# Patient Record
Sex: Female | Born: 1975
Health system: Southern US, Community
[De-identification: ages and names within clinical notes are randomized; demographics above are authoritative.]

## PROBLEM LIST (undated history)

## (undated) ENCOUNTER — Emergency Department (HOSPITAL_COMMUNITY): Disposition: A | Discharge status: Home / Self Care | Payer: Self-pay

## (undated) DIAGNOSIS — C4490 Unspecified malignant neoplasm of skin, unspecified: Secondary | ICD-10-CM

## (undated) DIAGNOSIS — F419 Anxiety disorder, unspecified: Secondary | ICD-10-CM

## (undated) DIAGNOSIS — K760 Fatty (change of) liver, not elsewhere classified: Secondary | ICD-10-CM

## (undated) DIAGNOSIS — F1010 Alcohol abuse, uncomplicated: Secondary | ICD-10-CM

## (undated) HISTORY — PX: OTHER SURGICAL HISTORY: SHX169

---

## 2002-07-31 ENCOUNTER — Ambulatory Visit (HOSPITAL_COMMUNITY): Admission: RE | Admit: 2002-07-31 | Discharge: 2002-07-31 | Payer: Self-pay | Admitting: Family Medicine

## 2002-07-31 ENCOUNTER — Encounter: Payer: Self-pay | Admitting: Family Medicine

## 2013-08-04 ENCOUNTER — Other Ambulatory Visit (HOSPITAL_COMMUNITY): Payer: Self-pay | Admitting: Family Medicine

## 2013-08-04 ENCOUNTER — Ambulatory Visit (HOSPITAL_COMMUNITY)
Admission: RE | Admit: 2013-08-04 | Discharge: 2013-08-04 | Disposition: A | Discharge status: Home / Self Care | Payer: BC Managed Care – PPO | Source: Ambulatory Visit | Attending: Family Medicine | Admitting: Family Medicine

## 2013-08-04 DIAGNOSIS — M779 Enthesopathy, unspecified: Secondary | ICD-10-CM

## 2013-08-04 DIAGNOSIS — M79609 Pain in unspecified limb: Secondary | ICD-10-CM | POA: Insufficient documentation

## 2013-08-04 DIAGNOSIS — M7730 Calcaneal spur, unspecified foot: Secondary | ICD-10-CM | POA: Insufficient documentation

## 2017-01-04 DIAGNOSIS — R6889 Other general symptoms and signs: Secondary | ICD-10-CM | POA: Diagnosis not present

## 2017-01-04 DIAGNOSIS — J040 Acute laryngitis: Secondary | ICD-10-CM | POA: Diagnosis not present

## 2017-01-04 DIAGNOSIS — J069 Acute upper respiratory infection, unspecified: Secondary | ICD-10-CM | POA: Diagnosis not present

## 2017-01-04 DIAGNOSIS — E782 Mixed hyperlipidemia: Secondary | ICD-10-CM | POA: Diagnosis not present

## 2017-01-04 DIAGNOSIS — Z1389 Encounter for screening for other disorder: Secondary | ICD-10-CM | POA: Diagnosis not present

## 2017-01-04 DIAGNOSIS — J209 Acute bronchitis, unspecified: Secondary | ICD-10-CM | POA: Diagnosis not present

## 2017-04-19 DIAGNOSIS — H52223 Regular astigmatism, bilateral: Secondary | ICD-10-CM | POA: Diagnosis not present

## 2017-04-19 DIAGNOSIS — H524 Presbyopia: Secondary | ICD-10-CM | POA: Diagnosis not present

## 2017-04-19 DIAGNOSIS — H5213 Myopia, bilateral: Secondary | ICD-10-CM | POA: Diagnosis not present

## 2017-10-21 DIAGNOSIS — J069 Acute upper respiratory infection, unspecified: Secondary | ICD-10-CM | POA: Diagnosis not present

## 2017-10-21 DIAGNOSIS — Z01411 Encounter for gynecological examination (general) (routine) with abnormal findings: Secondary | ICD-10-CM | POA: Diagnosis not present

## 2017-10-21 DIAGNOSIS — Z719 Counseling, unspecified: Secondary | ICD-10-CM | POA: Diagnosis not present

## 2017-10-21 DIAGNOSIS — Z124 Encounter for screening for malignant neoplasm of cervix: Secondary | ICD-10-CM | POA: Diagnosis not present

## 2017-10-21 DIAGNOSIS — D485 Neoplasm of uncertain behavior of skin: Secondary | ICD-10-CM | POA: Diagnosis not present

## 2017-10-21 DIAGNOSIS — Z1389 Encounter for screening for other disorder: Secondary | ICD-10-CM | POA: Diagnosis not present

## 2017-12-06 DIAGNOSIS — C4441 Basal cell carcinoma of skin of scalp and neck: Secondary | ICD-10-CM | POA: Diagnosis not present

## 2017-12-21 DIAGNOSIS — R569 Unspecified convulsions: Secondary | ICD-10-CM

## 2017-12-21 DIAGNOSIS — F19931 Other psychoactive substance use, unspecified with withdrawal delirium: Secondary | ICD-10-CM

## 2017-12-21 HISTORY — DX: Unspecified convulsions: R56.9

## 2017-12-21 HISTORY — DX: Other psychoactive substance use, unspecified with withdrawal delirium: F19.931

## 2018-01-06 DIAGNOSIS — Z1231 Encounter for screening mammogram for malignant neoplasm of breast: Secondary | ICD-10-CM | POA: Diagnosis not present

## 2018-03-21 HISTORY — PX: BASAL CELL CARCINOMA EXCISION: SHX1214

## 2018-03-24 DIAGNOSIS — C4441 Basal cell carcinoma of skin of scalp and neck: Secondary | ICD-10-CM | POA: Diagnosis not present

## 2018-05-21 HISTORY — PX: SKIN FULL THICKNESS GRAFT: SHX442

## 2018-06-09 NOTE — H&P (Signed)
  Subjective:     Patient ID: Martha Campbell is a 42 y.o. female.  HPI  Referred by Dr. Danielle Dess for evaluation wound scalp following Mohs surgery 4.4.19 for [facility] [address] and O-T rotation flap closure. Experienced some skin flap necrosis and noted exposure bone. Current wound care Xeroform. Accompanied by SO. Works as Librarian, academic in IT sales professional, reports is dusty environment. This is her first skin cancer diagnosis.  She is active smoker.  Review of Systems Remainder 12 point review negative    Objective:   Physical Exam  Constitutional: She is oriented to person, place, and time.  Cardiovascular: Normal rate, regular rhythm and normal heart sounds.   Pulmonary/Chest: Effort normal and breath sounds normal.  Neurological: She is alert and oriented to person, place, and time.  Skin:  Fitzpatrick 2, tan Minimal elastosis over medial arms  HEENT: open wound frontal scalp 2.4 x 2.2 cm exposed bone devoid of periosteum just behind anterior hairline, flap that remains with alopecia/thining. +sensation over vertex and occipital scalp. Able to raise brows symemtrically     Assessment:     S/p Mohs excision BCC scalp Open wound scalp    Plan:     Recommend coverage of bone with burring and skin graft. Given size can plan FTSG from right or left medial arm (patient is LHD). Reviewed donor site scar, bolster dressing over scalp, post procedure limitations. Reviewed risk failure graft esp in setting of active nicotine use. Counseled graft will have no hair and no sensation. In future once she is not smoking and the remainder scalp flap mature, soft could consider scalp rotation flap to address the alopecia from this wound. Pictures taken.   Irene Limbo, MD [facility] Plastic & Reconstructive Surgery [phone], pin [phone]

## 2018-06-10 ENCOUNTER — Other Ambulatory Visit: Payer: Self-pay

## 2018-06-10 ENCOUNTER — Encounter (HOSPITAL_BASED_OUTPATIENT_CLINIC_OR_DEPARTMENT_OTHER): Payer: Self-pay | Admitting: *Deleted

## 2018-06-16 ENCOUNTER — Ambulatory Visit (HOSPITAL_BASED_OUTPATIENT_CLINIC_OR_DEPARTMENT_OTHER)
Admission: RE | Admit: 2018-06-16 | Discharge: 2018-06-16 | Disposition: A | Discharge status: Home / Self Care | Payer: Commercial Managed Care - PPO | Source: Ambulatory Visit | Attending: Plastic Surgery | Admitting: Plastic Surgery

## 2018-06-16 ENCOUNTER — Other Ambulatory Visit: Payer: Self-pay

## 2018-06-16 ENCOUNTER — Ambulatory Visit (HOSPITAL_BASED_OUTPATIENT_CLINIC_OR_DEPARTMENT_OTHER): Payer: Commercial Managed Care - PPO | Admitting: Certified Registered"

## 2018-06-16 ENCOUNTER — Encounter (HOSPITAL_BASED_OUTPATIENT_CLINIC_OR_DEPARTMENT_OTHER): Payer: Self-pay | Admitting: Anesthesiology

## 2018-06-16 ENCOUNTER — Encounter (HOSPITAL_BASED_OUTPATIENT_CLINIC_OR_DEPARTMENT_OTHER)
Admission: RE | Disposition: A | Discharge status: Home / Self Care | Payer: Self-pay | Source: Ambulatory Visit | Attending: Plastic Surgery

## 2018-06-16 DIAGNOSIS — C4441 Basal cell carcinoma of skin of scalp and neck: Secondary | ICD-10-CM | POA: Insufficient documentation

## 2018-06-16 DIAGNOSIS — F17200 Nicotine dependence, unspecified, uncomplicated: Secondary | ICD-10-CM | POA: Diagnosis not present

## 2018-06-16 DIAGNOSIS — L578 Other skin changes due to chronic exposure to nonionizing radiation: Secondary | ICD-10-CM | POA: Diagnosis not present

## 2018-06-16 DIAGNOSIS — F419 Anxiety disorder, unspecified: Secondary | ICD-10-CM | POA: Diagnosis not present

## 2018-06-16 DIAGNOSIS — S0100XA Unspecified open wound of scalp, initial encounter: Secondary | ICD-10-CM | POA: Diagnosis not present

## 2018-06-16 DIAGNOSIS — Z483 Aftercare following surgery for neoplasm: Secondary | ICD-10-CM | POA: Diagnosis not present

## 2018-06-16 HISTORY — PX: SKIN FULL THICKNESS GRAFT: SHX442

## 2018-06-16 HISTORY — DX: Anxiety disorder, unspecified: F41.9

## 2018-06-16 SURGERY — APPLICATION, GRAFT, SKIN, FULL-THICKNESS
Anesthesia: General | Site: Head | Laterality: Right

## 2018-06-16 MED ORDER — FENTANYL CITRATE (PF) 100 MCG/2ML IJ SOLN: 25.0000 ug | INTRAMUSCULAR | Status: DC | PRN

## 2018-06-16 MED ORDER — CEFAZOLIN SODIUM-DEXTROSE 2-4 GM/100ML-% IV SOLN
2.0000 g | INTRAVENOUS | Status: AC
  Administered 2018-06-16: 2 g via INTRAVENOUS

## 2018-06-16 MED ORDER — DEXAMETHASONE SODIUM PHOSPHATE 10 MG/ML IJ SOLN
INTRAMUSCULAR | Status: AC
  Filled 2018-06-16: qty 1

## 2018-06-16 MED ORDER — CEFAZOLIN SODIUM-DEXTROSE 2-4 GM/100ML-% IV SOLN
INTRAVENOUS | Status: AC
  Filled 2018-06-16: qty 100

## 2018-06-16 MED ORDER — ONDANSETRON HCL 4 MG/2ML IJ SOLN
INTRAMUSCULAR | Status: AC
  Filled 2018-06-16: qty 2

## 2018-06-16 MED ORDER — ONDANSETRON HCL 4 MG/2ML IJ SOLN
4.0000 mg | Freq: Once | INTRAMUSCULAR | Status: AC | PRN
  Administered 2018-06-16: 4 mg via INTRAVENOUS

## 2018-06-16 MED ORDER — BACITRACIN ZINC 500 UNIT/GM EX OINT
TOPICAL_OINTMENT | CUTANEOUS | Status: AC
  Filled 2018-06-16: qty 28.35

## 2018-06-16 MED ORDER — MIDAZOLAM HCL 2 MG/2ML IJ SOLN
1.0000 mg | INTRAMUSCULAR | Status: DC | PRN
  Administered 2018-06-16: 2 mg via INTRAVENOUS

## 2018-06-16 MED ORDER — PROPOFOL 10 MG/ML IV BOLUS
INTRAVENOUS | Status: DC | PRN
  Administered 2018-06-16: 150 mg via INTRAVENOUS

## 2018-06-16 MED ORDER — OXYCODONE HCL 5 MG/5ML PO SOLN: 5.0000 mg | Freq: Once | ORAL | Status: DC | PRN

## 2018-06-16 MED ORDER — LIDOCAINE 2% (20 MG/ML) 5 ML SYRINGE
INTRAMUSCULAR | Status: DC | PRN
  Administered 2018-06-16: 80 mg via INTRAVENOUS

## 2018-06-16 MED ORDER — PROPOFOL 10 MG/ML IV BOLUS
INTRAVENOUS | Status: AC
  Filled 2018-06-16: qty 20

## 2018-06-16 MED ORDER — FENTANYL CITRATE (PF) 100 MCG/2ML IJ SOLN: 50.0000 ug | INTRAMUSCULAR | Status: DC | PRN

## 2018-06-16 MED ORDER — MIDAZOLAM HCL 2 MG/2ML IJ SOLN
INTRAMUSCULAR | Status: AC
  Filled 2018-06-16: qty 2

## 2018-06-16 MED ORDER — EPHEDRINE SULFATE 50 MG/ML IJ SOLN
INTRAMUSCULAR | Status: DC | PRN
  Administered 2018-06-16 (×2): 10 mg via INTRAVENOUS

## 2018-06-16 MED ORDER — BUPIVACAINE-EPINEPHRINE 0.25% -1:200000 IJ SOLN
INTRAMUSCULAR | Status: DC | PRN
  Administered 2018-06-16: 10 mL

## 2018-06-16 MED ORDER — SCOPOLAMINE 1 MG/3DAYS TD PT72: 1.0000 | MEDICATED_PATCH | Freq: Once | TRANSDERMAL | Status: DC | PRN

## 2018-06-16 MED ORDER — ACETAMINOPHEN 325 MG PO TABS: 325.0000 mg | ORAL_TABLET | ORAL | Status: DC | PRN

## 2018-06-16 MED ORDER — BUPIVACAINE-EPINEPHRINE (PF) 0.25% -1:200000 IJ SOLN
INTRAMUSCULAR | Status: AC
  Filled 2018-06-16: qty 30

## 2018-06-16 MED ORDER — LIDOCAINE-EPINEPHRINE 1 %-1:100000 IJ SOLN
INTRAMUSCULAR | Status: AC
  Filled 2018-06-16: qty 1

## 2018-06-16 MED ORDER — LACTATED RINGERS IV SOLN
INTRAVENOUS | Status: DC
  Administered 2018-06-16: 11:00:00 via INTRAVENOUS

## 2018-06-16 MED ORDER — FENTANYL CITRATE (PF) 100 MCG/2ML IJ SOLN
INTRAMUSCULAR | Status: AC
  Filled 2018-06-16: qty 2

## 2018-06-16 MED ORDER — MEPERIDINE HCL 25 MG/ML IJ SOLN
6.2500 mg | INTRAMUSCULAR | Status: DC | PRN
Start: 2018-06-16 — End: 2018-06-16

## 2018-06-16 MED ORDER — LIDOCAINE HCL (CARDIAC) PF 100 MG/5ML IV SOSY
PREFILLED_SYRINGE | INTRAVENOUS | Status: AC
  Filled 2018-06-16: qty 5

## 2018-06-16 MED ORDER — OXYCODONE HCL 5 MG PO TABS: 5.0000 mg | ORAL_TABLET | Freq: Once | ORAL | Status: DC | PRN

## 2018-06-16 MED ORDER — ACETAMINOPHEN 160 MG/5ML PO SOLN: 325.0000 mg | ORAL | Status: DC | PRN

## 2018-06-16 MED ORDER — HYDROCODONE-ACETAMINOPHEN 5-325 MG PO TABS: 1.0000 | ORAL_TABLET | ORAL | 0 refills | Status: DC | PRN

## 2018-06-16 SURGICAL SUPPLY — 55 items
BENZOIN TINCTURE PRP APPL 2/3 (GAUZE/BANDAGES/DRESSINGS) IMPLANT
BLADE CLIPPER SURG (BLADE) IMPLANT
BLADE SURG 15 STRL LF DISP TIS (BLADE) ×2 IMPLANT
BLADE SURG 15 STRL SS (BLADE) ×1
BRUSH SCRUB EZ PLAIN DRY (MISCELLANEOUS) ×3 IMPLANT
BUR EGG 3PK/BX (BURR) IMPLANT
BUR OVAL CARBIDE 4.0 (BURR) ×3 IMPLANT
BUR PEAR (BURR) IMPLANT
CANISTER SUCT 1200ML W/VALVE (MISCELLANEOUS) ×3 IMPLANT
CLEANER CAUTERY TIP 5X5 PAD (MISCELLANEOUS) IMPLANT
COVER BACK TABLE 60X90IN (DRAPES) ×3 IMPLANT
COVER MAYO STAND STRL (DRAPES) ×3 IMPLANT
DRAPE U-SHAPE 76X120 STRL (DRAPES) ×3 IMPLANT
ELECT COATED BLADE 2.86 ST (ELECTRODE) ×3 IMPLANT
ELECT NEEDLE BLADE 2-5/6 (NEEDLE) IMPLANT
ELECT REM PT RETURN 9FT ADLT (ELECTROSURGICAL) ×3
ELECTRODE REM PT RTRN 9FT ADLT (ELECTROSURGICAL) ×2 IMPLANT
GAUZE SPONGE 4X4 12PLY STRL LF (GAUZE/BANDAGES/DRESSINGS) IMPLANT
GAUZE SPONGE 4X4 16PLY XRAY LF (GAUZE/BANDAGES/DRESSINGS) IMPLANT
GAUZE XEROFORM 1X8 LF (GAUZE/BANDAGES/DRESSINGS) IMPLANT
GLOVE BIO SURGEON STRL SZ 6 (GLOVE) ×6 IMPLANT
GLOVE BIO SURGEON STRL SZ 6.5 (GLOVE) ×3 IMPLANT
GLOVE BIOGEL PI IND STRL 6.5 (GLOVE) ×2 IMPLANT
GLOVE BIOGEL PI IND STRL 7.0 (GLOVE) ×2 IMPLANT
GLOVE BIOGEL PI IND STRL 8 (GLOVE) ×2 IMPLANT
GLOVE BIOGEL PI INDICATOR 6.5 (GLOVE) ×1
GLOVE BIOGEL PI INDICATOR 7.0 (GLOVE) ×1
GLOVE BIOGEL PI INDICATOR 8 (GLOVE) ×1
GLOVE EXAM NITRILE PF LG BLUE (GLOVE) IMPLANT
GOWN STRL REUS W/ TWL LRG LVL3 (GOWN DISPOSABLE) ×4 IMPLANT
GOWN STRL REUS W/TWL LRG LVL3 (GOWN DISPOSABLE) ×2
NEEDLE BLUNT 17GA (NEEDLE) IMPLANT
NEEDLE HYPO 25X1 1.5 SAFETY (NEEDLE) IMPLANT
NEEDLE HYPO 30GX1 BEV (NEEDLE) IMPLANT
NEEDLE PRECISIONGLIDE 27X1.5 (NEEDLE) ×3 IMPLANT
NS IRRIG 1000ML POUR BTL (IV SOLUTION) ×3 IMPLANT
PACK BASIN DAY SURGERY FS (CUSTOM PROCEDURE TRAY) ×3 IMPLANT
PAD CLEANER CAUTERY TIP 5X5 (MISCELLANEOUS)
PENCIL BUTTON HOLSTER BLD 10FT (ELECTRODE) ×3 IMPLANT
SHEET MEDIUM DRAPE 40X70 STRL (DRAPES) ×3 IMPLANT
SPONGE GAUZE 2X2 8PLY STRL LF (GAUZE/BANDAGES/DRESSINGS) IMPLANT
STAPLER VISISTAT 35W (STAPLE) ×3 IMPLANT
STRIP CLOSURE SKIN 1/2X4 (GAUZE/BANDAGES/DRESSINGS) IMPLANT
SUCTION FRAZIER HANDLE 10FR (MISCELLANEOUS) ×1
SUCTION TUBE FRAZIER 10FR DISP (MISCELLANEOUS) ×2 IMPLANT
SUT CHROMIC 4 0 P 3 18 (SUTURE) IMPLANT
SUT MNCRL AB 4-0 PS2 18 (SUTURE) ×3 IMPLANT
SUT MON AB 5-0 P3 18 (SUTURE) IMPLANT
SUT PLAIN 5 0 P 3 18 (SUTURE) IMPLANT
SUT VICRYL 4-0 PS2 18IN ABS (SUTURE) ×3 IMPLANT
SYR 20CC LL (SYRINGE) IMPLANT
SYR BULB 3OZ (MISCELLANEOUS) ×3 IMPLANT
SYR CONTROL 10ML LL (SYRINGE) ×3 IMPLANT
TRAY DSU PREP LF (CUSTOM PROCEDURE TRAY) ×3 IMPLANT
TUBE CONNECTING 20X1/4 (TUBING) ×3 IMPLANT

## 2018-06-16 NOTE — Anesthesia Procedure Notes (Signed)
Procedure Name: LMA Insertion Date/Time: 06/16/2018 10:58 AM Performed by: Maryella Shivers, CRNA Pre-anesthesia Checklist: Patient identified, Emergency Drugs available, Suction available and Patient being monitored Patient Re-evaluated:Patient Re-evaluated prior to induction Oxygen Delivery Method: Circle system utilized Preoxygenation: Pre-oxygenation with 100% oxygen Induction Type: IV induction Ventilation: Mask ventilation without difficulty LMA: LMA inserted LMA Size: 4.0 Number of attempts: 1 Airway Equipment and Method: Bite block Placement Confirmation: positive ETCO2 Tube secured with: Tape Dental Injury: Teeth and Oropharynx as per pre-operative assessment

## 2018-06-16 NOTE — Anesthesia Preprocedure Evaluation (Signed)
Anesthesia Evaluation  Patient identified by MRN, date of birth, ID band Patient awake    Reviewed: Allergy & Precautions, H&P , NPO status , Patient's Chart, lab work & pertinent test results, reviewed documented beta blocker date and time   Airway Mallampati: II  TM Distance: >3 FB Neck ROM: full    Dental no notable dental hx.    Pulmonary Current Smoker,    Pulmonary exam normal breath sounds clear to auscultation       Cardiovascular Exercise Tolerance: Good  Rhythm:regular Rate:Normal     Neuro/Psych Anxiety    GI/Hepatic   Endo/Other    Renal/GU   negative genitourinary   Musculoskeletal   Abdominal   Peds  Hematology negative hematology ROS (+)   Anesthesia Other Findings   Reproductive/Obstetrics                             Anesthesia Physical Anesthesia Plan  ASA: II  Anesthesia Plan: General   Post-op Pain Management:    Induction: Intravenous  PONV Risk Score and Plan: 3 and Treatment may vary due to age or medical condition, Ondansetron and Dexamethasone  Airway Management Planned: LMA and Oral ETT  Additional Equipment:   Intra-op Plan:   Post-operative Plan:   Informed Consent: I have reviewed the patients History and Physical, chart, labs and discussed the procedure including the risks, benefits and alternatives for the proposed anesthesia with the patient or authorized representative who has indicated his/her understanding and acceptance.   Dental Advisory Given  Plan Discussed with: CRNA, Anesthesiologist and Surgeon  Anesthesia Plan Comments: ( )        Anesthesia Quick Evaluation

## 2018-06-16 NOTE — Anesthesia Postprocedure Evaluation (Signed)
Anesthesia Post Note  Patient: Martha Campbell  Procedure(s) Performed: surgical prep scalp wound 16cm2, full thickness skin graft from right arm to scalp (Right Head)     Patient location during evaluation: PACU Anesthesia Type: General Level of consciousness: awake and alert Pain management: pain level controlled Vital Signs Assessment: post-procedure vital signs reviewed and stable Respiratory status: spontaneous breathing, nonlabored ventilation, respiratory function stable and patient connected to nasal cannula oxygen Cardiovascular status: blood pressure returned to baseline and stable Postop Assessment: no apparent nausea or vomiting Anesthetic complications: no    Last Vitals:  Vitals:   06/16/18 1147 06/16/18 1148  BP: 96/80   Pulse:  95  Resp:  14  Temp:  36.6 C  SpO2:  100%    Last Pain:  Vitals:   06/16/18 1148  TempSrc:   PainSc: 0-No pain                 Icis Budreau

## 2018-06-16 NOTE — Interval H&P Note (Signed)
History and Physical Interval Note:  06/16/2018 10:40 AM  Martha Campbell  has presented today for surgery, with the diagnosis of open wound scalp with complication, hx BCC excision  The various methods of treatment have been discussed with the patient and family. After consideration of risks, benefits and other options for treatment, the patient has consented to  Procedure(s): surgical prep scalp wound 16cm2, full thickness skin graft from right or left arm to scalp (N/A) as a surgical intervention .  The patient's history has been reviewed, patient examined, no change in status, stable for surgery.  I have reviewed the patient's chart and labs.  Questions were answered to the patient's satisfaction.     Darcel Frane

## 2018-06-16 NOTE — Discharge Instructions (Signed)

## 2018-06-16 NOTE — Op Note (Signed)
Operative Note   DATE OF OPERATION: 6.27.19   LOCATION: [facility]-outpatient  SURGICAL DIVISION: Plastic Surgery  PREOPERATIVE DIAGNOSES:  1. S/p Mohs resection scalp 2. Open wound scalp with complication  POSTOPERATIVE DIAGNOSES:  same  PROCEDURE:  1. Surgical preparation for grafting scalp 3 cm2 2. Full thickness skin graft from right arm to scalp 3 cm2  SURGEON: Irene Limbo MD MBA  ASSISTANT: none  ANESTHESIA:  General.   EBL: minimal  COMPLICATIONS: None immediate.   INDICATIONS FOR PROCEDURE:  The patient, Martha Campbell, is a 42 y.o. female born on Mar 11, 1976, is here for skin graft to anterior scalp wound following Mohs resection basal cell carcinoma.   FINDINGS: 2.5 cm scalp defect full thickness with exposure bone  DESCRIPTION OF PROCEDURE:  The patient's operative site was marked with the patient in the preoperative area. The patient was taken to the operating room. SCDs were placed and IV antibiotics were given. The patient's operative site was prepped and draped in a sterile fashion. A time out was performed and all information was confirmed to be correct. Local anesthetic infiltrated surrounding wound, to perform bilateral supraorbital n blocks, and over right medial arm donor site. Burr used to prepare bone for grafting and completed until punctate bleeding from bone obtained 3 cm2. Elliptical excision skin full thickness right medial arm completed. Donor site closed with 4-0 interrupted vicryl in dermis and running 4-0 monocryl subcuticular. Dermabond applied. Skin graft prepared with removal fat to dermis. Graft inset to wound with 4-0 chromic. Adaptic and sterile sponge applied as bolster with staples.   The patient was allowed to wake from anesthesia, extubated and taken to the recovery room in satisfactory condition.   SPECIMENS: none  DRAINS: none  Irene Limbo, MD [facility] Plastic & Reconstructive Surgery [phone], pin [phone]

## 2018-06-16 NOTE — Transfer of Care (Signed)
Immediate Anesthesia Transfer of Care Note  Patient: Martha Campbell  Procedure(s) Performed: surgical prep scalp wound 16cm2, full thickness skin graft from right arm to scalp (Right Head)  Patient Location: PACU  Anesthesia Type:General  Level of Consciousness: sedated  Airway & Oxygen Therapy: Patient Spontanous Breathing and Patient connected to face mask oxygen  Post-op Assessment: Report given to RN and Post -op Vital signs reviewed and stable  Post vital signs: Reviewed and stable  Last Vitals:  Vitals Value Taken Time  BP 96/80 06/16/2018 11:47 AM  Temp    Pulse 95 06/16/2018 11:48 AM  Resp 14 06/16/2018 11:48 AM  SpO2 100 % 06/16/2018 11:48 AM    Last Pain:  Vitals:   06/16/18 1015  TempSrc: Oral         Complications: No apparent anesthesia complications

## 2018-06-17 ENCOUNTER — Encounter (HOSPITAL_BASED_OUTPATIENT_CLINIC_OR_DEPARTMENT_OTHER): Payer: Self-pay | Admitting: Plastic Surgery

## 2018-06-20 ENCOUNTER — Encounter (HOSPITAL_BASED_OUTPATIENT_CLINIC_OR_DEPARTMENT_OTHER): Payer: Self-pay | Admitting: Plastic Surgery

## 2018-12-05 DIAGNOSIS — Z6827 Body mass index (BMI) 27.0-27.9, adult: Secondary | ICD-10-CM | POA: Diagnosis not present

## 2018-12-05 DIAGNOSIS — Z1389 Encounter for screening for other disorder: Secondary | ICD-10-CM | POA: Diagnosis not present

## 2018-12-05 DIAGNOSIS — Z0000 Encounter for general adult medical examination without abnormal findings: Secondary | ICD-10-CM | POA: Diagnosis not present

## 2019-12-25 ENCOUNTER — Emergency Department (HOSPITAL_COMMUNITY): Payer: Commercial Managed Care - PPO

## 2019-12-25 ENCOUNTER — Encounter (HOSPITAL_COMMUNITY): Payer: Self-pay | Admitting: Emergency Medicine

## 2019-12-25 ENCOUNTER — Inpatient Hospital Stay (HOSPITAL_COMMUNITY)
Admission: EM | Admit: 2019-12-25 | Discharge: 2019-12-30 | DRG: 897 | Disposition: A | Discharge status: Home / Self Care | Payer: Commercial Managed Care - PPO | Attending: Family Medicine | Admitting: Family Medicine

## 2019-12-25 ENCOUNTER — Other Ambulatory Visit: Payer: Self-pay

## 2019-12-25 ENCOUNTER — Inpatient Hospital Stay (HOSPITAL_COMMUNITY): Payer: Commercial Managed Care - PPO

## 2019-12-25 ENCOUNTER — Ambulatory Visit (INDEPENDENT_AMBULATORY_CARE_PROVIDER_SITE_OTHER)
Admission: EM | Admit: 2019-12-25 | Discharge: 2019-12-25 | Disposition: A | Discharge status: Home / Self Care | Payer: Commercial Managed Care - PPO | Source: Home / Self Care

## 2019-12-25 DIAGNOSIS — R000 Tachycardia, unspecified: Secondary | ICD-10-CM

## 2019-12-25 DIAGNOSIS — Z781 Physical restraint status: Secondary | ICD-10-CM | POA: Diagnosis not present

## 2019-12-25 DIAGNOSIS — G4089 Other seizures: Secondary | ICD-10-CM | POA: Diagnosis present

## 2019-12-25 DIAGNOSIS — F419 Anxiety disorder, unspecified: Secondary | ICD-10-CM | POA: Diagnosis present

## 2019-12-25 DIAGNOSIS — D6959 Other secondary thrombocytopenia: Secondary | ICD-10-CM | POA: Diagnosis present

## 2019-12-25 DIAGNOSIS — E876 Hypokalemia: Secondary | ICD-10-CM | POA: Diagnosis present

## 2019-12-25 DIAGNOSIS — F17210 Nicotine dependence, cigarettes, uncomplicated: Secondary | ICD-10-CM | POA: Diagnosis present

## 2019-12-25 DIAGNOSIS — R0781 Pleurodynia: Secondary | ICD-10-CM | POA: Diagnosis not present

## 2019-12-25 DIAGNOSIS — E162 Hypoglycemia, unspecified: Secondary | ICD-10-CM | POA: Diagnosis present

## 2019-12-25 DIAGNOSIS — Z720 Tobacco use: Secondary | ICD-10-CM | POA: Diagnosis present

## 2019-12-25 DIAGNOSIS — Z85828 Personal history of other malignant neoplasm of skin: Secondary | ICD-10-CM | POA: Diagnosis not present

## 2019-12-25 DIAGNOSIS — F10939 Alcohol use, unspecified with withdrawal, unspecified: Secondary | ICD-10-CM

## 2019-12-25 DIAGNOSIS — Z20822 Contact with and (suspected) exposure to covid-19: Secondary | ICD-10-CM | POA: Diagnosis present

## 2019-12-25 DIAGNOSIS — F10231 Alcohol dependence with withdrawal delirium: Secondary | ICD-10-CM | POA: Diagnosis present

## 2019-12-25 DIAGNOSIS — F10931 Alcohol use, unspecified with withdrawal delirium: Secondary | ICD-10-CM | POA: Diagnosis present

## 2019-12-25 DIAGNOSIS — K709 Alcoholic liver disease, unspecified: Secondary | ICD-10-CM | POA: Diagnosis present

## 2019-12-25 DIAGNOSIS — Z23 Encounter for immunization: Secondary | ICD-10-CM

## 2019-12-25 DIAGNOSIS — R9431 Abnormal electrocardiogram [ECG] [EKG]: Secondary | ICD-10-CM

## 2019-12-25 DIAGNOSIS — F1010 Alcohol abuse, uncomplicated: Secondary | ICD-10-CM | POA: Diagnosis present

## 2019-12-25 DIAGNOSIS — R569 Unspecified convulsions: Secondary | ICD-10-CM

## 2019-12-25 DIAGNOSIS — E871 Hypo-osmolality and hyponatremia: Secondary | ICD-10-CM | POA: Diagnosis present

## 2019-12-25 DIAGNOSIS — F10239 Alcohol dependence with withdrawal, unspecified: Secondary | ICD-10-CM

## 2019-12-25 HISTORY — DX: Unspecified malignant neoplasm of skin, unspecified: C44.90

## 2019-12-25 LAB — BASIC METABOLIC PANEL
Anion gap: >20 — ABNORMAL HIGH (ref 5–15)
Anion gap: 15 (ref 5–15)
BUN: 10 mg/dL (ref 6–20)
BUN: 8 mg/dL (ref 6–20)
CO2: 28 mmol/L (ref 22–32)
CO2: 26 mmol/L (ref 22–32)
Calcium: 10.2 mg/dL (ref 8.9–10.3)
Calcium: 8.8 mg/dL — ABNORMAL LOW (ref 8.9–10.3)
Chloride: 74 mmol/L — ABNORMAL LOW (ref 98–111)
Chloride: 91 mmol/L — ABNORMAL LOW (ref 98–111)
Creatinine, Ser: 0.67 mg/dL (ref 0.44–1.00)
Creatinine, Ser: 0.47 mg/dL (ref 0.44–1.00)
GFR calc Af Amer: >60 mL/min (ref >60)
GFR calc Af Amer: >60 mL/min (ref >60)
GFR calc non Af Amer: >60 mL/min (ref >60)
GFR calc non Af Amer: >60 mL/min (ref >60)
Glucose, Bld: 124 mg/dL — ABNORMAL HIGH (ref 70–99)
Glucose, Bld: 69 mg/dL — ABNORMAL LOW (ref 70–99)
Potassium: 2.1 mmol/L — CL (ref 3.5–5.1)
Potassium: 3.8 mmol/L (ref 3.5–5.1)
Sodium: 125 mmol/L — ABNORMAL LOW (ref 135–145)
Sodium: 132 mmol/L — ABNORMAL LOW (ref 135–145)

## 2019-12-25 LAB — URINALYSIS, ROUTINE W REFLEX MICROSCOPIC
Bacteria, UA: NONE SEEN
Glucose, UA: NEGATIVE mg/dL
Ketones, ur: 5 mg/dL — AB
Leukocytes,Ua: NEGATIVE
Nitrite: NEGATIVE
Protein, ur: >=300 mg/dL — AB
Specific Gravity, Urine: 1.037 — ABNORMAL HIGH (ref 1.005–1.030)
pH: 5 (ref 5.0–8.0)

## 2019-12-25 LAB — MAGNESIUM: Magnesium: 1.4 mg/dL — ABNORMAL LOW (ref 1.7–2.4)

## 2019-12-25 LAB — CBC
HCT: 49.2 % — ABNORMAL HIGH (ref 36.0–46.0)
Hemoglobin: 18.6 g/dL — ABNORMAL HIGH (ref 12.0–15.0)
MCH: 36.9 pg — ABNORMAL HIGH (ref 26.0–34.0)
MCHC: 37.8 g/dL — ABNORMAL HIGH (ref 30.0–36.0)
MCV: 97.6 fL (ref 80.0–100.0)
Platelets: 39 10*3/uL — ABNORMAL LOW (ref 150–400)
RBC: 5.04 MIL/uL (ref 3.87–5.11)
RDW: 13.6 % (ref 11.5–15.5)
WBC: 4.7 10*3/uL (ref 4.0–10.5)
nRBC: 0 % (ref 0.0–0.2)

## 2019-12-25 LAB — HEPATIC FUNCTION PANEL
ALT: 78 U/L — ABNORMAL HIGH (ref 0–44)
AST: 294 U/L — ABNORMAL HIGH (ref 15–41)
Albumin: 3.5 g/dL (ref 3.5–5.0)
Alkaline Phosphatase: 114 U/L (ref 38–126)
Bilirubin, Direct: 1.2 mg/dL — ABNORMAL HIGH (ref 0.0–0.2)
Indirect Bilirubin: 2.2 mg/dL — ABNORMAL HIGH (ref 0.3–0.9)
Total Bilirubin: 3.4 mg/dL — ABNORMAL HIGH (ref 0.3–1.2)
Total Protein: 6.6 g/dL (ref 6.5–8.1)

## 2019-12-25 LAB — D-DIMER, QUANTITATIVE: D-Dimer, Quant: 0.59 ug/mL-FEU — ABNORMAL HIGH (ref 0.00–0.50)

## 2019-12-25 LAB — PROTIME-INR
INR: 0.9 (ref 0.8–1.2)
Prothrombin Time: 11.7 seconds (ref 11.4–15.2)

## 2019-12-25 LAB — RAPID URINE DRUG SCREEN, HOSP PERFORMED
Amphetamines: NOT DETECTED
Barbiturates: NOT DETECTED
Benzodiazepines: NOT DETECTED
Cocaine: NOT DETECTED
Opiates: NOT DETECTED

## 2019-12-25 LAB — LACTIC ACID, PLASMA
Lactic Acid, Venous: 1.9 mmol/L (ref 0.5–1.9)
Lactic Acid, Venous: 1.9 mmol/L (ref 0.5–1.9)

## 2019-12-25 LAB — TROPONIN I (HIGH SENSITIVITY)
Troponin I (High Sensitivity): 8 ng/L (ref <18)
Troponin I (High Sensitivity): 11 ng/L (ref <18)

## 2019-12-25 LAB — SARS CORONAVIRUS 2 (TAT 6-24 HRS): SARS Coronavirus 2: NEGATIVE

## 2019-12-25 LAB — PHOSPHORUS: Phosphorus: 2.7 mg/dL (ref 2.5–4.6)

## 2019-12-25 LAB — CBG MONITORING, ED: Glucose-Capillary: 128 mg/dL — ABNORMAL HIGH (ref 70–99)

## 2019-12-25 MED ORDER — NICOTINE 14 MG/24HR TD PT24
14.0000 mg | MEDICATED_PATCH | Freq: Every day | TRANSDERMAL | Status: DC
  Administered 2019-12-26 – 2019-12-30 (×5): 14 mg via TRANSDERMAL
  Filled 2019-12-25 (×5): qty 1

## 2019-12-25 MED ORDER — ONDANSETRON HCL 4 MG/2ML IJ SOLN: 4.0000 mg | Freq: Four times a day (QID) | INTRAMUSCULAR | Status: DC | PRN

## 2019-12-25 MED ORDER — SODIUM CHLORIDE 0.9 % IV SOLN: 250.0000 mL | INTRAVENOUS | Status: DC | PRN

## 2019-12-25 MED ORDER — MAGNESIUM SULFATE 4 GM/100ML IV SOLN
4.0000 g | Freq: Once | INTRAVENOUS | Status: AC
  Administered 2019-12-25: 4 g via INTRAVENOUS
  Filled 2019-12-25: qty 100

## 2019-12-25 MED ORDER — TRAZODONE HCL 50 MG PO TABS
50.0000 mg | ORAL_TABLET | Freq: Every evening | ORAL | Status: DC | PRN
  Administered 2019-12-26 – 2019-12-29 (×3): 50 mg via ORAL
  Filled 2019-12-25 (×3): qty 1

## 2019-12-25 MED ORDER — POTASSIUM CHLORIDE 10 MEQ/100ML IV SOLN
10.0000 meq | INTRAVENOUS | Status: AC
  Administered 2019-12-25 (×4): 10 meq via INTRAVENOUS
  Filled 2019-12-25 (×4): qty 100

## 2019-12-25 MED ORDER — LORAZEPAM 2 MG/ML IJ SOLN
0.0000 mg | Freq: Four times a day (QID) | INTRAMUSCULAR | Status: DC
  Administered 2019-12-26 (×2): 1 mg via INTRAVENOUS
  Administered 2019-12-27: 05:00:00 2 mg via INTRAVENOUS
  Filled 2019-12-25: qty 1
  Filled 2019-12-25: qty 2
  Filled 2019-12-25: qty 1

## 2019-12-25 MED ORDER — LORATADINE 10 MG PO TABS
10.0000 mg | ORAL_TABLET | Freq: Every day | ORAL | Status: DC
  Administered 2019-12-25 – 2019-12-30 (×5): 10 mg via ORAL
  Filled 2019-12-25 (×6): qty 1

## 2019-12-25 MED ORDER — SODIUM CHLORIDE 0.9 % IV SOLN: INTRAVENOUS | Status: DC

## 2019-12-25 MED ORDER — POTASSIUM CHLORIDE CRYS ER 20 MEQ PO TBCR
60.0000 meq | EXTENDED_RELEASE_TABLET | Freq: Once | ORAL | Status: AC
  Administered 2019-12-25: 60 meq via ORAL
  Filled 2019-12-25: qty 3

## 2019-12-25 MED ORDER — ADULT MULTIVITAMIN W/MINERALS CH
1.0000 | ORAL_TABLET | Freq: Every day | ORAL | Status: DC
  Administered 2019-12-26 – 2019-12-30 (×4): 1 via ORAL
  Filled 2019-12-25 (×5): qty 1

## 2019-12-25 MED ORDER — THIAMINE HCL 100 MG PO TABS
100.0000 mg | ORAL_TABLET | Freq: Every day | ORAL | Status: DC
  Administered 2019-12-26 – 2019-12-30 (×3): 100 mg via ORAL
  Filled 2019-12-25 (×4): qty 1

## 2019-12-25 MED ORDER — SODIUM CHLORIDE 0.9% FLUSH
3.0000 mL | Freq: Once | INTRAVENOUS | Status: AC
  Administered 2019-12-25: 3 mL via INTRAVENOUS

## 2019-12-25 MED ORDER — THIAMINE HCL 100 MG/ML IJ SOLN
100.0000 mg | Freq: Once | INTRAMUSCULAR | Status: AC
  Administered 2019-12-25: 14:00:00 100 mg via INTRAVENOUS
  Filled 2019-12-25: qty 2

## 2019-12-25 MED ORDER — LORAZEPAM 2 MG/ML IJ SOLN
0.0000 mg | Freq: Two times a day (BID) | INTRAMUSCULAR | Status: DC
  Filled 2019-12-25: qty 1

## 2019-12-25 MED ORDER — LORAZEPAM 2 MG/ML IJ SOLN
1.0000 mg | INTRAMUSCULAR | Status: DC | PRN
  Administered 2019-12-26 (×2): 1 mg via INTRAVENOUS
  Administered 2019-12-26 (×2): 2 mg via INTRAVENOUS
  Administered 2019-12-26: 1 mg via INTRAVENOUS
  Administered 2019-12-27 (×3): 2 mg via INTRAVENOUS
  Filled 2019-12-25 (×8): qty 1

## 2019-12-25 MED ORDER — THIAMINE HCL 100 MG/ML IJ SOLN
INTRAVENOUS | Status: DC
  Filled 2019-12-25: qty 2000

## 2019-12-25 MED ORDER — LORAZEPAM 2 MG/ML IJ SOLN
1.0000 mg | Freq: Once | INTRAMUSCULAR | Status: AC
  Administered 2019-12-25: 1 mg via INTRAVENOUS
  Filled 2019-12-25: qty 1

## 2019-12-25 MED ORDER — ASPIRIN 81 MG PO CHEW
324.0000 mg | CHEWABLE_TABLET | Freq: Once | ORAL | Status: AC
  Administered 2019-12-25: 324 mg via ORAL

## 2019-12-25 MED ORDER — LORAZEPAM 1 MG PO TABS: 1.0000 mg | ORAL_TABLET | ORAL | Status: DC | PRN

## 2019-12-25 MED ORDER — ALBUTEROL SULFATE (2.5 MG/3ML) 0.083% IN NEBU: 2.5000 mg | INHALATION_SOLUTION | RESPIRATORY_TRACT | Status: DC | PRN

## 2019-12-25 MED ORDER — LORAZEPAM 2 MG/ML IJ SOLN
1.0000 mg | Freq: Once | INTRAMUSCULAR | Status: AC
  Administered 2019-12-25: 14:00:00 1 mg via INTRAVENOUS
  Filled 2019-12-25: qty 1

## 2019-12-25 MED ORDER — THIAMINE HCL 100 MG/ML IJ SOLN
100.0000 mg | Freq: Every day | INTRAMUSCULAR | Status: DC
  Administered 2019-12-27 – 2019-12-29 (×2): 100 mg via INTRAVENOUS
  Filled 2019-12-25 (×2): qty 2

## 2019-12-25 MED ORDER — FOLIC ACID 5 MG/ML IJ SOLN
1.0000 mg | Freq: Once | INTRAMUSCULAR | Status: AC
  Administered 2019-12-25: 1 mg via INTRAVENOUS
  Filled 2019-12-25: qty 0.2

## 2019-12-25 MED ORDER — POTASSIUM CHLORIDE CRYS ER 20 MEQ PO TBCR
40.0000 meq | EXTENDED_RELEASE_TABLET | ORAL | Status: AC
  Administered 2019-12-25 (×2): 40 meq via ORAL
  Filled 2019-12-25 (×2): qty 2

## 2019-12-25 MED ORDER — FOLIC ACID 1 MG PO TABS
1.0000 mg | ORAL_TABLET | Freq: Every day | ORAL | Status: DC
  Administered 2019-12-26 – 2019-12-30 (×4): 1 mg via ORAL
  Filled 2019-12-25 (×5): qty 1

## 2019-12-25 MED ORDER — HEPARIN SODIUM (PORCINE) 5000 UNIT/ML IJ SOLN: 5000.0000 [IU] | Freq: Three times a day (TID) | INTRAMUSCULAR | Status: DC

## 2019-12-25 MED ORDER — SODIUM CHLORIDE 0.9% FLUSH
3.0000 mL | Freq: Two times a day (BID) | INTRAVENOUS | Status: DC
  Administered 2019-12-25 – 2019-12-30 (×3): 3 mL via INTRAVENOUS

## 2019-12-25 MED ORDER — ACETAMINOPHEN 325 MG PO TABS: 650.0000 mg | ORAL_TABLET | Freq: Four times a day (QID) | ORAL | Status: DC | PRN

## 2019-12-25 MED ORDER — ACETAMINOPHEN 650 MG RE SUPP: 650.0000 mg | Freq: Four times a day (QID) | RECTAL | Status: DC | PRN

## 2019-12-25 MED ORDER — THIAMINE HCL 100 MG/ML IJ SOLN
INTRAVENOUS | Status: DC
  Filled 2019-12-25: qty 1000

## 2019-12-25 MED ORDER — POLYETHYLENE GLYCOL 3350 17 G PO PACK: 17.0000 g | PACK | Freq: Every day | ORAL | Status: DC | PRN

## 2019-12-25 MED ORDER — IOHEXOL 350 MG/ML SOLN
100.0000 mL | Freq: Once | INTRAVENOUS | Status: AC | PRN
  Administered 2019-12-25: 100 mL via INTRAVENOUS

## 2019-12-25 MED ORDER — ONDANSETRON HCL 4 MG PO TABS: 4.0000 mg | ORAL_TABLET | Freq: Four times a day (QID) | ORAL | Status: DC | PRN

## 2019-12-25 MED ORDER — SODIUM CHLORIDE 0.9% FLUSH: 3.0000 mL | INTRAVENOUS | Status: DC | PRN

## 2019-12-25 MED ORDER — POTASSIUM CHLORIDE IN NACL 20-0.9 MEQ/L-% IV SOLN
Freq: Once | INTRAVENOUS | Status: AC
  Filled 2019-12-25: qty 1000

## 2019-12-25 MED ORDER — INFLUENZA VAC SPLIT QUAD 0.5 ML IM SUSY
0.5000 mL | PREFILLED_SYRINGE | INTRAMUSCULAR | Status: AC
  Administered 2019-12-27: 12:00:00 0.5 mL via INTRAMUSCULAR
  Filled 2019-12-25: qty 0.5

## 2019-12-25 MED ORDER — LORAZEPAM 2 MG/ML IJ SOLN
1.0000 mg | Freq: Once | INTRAMUSCULAR | Status: AC
  Administered 2019-12-25: 19:00:00 1 mg via INTRAVENOUS
  Filled 2019-12-25: qty 1

## 2019-12-25 NOTE — H&P (Signed)
Patient Demographics:    Martha Campbell, is a 44 y.o. female  MRN: [id number]   DOB - 08-Sep-1976  Admit Date - 12/25/2019  Outpatient Primary MD for the patient is Redmond School, MD   Assessment & Plan:    Principal Problem:   Delirium tremens with Seizure Active Problems:   Seizure ([place])   ETOH abuse   Tobacco abuse    1) delirium tremens/alcohol withdrawal with seizures--- CT head unremarkable, -Tachycardia, anxiety noted -IV lorazepam as ordered -CIWA protocol -IV banana bag and multivitamin as ordered  2) alcohol abuse--patient drinks scotch daily, apparently was drinking less lately, now having alcohol withdrawals with seizures  3) tobacco abuse--- nicotine patch is ordered  4)Severe hyponatremia/ hypokalemia/hypomagnesemia in an alcoholic female--- potassium is 2.1 magnesium 1.4, sodium is 125-- replace and recheck magnesium -No vomiting no diarrhea -Hyponatremia most likely due to beer potomania  5) thrombocytopenia--- platelet count 39k, reticulocyte combination of direct toxic effect of alcohol on the bone marrow and possibly some degree of liver cirrhosis -INR pending LFTs pending   With History of - Reviewed by me  Past Medical History:  Diagnosis Date  . Anxiety   . Skin cancer       Past Surgical History:  Procedure Laterality Date  . Cyst removed from Sinuses    . SKIN FULL THICKNESS GRAFT Right 06/16/2018   Procedure: surgical prep scalp wound 16cm2, full thickness skin graft from right arm to scalp;  Surgeon: Irene Limbo, MD;  Location: [place];  Service: Plastics;  Laterality: Right;    Chief Complaint  Patient presents with  . Chest Pain      HPI:    Martha Campbell  is a 44 y.o. female with past medical history relevant for seasonal allergies, tobacco  and alcohol abuse presents from PCPs office with tachycardia and subsequently had seizure episode while in the ED  --Patient drinks lots of scotch daily apparently was trying to cut back, she had a fall at home recently with rib cage injury, she went to PCP to get a work note so she can return to work because her rib cage injury was getting better -PCP noted tachycardia with heart rate in the high 130s so PCP referred patient to ED for further evaluation -While waiting on the stretcher in the ED in the hallway the [place] staff noticed patient making rather odd noises-- Patient was later found to be having seizures and then postictal -Responded well to lorazepam -CT head without acute findings -D-dimer 0.59, CTA chest without acute findings patient does have diffuse fatty change in the liver consistent with alcohol abuse  -Labs in ED reveal negative UDS, potassium 2.1 with a magnesium of 1.4 LFTs are pending -Troponin was not elevated -Sodium low at 125 with a chloride of 74, glucose 124 with anion gap over 20 -CBC with high MCH and 3 cytopenia platelet of 30 9K    Review of systems:  In addition to the HPI above,   A full Review of  Systems was done, all other systems reviewed are negative except as noted above in HPI , .    Social History:  Reviewed by me    Social History   Tobacco Use  . Smoking status: Current Every Day Smoker    Packs/day: 1.00    Types: Cigarettes  . Smokeless tobacco: Never Used  . Tobacco comment: Trying to quit smoking  Substance Use Topics  . Alcohol use: Yes    Comment: Drinks scotch daily       Family History :  Reviewed by me    Family History  Problem Relation Age of Onset  . Healthy Mother   . Healthy Father     Home Medications:   Prior to Admission medications   Medication Sig Start Date End Date Taking? Authorizing Provider  fexofenadine (ALLEGRA) 180 MG tablet Take 180 mg by mouth daily.    [provider]    WELLNESS PROTEIN SHAKE PO Take by mouth daily.    [provider]     Allergies:     Allergies  Allergen Reactions  . Penicillins      Physical Exam:   Vitals  Blood pressure 98/77, pulse (!) 131, temperature 98.1 F (36.7 C), temperature source Oral, resp. rate 17, height 5\' 5"  (1.651 m), weight 68 kg, last menstrual period 08/04/2013, SpO2 97 %.  Physical Examination: General appearance - alert, well appearing, and in no distress Mental status - alert, oriented to person, place, and time,  Eyes - sclera anicteric Neck - supple, no JVD elevation , Chest - clear  to auscultation bilaterally, symmetrical air movement,  Heart - S1 and S2 normal, regular  Abdomen - soft, nontender, nondistended, no masses or organomegaly Neurological - screening mental status exam normal, neck supple without rigidity, cranial nerves II through XII intact, DTR's normal and symmetric Extremities - no pedal edema noted, intact peripheral pulses  Skin - warm, dry     Data Review:    CBC Recent Labs  Lab 12/25/19 1227  WBC 4.7  HGB 18.6*  HCT 49.2*  PLT 39*  MCV 97.6  MCH 36.9*  MCHC 37.8*  RDW 13.6   ------------------------------------------------------------------------------------------------------------------  Chemistries  Recent Labs  Lab 12/25/19 1227  NA 125*  K 2.1*  CL 74*  CO2 28  GLUCOSE 124*  BUN 10  CREATININE 0.67  CALCIUM 10.2  MG 1.4*   ------------------------------------------------------------------------------------------------------------------ estimated creatinine clearance is 79.9 mL/min (by C-G formula based on SCr of 0.67 mg/dL). ------------------------------------------------------------------------------------------------------------------ No results for input(s): TSH, T4TOTAL, T3FREE, THYROIDAB in the last 72 hours.  Invalid input(s): FREET3   Coagulation profile No results for input(s): INR, PROTIME in the last 168  hours. ------------------------------------------------------------------------------------------------------------------- Recent Labs    12/25/19 1227  DDIMER 0.59*   -------------------------------------------------------------------------------------------------------------------  Cardiac Enzymes No results for input(s): CKMB, TROPONINI, MYOGLOBIN in the last 168 hours.  Invalid input(s): CK ------------------------------------------------------------------------------------------------------------------ No results found for: BNP   ---------------------------------------------------------------------------------------------------------------  Urinalysis    Component Value Date/Time   COLORURINE AMBER (A) 12/25/2019 1340   APPEARANCEUR HAZY (A) 12/25/2019 1340   LABSPEC 1.037 (H) 12/25/2019 1340   PHURINE 5.0 12/25/2019 1340   GLUCOSEU NEGATIVE 12/25/2019 1340   HGBUR SMALL (A) 12/25/2019 1340   BILIRUBINUR SMALL (A) 12/25/2019 1340   KETONESUR 5 (A) 12/25/2019 1340   PROTEINUR >=300 (A) 12/25/2019 1340   NITRITE NEGATIVE 12/25/2019 1340   LEUKOCYTESUR NEGATIVE 12/25/2019 1340    ----------------------------------------------------------------------------------------------------------------  Imaging Results:    DG Chest 2 View  Result Date: 12/25/2019 CLINICAL DATA:  Chest pain.  Recent fall EXAM: CHEST - 2 VIEW COMPARISON:  None. FINDINGS: Lungs are clear. Heart size and pulmonary vascularity are normal. No adenopathy. Small metallic foreign body in the anterior right chest wall. No bone lesions. No pneumothorax. IMPRESSION: No edema or consolidation. No pneumothorax. Cardiac silhouette within normal limits. Electronically Signed   By: Lowella Grip III M.D.   On: 12/25/2019 12:03   CT Head Wo Contrast  Result Date: 12/25/2019 CLINICAL DATA:  Seizure. Fall 2 weeks ago. EXAM: CT HEAD WITHOUT CONTRAST TECHNIQUE: Contiguous axial images were obtained from the base of  the skull through the vertex without intravenous contrast. COMPARISON:  None. FINDINGS: Brain: There is residual intravascular contrast material from an earlier chest CTA. No acute intracranial hemorrhage is identified within this mild limitation. No acute infarct, mass, midline shift, or extra-axial fluid collection is evident. The ventricles are normal in size. The cerebral and cerebellar sulci are slightly prominent for age likely reflecting mild parenchymal volume loss. Vascular: Calcified atherosclerosis at the skull base. Skull: No fracture or suspicious osseous lesion. Sinuses/Orbits: Visualized paranasal sinuses and mastoid air cells are clear. Visualized orbits are unremarkable. Other: Soft tissue defect in the midline frontal scalp. IMPRESSION: No evidence of acute intracranial abnormality. Electronically Signed   By: Logan Bores M.D.   On: 12/25/2019 16:41   CT Angio Chest PE W and/or Wo Contrast  Result Date: 12/25/2019 CLINICAL DATA:  PE suspected. Fall 2 weeks ago. Fell on right side. Persistent right-sided chest pain. EXAM: CT ANGIOGRAPHY CHEST WITH CONTRAST TECHNIQUE: Multidetector CT imaging of the chest was performed using the standard protocol during bolus administration of intravenous contrast. Multiplanar CT image reconstructions and MIPs were obtained to evaluate the vascular anatomy. CONTRAST:  190mL OMNIPAQUE IOHEXOL 350 MG/ML SOLN COMPARISON:  Two-view chest x-ray 12/25/2019 FINDINGS: Cardiovascular: The heart size is normal. The lung bases are clear. No significant pleural or pericardial effusion is present. Aorta and great vessel origins are within normal limits. Pulmonary artery opacification is excellent. No focal filling defects are present to suggest pulmonary embolus. Mediastinum/Nodes: No significant mediastinal, hilar, or axillary adenopathy is present. Metallic BB is located in the right breast. Lungs/Pleura: Lungs are clear without focal nodule, mass, or airspace disease. No  significant pleural effusion or pneumothorax is present. Upper Abdomen: Diffuse fatty infiltration of the liver is noted. No focal lesions are present. Limited imaging of the upper abdomen is otherwise unremarkable. Musculoskeletal: Superior endplate fractures at T3, T8, T9, and T12 are remote. Review of the MIP images confirms the above findings. IMPRESSION: 1. No evidence for pulmonary embolus. 2. No acute or focal lesion to explain the patient's symptoms. 3. Diffuse fatty infiltration of the liver. 4. Remote superior endplate fractures at T3, T8, T9, and T12. Electronically Signed   By: San Morelle M.D.   On: 12/25/2019 15:06    Radiological Exams on Admission: DG Chest 2 View  Result Date: 12/25/2019 CLINICAL DATA:  Chest pain.  Recent fall EXAM: CHEST - 2 VIEW COMPARISON:  None. FINDINGS: Lungs are clear. Heart size and pulmonary vascularity are normal. No adenopathy. Small metallic foreign body in the anterior right chest wall. No bone lesions. No pneumothorax. IMPRESSION: No edema or consolidation. No pneumothorax. Cardiac silhouette within normal limits. Electronically Signed   By: Lowella Grip III M.D.   On: 12/25/2019 12:03   CT Head Wo Contrast  Result Date: 12/25/2019  CLINICAL DATA:  Seizure. Fall 2 weeks ago. EXAM: CT HEAD WITHOUT CONTRAST TECHNIQUE: Contiguous axial images were obtained from the base of the skull through the vertex without intravenous contrast. COMPARISON:  None. FINDINGS: Brain: There is residual intravascular contrast material from an earlier chest CTA. No acute intracranial hemorrhage is identified within this mild limitation. No acute infarct, mass, midline shift, or extra-axial fluid collection is evident. The ventricles are normal in size. The cerebral and cerebellar sulci are slightly prominent for age likely reflecting mild parenchymal volume loss. Vascular: Calcified atherosclerosis at the skull base. Skull: No fracture or suspicious osseous lesion.  Sinuses/Orbits: Visualized paranasal sinuses and mastoid air cells are clear. Visualized orbits are unremarkable. Other: Soft tissue defect in the midline frontal scalp. IMPRESSION: No evidence of acute intracranial abnormality. Electronically Signed   By: Logan Bores M.D.   On: 12/25/2019 16:41   CT Angio Chest PE W and/or Wo Contrast  Result Date: 12/25/2019 CLINICAL DATA:  PE suspected. Fall 2 weeks ago. Fell on right side. Persistent right-sided chest pain. EXAM: CT ANGIOGRAPHY CHEST WITH CONTRAST TECHNIQUE: Multidetector CT imaging of the chest was performed using the standard protocol during bolus administration of intravenous contrast. Multiplanar CT image reconstructions and MIPs were obtained to evaluate the vascular anatomy. CONTRAST:  150mL OMNIPAQUE IOHEXOL 350 MG/ML SOLN COMPARISON:  Two-view chest x-ray 12/25/2019 FINDINGS: Cardiovascular: The heart size is normal. The lung bases are clear. No significant pleural or pericardial effusion is present. Aorta and great vessel origins are within normal limits. Pulmonary artery opacification is excellent. No focal filling defects are present to suggest pulmonary embolus. Mediastinum/Nodes: No significant mediastinal, hilar, or axillary adenopathy is present. Metallic BB is located in the right breast. Lungs/Pleura: Lungs are clear without focal nodule, mass, or airspace disease. No significant pleural effusion or pneumothorax is present. Upper Abdomen: Diffuse fatty infiltration of the liver is noted. No focal lesions are present. Limited imaging of the upper abdomen is otherwise unremarkable. Musculoskeletal: Superior endplate fractures at T3, T8, T9, and T12 are remote. Review of the MIP images confirms the above findings. IMPRESSION: 1. No evidence for pulmonary embolus. 2. No acute or focal lesion to explain the patient's symptoms. 3. Diffuse fatty infiltration of the liver. 4. Remote superior endplate fractures at T3, T8, T9, and T12. Electronically  Signed   By: San Morelle M.D.   On: 12/25/2019 15:06   DVT Prophylaxis -SCD  (low platelets AM Labs Ordered, also please review Full Orders  Family Communication: Admission, patients condition and plan of care including tests being ordered have been discussed with the patient  who indicate understanding and agree with the plan   Code Status - Full Code  Likely DC to  TBD  Condition   stable  Roxan Hockey M.D on 12/25/2019 at 6:36 PM Go to [email or web address] -  for contact info  [facility] - Office  [phone]

## 2019-12-25 NOTE — ED Notes (Signed)
Patient transported to CT 

## 2019-12-25 NOTE — ED Notes (Signed)
CRITICAL VALUE ALERT  Critical Value:  K 2.1  Date & Time Notied:  12/24/18 1318  Provider Notified: Wilson Singer  Orders Received/Actions taken: na

## 2019-12-25 NOTE — ED Triage Notes (Addendum)
Pt presents to UC stating she fell 2 weeks ago on her right side. Pt states right side is not in pain as it was 2 weeks ago. Pt also has scabbed right elbow from fall. Pt states her employment needs a doctor's note for her to go back to work. Pt states she hasn't been eating or drank well the past 4 days due to rib pain.

## 2019-12-25 NOTE — ED Provider Notes (Signed)
[facility] EMERGENCY DEPARTMENT Provider Note   CSN: [id number] Arrival date & time: 12/25/19  1106     History Chief Complaint  Patient presents with  . Chest Pain    Martha Campbell is a 44 y.o. female.  HPI    43yM sent to ER for further evaluation of abnormal EKG. Went to UC and not to be tachycardic with ischemic appearing EKG. Pt reports mechanical fall about a week ago. R side CP after that has since resolved. No cough. No dyspnea. No fever or chills. No n/v. Denies cardiac history.  Past Medical History:  Diagnosis Date  . Anxiety   . Skin cancer     There are no problems to display for this patient.   Past Surgical History:  Procedure Laterality Date  . Cyst removed from Sinuses    . SKIN FULL THICKNESS GRAFT Right 06/16/2018   Procedure: surgical prep scalp wound 16cm2, full thickness skin graft from right arm to scalp;  Surgeon: Irene Limbo, MD;  Location: [place];  Service: Plastics;  Laterality: Right;     OB History   No obstetric history on file.     Family History  Problem Relation Age of Onset  . Healthy Mother   . Healthy Father     Social History   Tobacco Use  . Smoking status: Current Every Day Smoker    Packs/day: 1.00    Types: Cigarettes  . Smokeless tobacco: Never Used  . Tobacco comment: Trying to quit smoking  Substance Use Topics  . Alcohol use: Yes    Comment: Drinks scotch daily  . Drug use: Never    Home Medications Prior to Admission medications   Medication Sig Start Date End Date Taking? Authorizing Provider  fexofenadine (ALLEGRA) 180 MG tablet Take 180 mg by mouth daily.    [provider]  WELLNESS PROTEIN SHAKE PO Take by mouth daily.    [provider]    Allergies    Penicillins  Review of Systems   Review of Systems All systems reviewed and negative, other than as noted in HPI.  Physical Exam Updated Vital Signs BP (!) 159/104   Pulse (!) 127   Temp 98.1 F  (36.7 C) (Oral)   Resp (!) 27   Ht 5\' 5"  (1.651 m)   Wt 68 kg   LMP 08/04/2013   SpO2 97%   BMI 24.96 kg/m   Physical Exam Vitals and nursing note reviewed.  Constitutional:      General: She is not in acute distress.    Appearance: She is well-developed.  HENT:     Head: Normocephalic and atraumatic.  Eyes:     General:        Right eye: No discharge.        Left eye: No discharge.     Conjunctiva/sclera: Conjunctivae normal.  Cardiovascular:     Rate and Rhythm: Regular rhythm. Tachycardia present.     Heart sounds: Normal heart sounds. No murmur. No friction rub. No gallop.   Pulmonary:     Effort: Pulmonary effort is normal. No respiratory distress.     Breath sounds: Normal breath sounds.  Abdominal:     General: There is no distension.     Palpations: Abdomen is soft.     Tenderness: There is no abdominal tenderness.  Musculoskeletal:        General: No tenderness.     Cervical back: Neck supple.  Skin:  General: Skin is warm and dry.  Neurological:     Mental Status: She is alert.  Psychiatric:        Behavior: Behavior normal.        Thought Content: Thought content normal.     ED Results / Procedures / Treatments   Labs (all labs ordered are listed, but only abnormal results are displayed) Labs Reviewed  BASIC METABOLIC PANEL - Abnormal; Notable for the following components:      Result Value   Sodium 125 (*)    Potassium 2.1 (*)    Chloride 74 (*)    Glucose, Bld 124 (*)    Anion gap >20 (*)    All other components within normal limits  CBC - Abnormal; Notable for the following components:   Hemoglobin 18.6 (*)    HCT 49.2 (*)    MCH 36.9 (*)    MCHC 37.8 (*)    Platelets 39 (*)    All other components within normal limits  D-DIMER, QUANTITATIVE (NOT AT [facility]) - Abnormal; Notable for the following components:   D-Dimer, Quant 0.59 (*)    All other components within normal limits  URINALYSIS, ROUTINE W REFLEX MICROSCOPIC - Abnormal; Notable  for the following components:   Color, Urine AMBER (*)    APPearance HAZY (*)    Specific Gravity, Urine 1.037 (*)    Hgb urine dipstick SMALL (*)    Bilirubin Urine SMALL (*)    Ketones, ur 5 (*)    Protein, ur >=300 (*)    All other components within normal limits  HEPATIC FUNCTION PANEL - Abnormal; Notable for the following components:   AST 294 (*)    ALT 78 (*)    Total Bilirubin 3.4 (*)    Bilirubin, Direct 1.2 (*)    Indirect Bilirubin 2.2 (*)    All other components within normal limits  MAGNESIUM - Abnormal; Notable for the following components:   Magnesium 1.4 (*)    All other components within normal limits  RAPID URINE DRUG SCREEN, HOSP PERFORMED - Abnormal; Notable for the following components:   Tetrahydrocannabinol RESULTS UNAVAILABLE DUE TO INTERFERING SUBSTANCE (*)    All other components within normal limits  BASIC METABOLIC PANEL - Abnormal; Notable for the following components:   Sodium 132 (*)    Chloride 91 (*)    Glucose, Bld 69 (*)    Calcium 8.8 (*)    All other components within normal limits  CBC - Abnormal; Notable for the following components:   MCV 102.9 (*)    MCH 36.5 (*)    Platelets 36 (*)    All other components within normal limits  COMPREHENSIVE METABOLIC PANEL - Abnormal; Notable for the following components:   Potassium 3.1 (*)    Chloride 97 (*)    Glucose, Bld 69 (*)    Calcium 8.1 (*)    Total Protein 5.6 (*)    Albumin 3.0 (*)    AST 224 (*)    ALT 59 (*)    Total Bilirubin 2.6 (*)    All other components within normal limits  GLUCOSE, CAPILLARY - Abnormal; Notable for the following components:   Glucose-Capillary 60 (*)    All other components within normal limits  GLUCOSE, CAPILLARY - Abnormal; Notable for the following components:   Glucose-Capillary 67 (*)    All other components within normal limits  CBG MONITORING, ED - Abnormal; Notable for the following components:   Glucose-Capillary 128 (*)  All other  components within normal limits  SARS CORONAVIRUS 2 (TAT 6-24 HRS)  PROTIME-INR  LACTIC ACID, PLASMA  LACTIC ACID, PLASMA  PHOSPHORUS  MAGNESIUM  HIV ANTIBODY (ROUTINE TESTING W REFLEX)  GLUCOSE, CAPILLARY  TROPONIN I (HIGH SENSITIVITY)  TROPONIN I (HIGH SENSITIVITY)    EKG None  Radiology DG Chest 2 View  Result Date: 12/25/2019 CLINICAL DATA:  Chest pain.  Recent fall EXAM: CHEST - 2 VIEW COMPARISON:  None. FINDINGS: Lungs are clear. Heart size and pulmonary vascularity are normal. No adenopathy. Small metallic foreign body in the anterior right chest wall. No bone lesions. No pneumothorax. IMPRESSION: No edema or consolidation. No pneumothorax. Cardiac silhouette within normal limits. Electronically Signed   By: Lowella Grip III M.D.   On: 12/25/2019 12:03   CT Head Wo Contrast  Result Date: 12/25/2019 CLINICAL DATA:  Seizure. Fall 2 weeks ago. EXAM: CT HEAD WITHOUT CONTRAST TECHNIQUE: Contiguous axial images were obtained from the base of the skull through the vertex without intravenous contrast. COMPARISON:  None. FINDINGS: Brain: There is residual intravascular contrast material from an earlier chest CTA. No acute intracranial hemorrhage is identified within this mild limitation. No acute infarct, mass, midline shift, or extra-axial fluid collection is evident. The ventricles are normal in size. The cerebral and cerebellar sulci are slightly prominent for age likely reflecting mild parenchymal volume loss. Vascular: Calcified atherosclerosis at the skull base. Skull: No fracture or suspicious osseous lesion. Sinuses/Orbits: Visualized paranasal sinuses and mastoid air cells are clear. Visualized orbits are unremarkable. Other: Soft tissue defect in the midline frontal scalp. IMPRESSION: No evidence of acute intracranial abnormality. Electronically Signed   By: Logan Bores M.D.   On: 12/25/2019 16:41   CT Angio Chest PE W and/or Wo Contrast  Result Date: 12/25/2019 CLINICAL DATA:   PE suspected. Fall 2 weeks ago. Fell on right side. Persistent right-sided chest pain. EXAM: CT ANGIOGRAPHY CHEST WITH CONTRAST TECHNIQUE: Multidetector CT imaging of the chest was performed using the standard protocol during bolus administration of intravenous contrast. Multiplanar CT image reconstructions and MIPs were obtained to evaluate the vascular anatomy. CONTRAST:  131mL OMNIPAQUE IOHEXOL 350 MG/ML SOLN COMPARISON:  Two-view chest x-ray 12/25/2019 FINDINGS: Cardiovascular: The heart size is normal. The lung bases are clear. No significant pleural or pericardial effusion is present. Aorta and great vessel origins are within normal limits. Pulmonary artery opacification is excellent. No focal filling defects are present to suggest pulmonary embolus. Mediastinum/Nodes: No significant mediastinal, hilar, or axillary adenopathy is present. Metallic BB is located in the right breast. Lungs/Pleura: Lungs are clear without focal nodule, mass, or airspace disease. No significant pleural effusion or pneumothorax is present. Upper Abdomen: Diffuse fatty infiltration of the liver is noted. No focal lesions are present. Limited imaging of the upper abdomen is otherwise unremarkable. Musculoskeletal: Superior endplate fractures at T3, T8, T9, and T12 are remote. Review of the MIP images confirms the above findings. IMPRESSION: 1. No evidence for pulmonary embolus. 2. No acute or focal lesion to explain the patient's symptoms. 3. Diffuse fatty infiltration of the liver. 4. Remote superior endplate fractures at T3, T8, T9, and T12. Electronically Signed   By: San Morelle M.D.   On: 12/25/2019 15:06    Procedures Procedures (including critical care time)  CRITICAL CARE Performed by: Virgel Manifold Total critical care time: 35 minutes Critical care time was exclusive of separately billable procedures and treating other patients. Critical care was necessary to treat or prevent imminent or life-threatening  deterioration. Critical care was time spent personally by me on the following activities: development of treatment plan with patient and/or surrogate as well as nursing, discussions with consultants, evaluation of patient's response to treatment, examination of patient, obtaining history from patient or surrogate, ordering and performing treatments and interventions, ordering and review of laboratory studies, ordering and review of radiographic studies, pulse oximetry and re-evaluation of patient's condition.   Medications Ordered in ED Medications  iohexol (OMNIPAQUE) 350 MG/ML injection 100 mL (has no administration in time range)  sodium chloride flush (NS) 0.9 % injection 3 mL (3 mLs Intravenous Given 12/25/19 1226)  potassium chloride SA (KLOR-CON) CR tablet 60 mEq (60 mEq Oral Given 12/25/19 1343)  0.9 % NaCl with KCl 20 mEq/ L  infusion ( Intravenous Rate/Dose Verify 12/25/19 1344)  LORazepam (ATIVAN) injection 1 mg (1 mg Intravenous Given 12/25/19 1337)  thiamine (B-1) injection 100 mg (100 mg Intravenous Given [id number])  folic acid injection 1 mg (1 mg Intravenous Given 12/25/19 1338)    ED Course  I have reviewed the triage vital signs and the nursing notes.  Pertinent labs & imaging results that were available during my care of the patient were reviewed by me and considered in my medical decision making (see chart for details).    MDM Rules/Calculators/A&P                       1:54 PM Pt likely had seizure. [facility] staff actually heard an odd noise coming from her stretcher in the hall as he was walking past. He checked on her and noted seizure like activity. Pt bit her lip. By the time I arrived she was talking although confused. Appeared to be post-ictal. I suspect this is alcohol withdrawal. Apparently she drinks scotch daily. Withdrawal would explain the tachycardia. The thrombocytopenia could be from heavy chronic etoh use. We were able to find a room for her. Placed on monitor.  Ativan given. Additional labs ordered.   Final Clinical Impression(s) / ED Diagnoses Final diagnoses:  Alcohol withdrawal syndrome with complication ([place])  Seizures ([place])    Rx / DC Orders ED Discharge Orders    None       Virgel Manifold, MD 12/26/19 1056

## 2019-12-25 NOTE — Discharge Instructions (Signed)
Recommending further evaluation and management in the ED.  EKG concerning for possible inferior ischemia, or decreased blood flow to heart.  This may be a precursor for a heart attacked.  Patient declines EMS transport.  Will go by private vehicle to [facility] ED.  Aspirin 324 mg given to patient at [facility].

## 2019-12-25 NOTE — ED Notes (Signed)
Pt states recent fall and fx ribs on right, states she seen her PCP today and was sent here due to elevated BP

## 2019-12-25 NOTE — ED Provider Notes (Signed)
[name]   [id number] 12/25/19 Arrival Time: 0909  CC: Rib PAIN; tachycardia  SUBJECTIVE: History from: patient. Martha Campbell is a 44 y.o. female complains of resolved RT rib pain x 1 week.  Had a fall while running up steps and landed on RT side 1 week ago.  Presents today for return to work note. Denies rib pain today.  Denies aggravating or alleviating factors.  Denies similar symptoms in the past.  Denies fever, chills, erythema, ecchymosis, effusion, weakness, numbness and tingling, dysphagia, dyspnea.    Incidentally patient with tachycardia on exam. Denies previous hx of cardiac disease or elevated heart rate in the past.  Denies taking medication or recreational drug use.  States she has not ate or drank much recently.  Heart rate 1 year ago was 73 bpm.  Denies rhinorrhea, congestion, sore throat, cough, chest pain, palpitation, SOB, nausea, vomiting, diarrhea.  ROS: As per HPI.  All other pertinent ROS negative.     Past Medical History:  Diagnosis Date  . Anxiety   . Skin cancer    Past Surgical History:  Procedure Laterality Date  . Cyst removed from Sinuses    . SKIN FULL THICKNESS GRAFT Right 06/16/2018   Procedure: surgical prep scalp wound 16cm2, full thickness skin graft from right arm to scalp;  Surgeon: Irene Limbo, MD;  Location: [place];  Service: Plastics;  Laterality: Right;   Allergies  Allergen Reactions  . Penicillins    No current facility-administered medications on file prior to encounter.   Current Outpatient Medications on File Prior to Encounter  Medication Sig Dispense Refill  . fexofenadine (ALLEGRA) 180 MG tablet Take 180 mg by mouth daily.    Dorian Heckle PROTEIN SHAKE PO Take by mouth daily.     Social History   Socioeconomic History  . Marital status: Divorced    Spouse name: Not on file  . Number of children: Not on file  . Years of education: Not on file  . Highest education level: Not on file    Occupational History  . Not on file  Tobacco Use  . Smoking status: Current Every Day Smoker    Packs/day: 1.00    Types: Cigarettes  . Smokeless tobacco: Never Used  . Tobacco comment: Trying to quit smoking  Substance and Sexual Activity  . Alcohol use: Yes    Comment: Drinks scotch daily  . Drug use: Never  . Sexual activity: Yes    Birth control/protection: None  Other Topics Concern  . Not on file  Social History Narrative  . Not on file   Social Determinants of Health   Financial Resource Strain:   . Difficulty of Paying Living Expenses: Not on file  Food Insecurity:   . Worried About Charity fundraiser in the Last Year: Not on file  . Ran Out of Food in the Last Year: Not on file  Transportation Needs:   . Lack of Transportation (Medical): Not on file  . Lack of Transportation (Non-Medical): Not on file  Physical Activity:   . Days of Exercise per Week: Not on file  . Minutes of Exercise per Session: Not on file  Stress:   . Feeling of Stress : Not on file  Social Connections:   . Frequency of Communication with Friends and Family: Not on file  . Frequency of Social Gatherings with Friends and Family: Not on file  . Attends Religious Services: Not on file  . Active Member  of Clubs or Organizations: Not on file  . Attends Archivist Meetings: Not on file  . Marital Status: Not on file  Intimate Partner Violence:   . Fear of Current or Ex-Partner: Not on file  . Emotionally Abused: Not on file  . Physically Abused: Not on file  . Sexually Abused: Not on file   Family History  Problem Relation Age of Onset  . Healthy Mother   . Healthy Father     OBJECTIVE:  Vitals:   12/25/19 0916  BP: (!) 128/93  Pulse: (!) 138  Resp: 20  Temp: 98.3 F (36.8 C)  TempSrc: Oral  SpO2: 93%    General appearance: ALERT; in no acute distress.  Head: NCAT ENT: PERRL, EOMI grossly; LT EAC erythematous, and TM injected; RT EAC clear, TM pearly gray; nares  patent without rhinorrhea; oropharynx clear Lungs: Normal respiratory effort; CTAB throughout bilateral lung fields of the anterior and posterior chest CV: Trachycardic Musculoskeletal: Chest wall Inspection: Skin warm, dry, clear and intact without obvious erythema, effusion, or ecchymosis.  Palpation: Nontender to palpation over rib cage ROM: FROM active and passive Skin: warm and dry Neurologic: Ambulates without difficulty Psychological: alert and cooperative; normal mood and affect  EKG:  EKG with sinus tachycardia with possible t-wave inversion in lead III and subtle ST depression in V4.  No narrowing or widening of the QRS complexes.  ASSESSMENT & PLAN:  1. Pain in rib   2. Tachycardia   3. Nonspecific abnormal electrocardiogram (ECG) (EKG)     Meds ordered this encounter  Medications  . aspirin chewable tablet 324 mg    Recommending further evaluation and management in the ED.  EKG concerning for possible inferior ischemia, or decreased blood flow to heart.  This may be a precursor for a heart attacke.  Patient declines EMS transport.  Will go by private vehicle to [facility] ED.  Aspirin 324 mg given to patient at [facility].     Lestine Box, PA-C 12/25/19 1107

## 2019-12-25 NOTE — ED Triage Notes (Signed)
Pain to RT rib area x 1 week since pt tripped and fell a week ago.  Sent by pcp to rule out any cardiac issues.

## 2019-12-26 LAB — MAGNESIUM: Magnesium: 2.3 mg/dL (ref 1.7–2.4)

## 2019-12-26 LAB — COMPREHENSIVE METABOLIC PANEL
ALT: 59 U/L — ABNORMAL HIGH (ref 0–44)
AST: 224 U/L — ABNORMAL HIGH (ref 15–41)
Albumin: 3 g/dL — ABNORMAL LOW (ref 3.5–5.0)
Alkaline Phosphatase: 96 U/L (ref 38–126)
Anion gap: 11 (ref 5–15)
BUN: 7 mg/dL (ref 6–20)
CO2: 28 mmol/L (ref 22–32)
Calcium: 8.1 mg/dL — ABNORMAL LOW (ref 8.9–10.3)
Chloride: 97 mmol/L — ABNORMAL LOW (ref 98–111)
Creatinine, Ser: 0.52 mg/dL (ref 0.44–1.00)
GFR calc Af Amer: >60 mL/min (ref >60)
GFR calc non Af Amer: >60 mL/min (ref >60)
Glucose, Bld: 69 mg/dL — ABNORMAL LOW (ref 70–99)
Potassium: 3.1 mmol/L — ABNORMAL LOW (ref 3.5–5.1)
Sodium: 136 mmol/L (ref 135–145)
Total Bilirubin: 2.6 mg/dL — ABNORMAL HIGH (ref 0.3–1.2)
Total Protein: 5.6 g/dL — ABNORMAL LOW (ref 6.5–8.1)

## 2019-12-26 LAB — GLUCOSE, CAPILLARY
Glucose-Capillary: 60 mg/dL — ABNORMAL LOW (ref 70–99)
Glucose-Capillary: 67 mg/dL — ABNORMAL LOW (ref 70–99)
Glucose-Capillary: 81 mg/dL (ref 70–99)

## 2019-12-26 LAB — CBC
HCT: 42.3 % (ref 36.0–46.0)
Hemoglobin: 15 g/dL (ref 12.0–15.0)
MCH: 36.5 pg — ABNORMAL HIGH (ref 26.0–34.0)
MCHC: 35.5 g/dL (ref 30.0–36.0)
MCV: 102.9 fL — ABNORMAL HIGH (ref 80.0–100.0)
Platelets: 36 10*3/uL — ABNORMAL LOW (ref 150–400)
RBC: 4.11 MIL/uL (ref 3.87–5.11)
RDW: 14.1 % (ref 11.5–15.5)
WBC: 4.6 10*3/uL (ref 4.0–10.5)
nRBC: 0 % (ref 0.0–0.2)

## 2019-12-26 LAB — HIV ANTIBODY (ROUTINE TESTING W REFLEX): HIV Screen 4th Generation wRfx: NONREACTIVE

## 2019-12-26 MED ORDER — PANTOPRAZOLE SODIUM 40 MG PO TBEC
40.0000 mg | DELAYED_RELEASE_TABLET | Freq: Every day | ORAL | Status: DC
  Administered 2019-12-26 – 2019-12-30 (×4): 40 mg via ORAL
  Filled 2019-12-26 (×5): qty 1

## 2019-12-26 MED ORDER — CALCIUM CARBONATE ANTACID 500 MG PO CHEW
800.0000 mg | CHEWABLE_TABLET | Freq: Once | ORAL | Status: AC
  Administered 2019-12-26: 10:00:00 800 mg via ORAL
  Filled 2019-12-26: qty 4

## 2019-12-26 MED ORDER — ALUM & MAG HYDROXIDE-SIMETH 200-200-20 MG/5ML PO SUSP: 30.0000 mL | ORAL | Status: DC | PRN

## 2019-12-26 MED ORDER — DEXMEDETOMIDINE HCL IN NACL 400 MCG/100ML IV SOLN: 0.4000 ug/kg/h | INTRAVENOUS | Status: DC

## 2019-12-26 MED ORDER — POTASSIUM CHLORIDE CRYS ER 20 MEQ PO TBCR
40.0000 meq | EXTENDED_RELEASE_TABLET | ORAL | Status: AC
  Administered 2019-12-26: 40 meq via ORAL
  Filled 2019-12-26: qty 2

## 2019-12-26 MED ORDER — LORAZEPAM 2 MG/ML IJ SOLN
1.0000 mg | Freq: Once | INTRAMUSCULAR | Status: AC
  Administered 2019-12-26: 10:00:00 1 mg via INTRAVENOUS
  Filled 2019-12-26: qty 1

## 2019-12-26 MED ORDER — DEXMEDETOMIDINE HCL IN NACL 200 MCG/50ML IV SOLN: 0.4000 ug/kg/h | INTRAVENOUS | Status: DC

## 2019-12-26 MED ORDER — KCL IN DEXTROSE-NACL 40-5-0.9 MEQ/L-%-% IV SOLN: INTRAVENOUS | Status: DC

## 2019-12-26 NOTE — Progress Notes (Signed)
Hypoglycemic Event  CBG: 60  Treatment: 4 oz juice/soda  Symptoms: None  Follow-up CBG: [id number] CBG Result:67  Possible Reasons for Event: Inadequate meal intake  Comments/MD notified:patient alert oriented continue oral intake    Normajean Glasgow

## 2019-12-26 NOTE — Progress Notes (Signed)
Hypoglycemic Event  CBG: 67   Treatment: 4 oz juice/soda  Symptoms: None  Follow-up CBG: Time:0700 CBG Result:81   Possible Reasons for Event: Inadequate meal intake  Comments/MD notified:continue to monitor encourage po intake    Normajean Glasgow

## 2019-12-26 NOTE — Progress Notes (Signed)
-  Persistent delirium tremens symptoms despite IV lorazepam -- -Transferred to stepdown to allow for more frequent iv Lorazepam or iv Precedex Continue to monitor closely especially given alcohol withdrawal seizure on 12/25/2019  Roxan Hockey, MD

## 2019-12-26 NOTE — Progress Notes (Signed)
Patient Demographics:    Martha Campbell, is a 44 y.o. female, DOB - 06-02-1976, [id number]  Admit date - 12/25/2019   Admitting Physician Brody Kump Denton Brick, MD  Outpatient Primary MD for the patient is Redmond School, MD  LOS - 1   Chief Complaint  Patient presents with  . Chest Pain        Subjective:    Martha Campbell today has no fevers, no emesis,  No chest pain,  -- Restless, very anxious, threatening to leave AMA, -Heart rate up to the 150s with ambulation to the bathroom -Tremors and alcohol withdrawal symptoms noted  Assessment  & Plan :    Principal Problem:   Delirium tremens with Seizure Active Problems:   Seizure ([place])   ETOH abuse   Tobacco abuse  Brief Summary:- 44 y.o. female with past medical history relevant for seasonal allergies, tobacco and alcohol abuse presents from PCPs office with tachycardia and subsequently had seizure episode in the ED consistent with alcohol withdrawal and was admitted 12/25/2019 for same   A/p 1) delirium tremens/alcohol withdrawal with seizures--- CT head unremarkable, -Tachycardia, anxiety patient's, patient's heart rate went up to the 150s when she ambulated to the bathroom -Patient becoming more restless, threatening to leave AMA -IV lorazepam as ordered -CIWA protocol -Patient completed IV banana bag, continue  multivitamin as ordered  2) alcohol abuse--patient drinks scotch daily, apparently was drinking less lately, now having alcohol withdrawals with seizures--management as above #1  3) tobacco abuse--- nicotine patch is ordered  4)Severe hyponatremia/ hypokalemia/hypomagnesemia in an alcoholic female--- potassium is 2.1 magnesium 1.4, sodium is 125 on admission-- -on 12/26/2019 sodium is up to 136 potassium is up to 3.1, potassium is normalized at 2.3 -Continue replacement and rechecking electrolytes -No vomiting no diarrhea  5)  thrombocytopenia--- platelet count 36k,  most likely from a combination of direct toxic effect of alcohol on the bone marrow and possibly some degree of liver cirrhosis   6) recurrent hypoglycemia--- due to poor oral intake in a patient with alcohol withdrawal and seizures and sedation from post ictal state and lorazepam -D5 solution as ordered, point-of-care fingersticks  7)Alcoholic Liver Disease--- AST to  ALT ratio is almost 4:1,  - AST is 224, ALT is 59, T bili is 2.6, albumin is 3.0 -Platelets low at 36K  Disposition/Need for in-Hospital Stay- patient unable to be discharged at this time due to --- severe/significant alcohol withdrawal/delirium tremens with seizures and hypoglycemia requiring IV lorazepam and IV fluids  Code Status : Full  Family Communication:  None present  Disposition Plan  : TBD  Consults  :  na  DVT Prophylaxis  :  - SCDs (low platelet)  Lab Results  Component Value Date   PLT 36 (L) 12/26/2019    Inpatient Medications  Scheduled Meds: . folic acid  1 mg Oral Daily  . influenza vac split quadrivalent PF  0.5 mL Intramuscular Tomorrow-1000  . loratadine  10 mg Oral Daily  . LORazepam  0-4 mg Intravenous Q6H   Followed by  . [START ON 12/27/2019] LORazepam  0-4 mg Intravenous Q12H  . multivitamin with minerals  1 tablet Oral Daily  . nicotine  14 mg Transdermal Daily  . pantoprazole  40 mg Oral Daily  .  potassium chloride  40 mEq Oral Q3H  . sodium chloride flush  3 mL Intravenous Q12H  . thiamine  100 mg Oral Daily   Or  . thiamine  100 mg Intravenous Daily   Continuous Infusions: . sodium chloride    . sodium chloride 125 mL/hr at 12/26/19 0623   PRN Meds:.sodium chloride, acetaminophen **OR** acetaminophen, albuterol, LORazepam **OR** LORazepam, ondansetron **OR** ondansetron (ZOFRAN) IV, polyethylene glycol, sodium chloride flush, traZODone    Anti-infectives (From admission, onward)   None        Objective:   Vitals:    12/25/19 2030 12/25/19 2100 12/25/19 2134 12/26/19 0630  BP: 99/84 91/71 106/88 92/72  Pulse:   (!) 110 94  Resp: 17 15  18   Temp:   98.7 F (37.1 C) 98.2 F (36.8 C)  TempSrc:   Oral Oral  SpO2:   96% 94%  Weight:      Height:        Wt Readings from Last 3 Encounters:  12/25/19 68 kg  06/16/18 68.9 kg     Intake/Output Summary (Last 24 hours) at 12/26/2019 1043 Last data filed at 12/26/2019 0657 Gross per 24 hour  Intake 3052.19 ml  Output 513 ml  Net 2539.19 ml     Physical Exam  Gen:- Awake Alert, very anxious HEENT:- [place].AT, No sclera icterus, frontal scalp scar Neck-Supple Neck,No JVD,.  Lungs-  CTAB , fair symmetrical air movement CV- S1, S2 normal, regular , HR up to 150 with minimum activity Abd-  +ve B.Sounds, Abd Soft, No tenderness,    Extremity/Skin:- No  edema, pedal pulses present  Psych-affect is very anxious, restless Neuro-no new focal deficits, +ve  tremors   Data Review:   Micro Results Recent Results (from the past 240 hour(s))  SARS CORONAVIRUS 2 (TAT 6-24 HRS) Nasopharyngeal Nasopharyngeal Swab     Status: None   Collection Time: 12/25/19  1:38 PM   Specimen: Nasopharyngeal Swab  Result Value Ref Range Status   SARS Coronavirus 2 NEGATIVE NEGATIVE Final    Comment: (NOTE) SARS-CoV-2 target nucleic acids are NOT DETECTED. The SARS-CoV-2 RNA is generally detectable in upper and lower respiratory specimens during the acute phase of infection. Negative results do not preclude SARS-CoV-2 infection, do not rule out co-infections with other pathogens, and should not be used as the sole basis for treatment or other patient management decisions. Negative results must be combined with clinical observations, patient history, and epidemiological information. The expected result is Negative. Fact Sheet for Patients: SugarRoll.be Fact Sheet for Healthcare Providers: https://www.woods-mathews.com/ This test is  not yet approved or cleared by the [place] FDA and  has been authorized for detection and/or diagnosis of SARS-CoV-2 by FDA under an Emergency Use Authorization (EUA). This EUA will remain  in effect (meaning this test can be used) for the duration of the COVID-19 declaration under Section 56 4(b)(1) of the Act, 21 U.S.C. section 360bbb-3(b)(1), unless the authorization is terminated or revoked sooner. Performed at [facility], [place] [address], [place], [place] [address]     Radiology Reports DG Chest 2 View  Result Date: 12/25/2019 CLINICAL DATA:  Chest pain.  Recent fall EXAM: CHEST - 2 VIEW COMPARISON:  None. FINDINGS: Lungs are clear. Heart size and pulmonary vascularity are normal. No adenopathy. Small metallic foreign body in the anterior right chest wall. No bone lesions. No pneumothorax. IMPRESSION: No edema or consolidation. No pneumothorax. Cardiac silhouette within normal limits. Electronically Signed   By: Gwyndolyn Saxon  Jasmine December III M.D.   On: 12/25/2019 12:03   CT Head Wo Contrast  Result Date: 12/25/2019 CLINICAL DATA:  Seizure. Fall 2 weeks ago. EXAM: CT HEAD WITHOUT CONTRAST TECHNIQUE: Contiguous axial images were obtained from the base of the skull through the vertex without intravenous contrast. COMPARISON:  None. FINDINGS: Brain: There is residual intravascular contrast material from an earlier chest CTA. No acute intracranial hemorrhage is identified within this mild limitation. No acute infarct, mass, midline shift, or extra-axial fluid collection is evident. The ventricles are normal in size. The cerebral and cerebellar sulci are slightly prominent for age likely reflecting mild parenchymal volume loss. Vascular: Calcified atherosclerosis at the skull base. Skull: No fracture or suspicious osseous lesion. Sinuses/Orbits: Visualized paranasal sinuses and mastoid air cells are clear. Visualized orbits are unremarkable. Other: Soft tissue defect in the midline frontal  scalp. IMPRESSION: No evidence of acute intracranial abnormality. Electronically Signed   By: Logan Bores M.D.   On: 12/25/2019 16:41   CT Angio Chest PE W and/or Wo Contrast  Result Date: 12/25/2019 CLINICAL DATA:  PE suspected. Fall 2 weeks ago. Fell on right side. Persistent right-sided chest pain. EXAM: CT ANGIOGRAPHY CHEST WITH CONTRAST TECHNIQUE: Multidetector CT imaging of the chest was performed using the standard protocol during bolus administration of intravenous contrast. Multiplanar CT image reconstructions and MIPs were obtained to evaluate the vascular anatomy. CONTRAST:  12mL OMNIPAQUE IOHEXOL 350 MG/ML SOLN COMPARISON:  Two-view chest x-ray 12/25/2019 FINDINGS: Cardiovascular: The heart size is normal. The lung bases are clear. No significant pleural or pericardial effusion is present. Aorta and great vessel origins are within normal limits. Pulmonary artery opacification is excellent. No focal filling defects are present to suggest pulmonary embolus. Mediastinum/Nodes: No significant mediastinal, hilar, or axillary adenopathy is present. Metallic BB is located in the right breast. Lungs/Pleura: Lungs are clear without focal nodule, mass, or airspace disease. No significant pleural effusion or pneumothorax is present. Upper Abdomen: Diffuse fatty infiltration of the liver is noted. No focal lesions are present. Limited imaging of the upper abdomen is otherwise unremarkable. Musculoskeletal: Superior endplate fractures at T3, T8, T9, and T12 are remote. Review of the MIP images confirms the above findings. IMPRESSION: 1. No evidence for pulmonary embolus. 2. No acute or focal lesion to explain the patient's symptoms. 3. Diffuse fatty infiltration of the liver. 4. Remote superior endplate fractures at T3, T8, T9, and T12. Electronically Signed   By: San Morelle M.D.   On: 12/25/2019 15:06     CBC Recent Labs  Lab 12/25/19 1227 12/26/19 0554  WBC 4.7 4.6  HGB 18.6* 15.0  HCT  49.2* 42.3  PLT 39* 36*  MCV 97.6 102.9*  MCH 36.9* 36.5*  MCHC 37.8* 35.5  RDW 13.6 14.1    Chemistries  Recent Labs  Lab 12/25/19 1227 12/25/19 1724 12/25/19 1930 12/26/19 0554  NA 125*  --  132* 136  K 2.1*  --  3.8 3.1*  CL 74*  --  91* 97*  CO2 28  --  26 28  GLUCOSE 124*  --  69* 69*  BUN 10  --  8 7  CREATININE 0.67  --  0.47 0.52  CALCIUM 10.2  --  8.8* 8.1*  MG 1.4*  --   --  2.3  AST  --  294*  --  224*  ALT  --  78*  --  59*  ALKPHOS  --  114  --  96  BILITOT  --  3.4*  --  2.6*   ------------------------------------------------------------------------------------------------------------------ No results for input(s): CHOL, HDL, LDLCALC, TRIG, CHOLHDL, LDLDIRECT in the last 72 hours.  No results found for: HGBA1C ------------------------------------------------------------------------------------------------------------------ No results for input(s): TSH, T4TOTAL, T3FREE, THYROIDAB in the last 72 hours.  Invalid input(s): FREET3 ------------------------------------------------------------------------------------------------------------------ No results for input(s): VITAMINB12, FOLATE, FERRITIN, TIBC, IRON, RETICCTPCT in the last 72 hours.  Coagulation profile Recent Labs  Lab 12/25/19 1724  INR 0.9    Recent Labs    12/25/19 1227  DDIMER 0.59*    Cardiac Enzymes No results for input(s): CKMB, TROPONINI, MYOGLOBIN in the last 168 hours.  Invalid input(s): CK ------------------------------------------------------------------------------------------------------------------ No results found for: BNP   Roxan Hockey M.D on 12/26/2019 at 10:43 AM  Go to [email or web address] - for contact info  [facility] - Office  [phone]

## 2019-12-27 LAB — COMPREHENSIVE METABOLIC PANEL
ALT: 68 U/L — ABNORMAL HIGH (ref 0–44)
ALT: 71 U/L — ABNORMAL HIGH (ref 0–44)
AST: 229 U/L — ABNORMAL HIGH (ref 15–41)
AST: 215 U/L — ABNORMAL HIGH (ref 15–41)
Albumin: 3.2 g/dL — ABNORMAL LOW (ref 3.5–5.0)
Albumin: 3.4 g/dL — ABNORMAL LOW (ref 3.5–5.0)
Alkaline Phosphatase: 107 U/L (ref 38–126)
Alkaline Phosphatase: 112 U/L (ref 38–126)
Anion gap: 9 (ref 5–15)
Anion gap: 9 (ref 5–15)
BUN: <5 mg/dL — ABNORMAL LOW (ref 6–20)
BUN: <5 mg/dL — ABNORMAL LOW (ref 6–20)
CO2: 25 mmol/L (ref 22–32)
CO2: 23 mmol/L (ref 22–32)
Calcium: 8.6 mg/dL — ABNORMAL LOW (ref 8.9–10.3)
Calcium: 8.5 mg/dL — ABNORMAL LOW (ref 8.9–10.3)
Chloride: 104 mmol/L (ref 98–111)
Chloride: 107 mmol/L (ref 98–111)
Creatinine, Ser: 0.54 mg/dL (ref 0.44–1.00)
Creatinine, Ser: 0.54 mg/dL (ref 0.44–1.00)
GFR calc Af Amer: >60 mL/min (ref >60)
GFR calc Af Amer: >60 mL/min (ref >60)
GFR calc non Af Amer: >60 mL/min (ref >60)
GFR calc non Af Amer: >60 mL/min (ref >60)
Glucose, Bld: 70 mg/dL (ref 70–99)
Glucose, Bld: 67 mg/dL — ABNORMAL LOW (ref 70–99)
Potassium: 3.8 mmol/L (ref 3.5–5.1)
Potassium: 4.1 mmol/L (ref 3.5–5.1)
Sodium: 138 mmol/L (ref 135–145)
Sodium: 139 mmol/L (ref 135–145)
Total Bilirubin: 1.9 mg/dL — ABNORMAL HIGH (ref 0.3–1.2)
Total Bilirubin: 1.9 mg/dL — ABNORMAL HIGH (ref 0.3–1.2)
Total Protein: 5.9 g/dL — ABNORMAL LOW (ref 6.5–8.1)
Total Protein: 6.2 g/dL — ABNORMAL LOW (ref 6.5–8.1)

## 2019-12-27 LAB — CBC
HCT: 45.6 % (ref 36.0–46.0)
HCT: 46.2 % — ABNORMAL HIGH (ref 36.0–46.0)
Hemoglobin: 15.5 g/dL — ABNORMAL HIGH (ref 12.0–15.0)
Hemoglobin: 15.7 g/dL — ABNORMAL HIGH (ref 12.0–15.0)
MCH: 36.3 pg — ABNORMAL HIGH (ref 26.0–34.0)
MCH: 36.3 pg — ABNORMAL HIGH (ref 26.0–34.0)
MCHC: 34 g/dL (ref 30.0–36.0)
MCHC: 34 g/dL (ref 30.0–36.0)
MCV: 106.8 fL — ABNORMAL HIGH (ref 80.0–100.0)
MCV: 106.9 fL — ABNORMAL HIGH (ref 80.0–100.0)
Platelets: 36 10*3/uL — ABNORMAL LOW (ref 150–400)
Platelets: 39 10*3/uL — ABNORMAL LOW (ref 150–400)
RBC: 4.27 MIL/uL (ref 3.87–5.11)
RBC: 4.32 MIL/uL (ref 3.87–5.11)
RDW: 14.6 % (ref 11.5–15.5)
RDW: 14.6 % (ref 11.5–15.5)
WBC: 3.9 10*3/uL — ABNORMAL LOW (ref 4.0–10.5)
WBC: 5 10*3/uL (ref 4.0–10.5)
nRBC: 0 % (ref 0.0–0.2)
nRBC: 0 % (ref 0.0–0.2)

## 2019-12-27 LAB — GLUCOSE, CAPILLARY
Glucose-Capillary: 125 mg/dL — ABNORMAL HIGH (ref 70–99)
Glucose-Capillary: 304 mg/dL — ABNORMAL HIGH (ref 70–99)
Glucose-Capillary: 70 mg/dL (ref 70–99)
Glucose-Capillary: 89 mg/dL (ref 70–99)
Glucose-Capillary: 95 mg/dL (ref 70–99)

## 2019-12-27 LAB — MAGNESIUM: Magnesium: 2 mg/dL (ref 1.7–2.4)

## 2019-12-27 LAB — PHOSPHORUS: Phosphorus: <1 mg/dL — CL (ref 2.5–4.6)

## 2019-12-27 MED ORDER — LORAZEPAM 2 MG/ML IJ SOLN
4.0000 mg | Freq: Once | INTRAMUSCULAR | Status: AC
  Administered 2019-12-27: 07:00:00 4 mg via INTRAVENOUS

## 2019-12-27 MED ORDER — HALOPERIDOL LACTATE 5 MG/ML IJ SOLN
2.0000 mg | Freq: Four times a day (QID) | INTRAMUSCULAR | Status: DC | PRN
  Administered 2019-12-27 (×2): 2 mg via INTRAVENOUS
  Filled 2019-12-27 (×3): qty 1

## 2019-12-27 MED ORDER — LORAZEPAM 1 MG PO TABS: 1.0000 mg | ORAL_TABLET | ORAL | Status: AC | PRN

## 2019-12-27 MED ORDER — LORAZEPAM 2 MG/ML IJ SOLN
0.0000 mg | Freq: Four times a day (QID) | INTRAMUSCULAR | Status: AC
  Administered 2019-12-27 – 2019-12-28 (×4): 2 mg via INTRAVENOUS
  Administered 2019-12-28: 4 mg via INTRAVENOUS
  Administered 2019-12-28 – 2019-12-29 (×3): 2 mg via INTRAVENOUS
  Filled 2019-12-27 (×2): qty 1
  Filled 2019-12-27: qty 2
  Filled 2019-12-27 (×4): qty 1
  Filled 2019-12-27: qty 2

## 2019-12-27 MED ORDER — DIPHENHYDRAMINE HCL 50 MG/ML IJ SOLN
50.0000 mg | Freq: Once | INTRAMUSCULAR | Status: AC
  Administered 2019-12-27: 50 mg via INTRAVENOUS
  Filled 2019-12-27: qty 1

## 2019-12-27 MED ORDER — DIAZEPAM 5 MG/ML IJ SOLN
INTRAMUSCULAR | Status: AC
  Filled 2019-12-27: qty 2

## 2019-12-27 MED ORDER — KCL IN DEXTROSE-NACL 20-5-0.45 MEQ/L-%-% IV SOLN: INTRAVENOUS | Status: DC

## 2019-12-27 MED ORDER — LORAZEPAM 2 MG/ML IJ SOLN
1.0000 mg | INTRAMUSCULAR | Status: AC | PRN
  Administered 2019-12-27: 15:00:00 2 mg via INTRAVENOUS
  Administered 2019-12-27: 12:00:00 1 mg via INTRAVENOUS
  Administered 2019-12-27: 2 mg via INTRAVENOUS
  Administered 2019-12-27 – 2019-12-28 (×2): 4 mg via INTRAVENOUS
  Administered 2019-12-28: 2 mg via INTRAVENOUS
  Administered 2019-12-28: 07:00:00 4 mg via INTRAVENOUS
  Administered 2019-12-28: 12:00:00 2 mg via INTRAVENOUS
  Filled 2019-12-27 (×2): qty 1
  Filled 2019-12-27 (×2): qty 2
  Filled 2019-12-27: qty 1
  Filled 2019-12-27: qty 2
  Filled 2019-12-27 (×2): qty 1

## 2019-12-27 MED ORDER — KCL IN DEXTROSE-NACL 40-5-0.9 MEQ/L-%-% IV SOLN: INTRAVENOUS | Status: AC

## 2019-12-27 MED ORDER — DIAZEPAM 5 MG/ML IJ SOLN
10.0000 mg | Freq: Once | INTRAMUSCULAR | Status: AC
  Administered 2019-12-27: 06:00:00 10 mg via INTRAVENOUS

## 2019-12-27 MED ORDER — LORAZEPAM 2 MG/ML IJ SOLN
0.0000 mg | Freq: Two times a day (BID) | INTRAMUSCULAR | Status: DC
  Administered 2019-12-29 (×2): 2 mg via INTRAVENOUS
  Filled 2019-12-27 (×2): qty 1

## 2019-12-27 NOTE — Progress Notes (Signed)
Patient Demographics:    Martha Campbell, is a 44 y.o. female, DOB - 1976-10-27, [id number]  Admit date - 12/25/2019   Admitting Physician Javaeh Muscatello Denton Brick, MD  Outpatient Primary MD for the patient is Redmond School, MD  LOS - 2   Chief Complaint  Patient presents with  . Chest Pain        Subjective:    Martha Campbell today has no fevers, no emesis,  No chest pain,  -- -- Had a very restless night with severe agitation required restraints required IV Benadryl, Haldol and Valium from the night provider --Significant delirium tremors symptoms persist  Assessment  & Plan :    Principal Problem:   Delirium tremens with Seizure Active Problems:   Seizure ([place])   ETOH abuse   Tobacco abuse  Brief Summary:- 44 y.o. female with past medical history relevant for seasonal allergies, tobacco and alcohol abuse presents from PCPs office with tachycardia and subsequently had seizure episode in the ED consistent with alcohol withdrawal and was admitted 12/25/2019 for same   A/p 1)Severe delirium tremens/alcohol withdrawal with seizures--- CT head unremarkable, -Tachycardia, anxiety, agitation and restlessness persist despite high-dose benzos -Overnight required IV Benadryl, Haldol and Valium as well as physical restraints-----Resting a bit more now after interventions overnight -Continue IV lorazepam as ordered -CIWA protocol -Patient completed IV banana bag, continue  multivitamin as ordered -If severe symptoms persist will transfer to ICU for IV Precedex drip  2) alcohol abuse--patient drinks scotch daily, apparently was drinking less lately, now having alcohol withdrawals with seizures--management as above #1  3) tobacco abuse--- nicotine patch is ordered  4)Severe hyponatremia/ hypokalemia/hypomagnesemia in an alcoholic female--- sodium is up to 138 from 125,  potassium is up to 3.8 from 2.1 ,  magnesium is up to 2.3 from 1.4 -Continue iv  replacement and rechecking electrolytes -No vomiting no diarrhea  5)Thrombocytopenia--- platelet count 36k,  most likely from a combination of direct toxic effect of alcohol on the bone marrow and possibly some degree of liver cirrhosis -No bleeding concerns, continue to monitor  6)Recurrent Hypoglycemia--- due to poor oral intake in a patient with alcohol withdrawal and seizures and sedation from post ictal state and lorazepam -D5 solution as ordered, point-of-care fingersticks  7)Alcoholic Liver Disease--- AST to  ALT ratio is almost 4:1,  - AST down to 229 from 294, ALT is  down to 68 from 78   T bili is down to 1.9 from 3.4, albumin is 3.2 -Platelets low at 36K  Disposition/Need for in-Hospital Stay- patient unable to be discharged at this time due to --- severe/significant alcohol withdrawal/delirium tremens with seizures and hypoglycemia requiring IV lorazepam and IV fluids  Code Status : Full  Family Communication: Discussed with her Mother [name]-- at [phone]   Disposition Plan  : TBD  Consults  :  na  DVT Prophylaxis  :  - SCDs (low platelet)  Lab Results  Component Value Date   PLT 36 (L) 12/27/2019    Inpatient Medications  Scheduled Meds: . folic acid  1 mg Oral Daily  . influenza vac split quadrivalent PF  0.5 mL Intramuscular Tomorrow-1000  . loratadine  10 mg Oral Daily  . LORazepam  0-4 mg Intravenous Q6H  Followed by  . [START ON 12/29/2019] LORazepam  0-4 mg Intravenous Q12H  . multivitamin with minerals  1 tablet Oral Daily  . nicotine  14 mg Transdermal Daily  . pantoprazole  40 mg Oral Daily  . sodium chloride flush  3 mL Intravenous Q12H  . thiamine  100 mg Oral Daily   Or  . thiamine  100 mg Intravenous Daily   Continuous Infusions: . sodium chloride    . dextrose 5 % and 0.45 % NaCl with KCl 20 mEq/L    . dextrose 5 % and 0.9 % NaCl with KCl 40 mEq/L     PRN Meds:.sodium chloride,  acetaminophen **OR** acetaminophen, albuterol, alum & mag hydroxide-simeth, haloperidol lactate, LORazepam **OR** LORazepam, ondansetron **OR** ondansetron (ZOFRAN) IV, polyethylene glycol, sodium chloride flush, traZODone    Anti-infectives (From admission, onward)   None        Objective:   Vitals:   12/26/19 0630 12/26/19 1324 12/26/19 2331 12/27/19 0716  BP: 92/72 115/87 96/71 (!) 121/93  Pulse: 94 (!) 106 98 97  Resp: 18 20  18   Temp: 98.2 F (36.8 C) 98.8 F (37.1 C) 98.9 F (37.2 C) 98.8 F (37.1 C)  TempSrc: Oral Oral Oral Oral  SpO2: 94% 97% 98% 97%  Weight:      Height:        Wt Readings from Last 3 Encounters:  12/25/19 68 kg  06/16/18 68.9 kg     Intake/Output Summary (Last 24 hours) at 12/27/2019 0817 Last data filed at 12/27/2019 0700 Gross per 24 hour  Intake 2479.59 ml  Output 3100 ml  Net -620.41 ml     Physical Exam  Gen:-Lethargic after sedative medications, HEENT:- [place].AT, No sclera icterus, old frontal scalp scar Neck-Supple Neck ,No JVD,.  Lungs-  CTAB , fair symmetrical air movement CV- S1, S2 normal, regular , HR up to 150 with minimum activity Abd-  +ve B.Sounds, Abd Soft, No tenderness,    Extremity/Skin:- No  edema, pedal pulses present  NeuroPsych--- alternates from being very agitated / anxious / restless / uncooperative to being very lethargic and sedated after sedative medications ,  ----no new focal deficits, +ve  tremors   Data Review:   Micro Results Recent Results (from the past 240 hour(s))  SARS CORONAVIRUS 2 (TAT 6-24 HRS) Nasopharyngeal Nasopharyngeal Swab     Status: None   Collection Time: 12/25/19  1:38 PM   Specimen: Nasopharyngeal Swab  Result Value Ref Range Status   SARS Coronavirus 2 NEGATIVE NEGATIVE Final    Comment: (NOTE) SARS-CoV-2 target nucleic acids are NOT DETECTED. The SARS-CoV-2 RNA is generally detectable in upper and lower respiratory specimens during the acute phase of infection.  Negative results do not preclude SARS-CoV-2 infection, do not rule out co-infections with other pathogens, and should not be used as the sole basis for treatment or other patient management decisions. Negative results must be combined with clinical observations, patient history, and epidemiological information. The expected result is Negative. Fact Sheet for Patients: SugarRoll.be Fact Sheet for Healthcare Providers: https://www.woods-mathews.com/ This test is not yet approved or cleared by the [place] FDA and  has been authorized for detection and/or diagnosis of SARS-CoV-2 by FDA under an Emergency Use Authorization (EUA). This EUA will remain  in effect (meaning this test can be used) for the duration of the COVID-19 declaration under Section 56 4(b)(1) of the Act, 21 U.S.C. section 360bbb-3(b)(1), unless the authorization is terminated or revoked sooner. Performed at [facility], [place] [address], [place], [place] [address]     Radiology Reports DG Chest 2 View  Result Date: 12/25/2019 CLINICAL DATA:  Chest pain.  Recent fall EXAM: CHEST - 2 VIEW COMPARISON:  None. FINDINGS: Lungs are clear. Heart size and pulmonary vascularity are normal. No adenopathy. Small metallic foreign body in the anterior right chest wall. No bone lesions. No pneumothorax. IMPRESSION: No edema or consolidation. No pneumothorax. Cardiac silhouette within normal limits. Electronically Signed   By: Lowella Grip III M.D.   On: 12/25/2019 12:03   CT Head Wo Contrast  Result Date: 12/25/2019 CLINICAL DATA:  Seizure. Fall 2 weeks ago. EXAM: CT HEAD WITHOUT CONTRAST TECHNIQUE: Contiguous axial images were obtained from the base of the skull through the vertex without intravenous contrast. COMPARISON:  None. FINDINGS: Brain: There is residual intravascular contrast material from an earlier chest CTA. No acute intracranial hemorrhage is identified within this mild  limitation. No acute infarct, mass, midline shift, or extra-axial fluid collection is evident. The ventricles are normal in size. The cerebral and cerebellar sulci are slightly prominent for age likely reflecting mild parenchymal volume loss. Vascular: Calcified atherosclerosis at the skull base. Skull: No fracture or suspicious osseous lesion. Sinuses/Orbits: Visualized paranasal sinuses and mastoid air cells are clear. Visualized orbits are unremarkable. Other: Soft tissue defect in the midline frontal scalp. IMPRESSION: No evidence of acute intracranial abnormality. Electronically Signed   By: Logan Bores M.D.   On: 12/25/2019 16:41   CT Angio Chest PE W and/or Wo Contrast  Result Date: 12/25/2019 CLINICAL DATA:  PE suspected. Fall 2 weeks ago. Fell on right side. Persistent right-sided chest pain. EXAM: CT ANGIOGRAPHY CHEST WITH CONTRAST TECHNIQUE: Multidetector CT imaging of the chest was performed using the standard protocol during bolus administration of intravenous contrast. Multiplanar CT image reconstructions and MIPs were obtained to evaluate the vascular anatomy. CONTRAST:  151mL OMNIPAQUE IOHEXOL 350 MG/ML SOLN COMPARISON:  Two-view chest x-ray 12/25/2019 FINDINGS: Cardiovascular: The heart size is normal. The lung bases are clear. No significant pleural or pericardial effusion is present. Aorta and great vessel origins are within normal limits. Pulmonary artery opacification is excellent. No focal filling defects are present to suggest pulmonary embolus. Mediastinum/Nodes: No significant mediastinal, hilar, or axillary adenopathy is present. Metallic BB is located in the right breast. Lungs/Pleura: Lungs are clear without focal nodule, mass, or airspace disease. No significant pleural effusion or pneumothorax is present. Upper Abdomen: Diffuse fatty infiltration of the liver is noted. No focal lesions are present. Limited imaging of the upper abdomen is otherwise unremarkable. Musculoskeletal:  Superior endplate fractures at T3, T8, T9, and T12 are remote. Review of the MIP images confirms the above findings. IMPRESSION: 1. No evidence for pulmonary embolus. 2. No acute or focal lesion to explain the patient's symptoms. 3. Diffuse fatty infiltration of the liver. 4. Remote superior endplate fractures at T3, T8, T9, and T12. Electronically Signed   By: San Morelle M.D.   On: 12/25/2019 15:06     CBC Recent Labs  Lab 12/25/19 1227 12/26/19 0554 12/27/19 0454  WBC 4.7 4.6 3.9*  HGB 18.6* 15.0 15.5*  HCT 49.2* 42.3 45.6  PLT 39* 36* 36*  MCV 97.6 102.9* 106.8*  MCH 36.9* 36.5* 36.3*  MCHC 37.8* 35.5 34.0  RDW 13.6 14.1 14.6    Chemistries  Recent Labs  Lab 12/25/19 1227 12/25/19 1724 12/25/19 1930 12/26/19 0554 12/27/19 0454  NA 125*  --  132* 136 138  K 2.1*  --  3.8 3.1* 3.8  CL 74*  --  91* 97* 104  CO2 28  --  26 28 25   GLUCOSE 124*  --  69* 69* 70  BUN 10  --  8 7 <5*  CREATININE 0.67  --  0.47 0.52 0.54  CALCIUM 10.2  --  8.8* 8.1* 8.6*  MG 1.4*  --   --  2.3  --   AST  --  294*  --  224* 229*  ALT  --  78*  --  59* 68*  ALKPHOS  --  114  --  96 107  BILITOT  --  3.4*  --  2.6* 1.9*   ------------------------------------------------------------------------------------------------------------------ No results for input(s): CHOL, HDL, LDLCALC, TRIG, CHOLHDL, LDLDIRECT in the last 72 hours.  No results found for: HGBA1C ------------------------------------------------------------------------------------------------------------------ No results for input(s): TSH, T4TOTAL, T3FREE, THYROIDAB in the last 72 hours.  Invalid input(s): FREET3 ------------------------------------------------------------------------------------------------------------------ No results for input(s): VITAMINB12, FOLATE, FERRITIN, TIBC, IRON, RETICCTPCT in the last 72 hours.  Coagulation profile Recent Labs  Lab 12/25/19 1724  INR 0.9    Recent Labs    12/25/19 1227    DDIMER 0.59*    Cardiac Enzymes No results for input(s): CKMB, TROPONINI, MYOGLOBIN in the last 168 hours.  Invalid input(s): CK ------------------------------------------------------------------------------------------------------------------ No results found for: BNP   Roxan Hockey M.D on 12/27/2019 at 8:17 AM  Go to [email or web address] - for contact info  [facility] - Office  [phone]

## 2019-12-27 NOTE — Progress Notes (Signed)
Patient progressively became more agitated.  IV Ativan ineffective.  MD paged new medications ordered and given.  Security called to assist with patient.  Patient still pulling lines and trying to get out of bed.  MD paged again and new orders received and given.  Patient becoming more agitated even with the medications.  MD paged and order for restraints given and applied.  Patient currently resting.  Will continue to monitor.

## 2019-12-28 LAB — COMPREHENSIVE METABOLIC PANEL
ALT: 64 U/L — ABNORMAL HIGH (ref 0–44)
AST: 132 U/L — ABNORMAL HIGH (ref 15–41)
Albumin: 3.2 g/dL — ABNORMAL LOW (ref 3.5–5.0)
Alkaline Phosphatase: 110 U/L (ref 38–126)
Anion gap: 7 (ref 5–15)
BUN: <5 mg/dL — ABNORMAL LOW (ref 6–20)
CO2: 23 mmol/L (ref 22–32)
Calcium: 8.9 mg/dL (ref 8.9–10.3)
Chloride: 106 mmol/L (ref 98–111)
Creatinine, Ser: 0.46 mg/dL (ref 0.44–1.00)
GFR calc Af Amer: >60 mL/min (ref >60)
GFR calc non Af Amer: >60 mL/min (ref >60)
Glucose, Bld: 97 mg/dL (ref 70–99)
Potassium: 4.6 mmol/L (ref 3.5–5.1)
Sodium: 136 mmol/L (ref 135–145)
Total Bilirubin: 1.9 mg/dL — ABNORMAL HIGH (ref 0.3–1.2)
Total Protein: 6.1 g/dL — ABNORMAL LOW (ref 6.5–8.1)

## 2019-12-28 LAB — MAGNESIUM: Magnesium: 1.6 mg/dL — ABNORMAL LOW (ref 1.7–2.4)

## 2019-12-28 LAB — CBC
HCT: 45.9 % (ref 36.0–46.0)
Hemoglobin: 15.7 g/dL — ABNORMAL HIGH (ref 12.0–15.0)
MCH: 36.3 pg — ABNORMAL HIGH (ref 26.0–34.0)
MCHC: 34.2 g/dL (ref 30.0–36.0)
MCV: 106.3 fL — ABNORMAL HIGH (ref 80.0–100.0)
Platelets: 55 10*3/uL — ABNORMAL LOW (ref 150–400)
RBC: 4.32 MIL/uL (ref 3.87–5.11)
RDW: 14.5 % (ref 11.5–15.5)
WBC: 4 10*3/uL (ref 4.0–10.5)
nRBC: 0 % (ref 0.0–0.2)

## 2019-12-28 LAB — GLUCOSE, CAPILLARY
Glucose-Capillary: 105 mg/dL — ABNORMAL HIGH (ref 70–99)
Glucose-Capillary: 79 mg/dL (ref 70–99)
Glucose-Capillary: 112 mg/dL — ABNORMAL HIGH (ref 70–99)
Glucose-Capillary: 95 mg/dL (ref 70–99)

## 2019-12-28 MED ORDER — K PHOS MONO-SOD PHOS DI & MONO 155-852-130 MG PO TABS
500.0000 mg | ORAL_TABLET | Freq: Three times a day (TID) | ORAL | Status: DC
  Administered 2019-12-28 (×2): 500 mg via ORAL
  Filled 2019-12-28 (×2): qty 2

## 2019-12-28 MED ORDER — SODIUM PHOSPHATES 45 MMOLE/15ML IV SOLN
30.0000 mmol | Freq: Once | INTRAVENOUS | Status: AC
  Administered 2019-12-28: 10:00:00 30 mmol via INTRAVENOUS
  Filled 2019-12-28: qty 10

## 2019-12-28 MED ORDER — MAGNESIUM SULFATE 2 GM/50ML IV SOLN
2.0000 g | Freq: Once | INTRAVENOUS | Status: AC
  Administered 2019-12-28: 2 g via INTRAVENOUS
  Filled 2019-12-28: qty 50

## 2019-12-28 NOTE — Progress Notes (Addendum)
Patient Demographics:    Martha Campbell, is a 44 y.o. female, DOB - 04-21-1976, VN:[id number]  Admit date - 12/25/2019   Admitting Physician Lanisha Stepanian Denton Brick, MD  Outpatient Primary MD for the patient is Redmond School, MD  LOS - 3   Chief Complaint  Patient presents with  . Chest Pain        Subjective:    Martha Campbell today has no fevers, no emesis,  No chest pain,  -- -- Less restless, more cooperative, -Tachycardia persist, occasional episodes of confusion and disorientation persist  Assessment  & Plan :    Principal Problem:   Delirium tremens with Seizure Active Problems:   Seizure ([place])   ETOH abuse   Tobacco abuse  Brief Summary:- 44 y.o. female with past medical history relevant for seasonal allergies, tobacco and alcohol abuse presents from PCPs office with tachycardia and subsequently had seizure episode in the ED consistent with alcohol withdrawal and was admitted 12/25/2019 for same -DTs improving but not resolved   A/p 1)Severe delirium tremens/alcohol withdrawal with seizures--- CT head unremarkable, -Tachycardia, anxiety, agitation and restlessness improving very slowly with high-dose benzos --Okay to discontinue restraints, may use telemetry sitter or one-to-one sitter -Continue IV lorazepam as ordered -CIWA protocol -Patient completed IV banana bag, continue  multivitamin as ordered -Overall DTs improving slowly but not resolved  2) alcohol abuse--patient drinks scotch daily, apparently was drinking less lately, now having alcohol withdrawals with seizures--management as above #1  3) tobacco abuse--- nicotine patch is ordered  4)Severe hyponatremia/ hypokalemia/hypomagnesemia/hypophosphatemia in an alcoholic female--- sodium is up to 136 from 125,  potassium is up to 4.6 from 2.1 , magnesium is down to 1.6 from 2.3 -Continue iv and oral  replacement of electrolytes  including magnesium and phosphorus and rechecking electrolytes -No vomiting no diarrhea  5)Thrombocytopenia--- platelet count 36k,  most likely from a combination of direct toxic effect of alcohol on the bone marrow and possibly some degree of liver cirrhosis -No bleeding concerns, continue to monitor  6)Recurrent Hypoglycemia--- due to poor oral intake in a patient with alcohol withdrawal and seizures and sedation from post ictal state and lorazepam -Hypoglycemia mostly resolved with dextrose infusion and increasing oral intake, do POC fingersticks  7)Alcoholic Liver Disease--- AST to  ALT ratio is almost 4:1,  - AST down to 132 from 294, ALT is  down to 64 from 78   T bili is down to 1.9 from 3.4, albumin is 3.2 -Platelets up to 55 from  36K  Disposition/Need for in-Hospital Stay- patient unable to be discharged at this time due to ---  alcohol withdrawal/delirium tremens with seizures and hypoglycemia requiring IV lorazepam and IV fluids --Overall DTs improving slowly but not resolved  Code Status : Full  Family Communication: Discussed with her Mother [name]-- at [phone]   Disposition Plan  : TBD  Consults  :  na  DVT Prophylaxis  :  - SCDs (low platelet)  Lab Results  Component Value Date   PLT 55 (L) 12/28/2019    Inpatient Medications  Scheduled Meds: . folic acid  1 mg Oral Daily  . loratadine  10 mg Oral Daily  . LORazepam  0-4 mg Intravenous Q6H   Followed by  . [START  ON 12/29/2019] LORazepam  0-4 mg Intravenous Q12H  . multivitamin with minerals  1 tablet Oral Daily  . nicotine  14 mg Transdermal Daily  . pantoprazole  40 mg Oral Daily  . phosphorus  500 mg Oral TID  . sodium chloride flush  3 mL Intravenous Q12H  . thiamine  100 mg Oral Daily   Or  . thiamine  100 mg Intravenous Daily   Continuous Infusions: . sodium chloride    . dextrose 5 % and 0.45 % NaCl with KCl 20 mEq/L 100 mL/hr at 12/28/19 0659  . magnesium sulfate bolus IVPB    .  sodium phosphate  Dextrose 5% IVPB 30 mmol (12/28/19 0934)   PRN Meds:.sodium chloride, acetaminophen **OR** acetaminophen, albuterol, alum & mag hydroxide-simeth, haloperidol lactate, LORazepam **OR** LORazepam, ondansetron **OR** ondansetron (ZOFRAN) IV, polyethylene glycol, sodium chloride flush, traZODone    Anti-infectives (From admission, onward)   None        Objective:   Vitals:   12/27/19 2040 12/28/19 0000 12/28/19 0611 12/28/19 0910  BP:  119/79 (!) 117/95 116/81  Pulse:  89 95 (!) 125  Resp:  16 17 16   Temp:  99 F (37.2 C) 98.9 F (37.2 C) 97.9 F (36.6 C)  TempSrc:  Axillary Oral Oral  SpO2: 98% 97% 99% 100%  Weight:      Height:        Wt Readings from Last 3 Encounters:  12/25/19 68 kg  06/16/18 68.9 kg     Intake/Output Summary (Last 24 hours) at 12/28/2019 1250 Last data filed at 12/28/2019 0300 Gross per 24 hour  Intake 1714.92 ml  Output 1600 ml  Net 114.92 ml     Physical Exam  Gen:-Awake, HEENT:- [place].AT, No sclera icterus, old frontal scalp scar Neck-Supple Neck ,No JVD,.  Lungs-  CTAB , fair symmetrical air movement CV- S1, S2 normal, regular ,  Tachycardia  abd-  +ve B.Sounds, Abd Soft, No tenderness,    Extremity/Skin:- No  edema, pedal pulses present  NeuroPsych--- episodes of confusion and disorientation persist, very anxious----no new focal deficits, +ve  tremors   Data Review:   Micro Results Recent Results (from the past 240 hour(s))  SARS CORONAVIRUS 2 (TAT 6-24 HRS) Nasopharyngeal Nasopharyngeal Swab     Status: None   Collection Time: 12/25/19  1:38 PM   Specimen: Nasopharyngeal Swab  Result Value Ref Range Status   SARS Coronavirus 2 NEGATIVE NEGATIVE Final    Comment: (NOTE) SARS-CoV-2 target nucleic acids are NOT DETECTED. The SARS-CoV-2 RNA is generally detectable in upper and lower respiratory specimens during the acute phase of infection. Negative results do not preclude SARS-CoV-2 infection, do not rule  out co-infections with other pathogens, and should not be used as the sole basis for treatment or other patient management decisions. Negative results must be combined with clinical observations, patient history, and epidemiological information. The expected result is Negative. Fact Sheet for Patients: SugarRoll.be Fact Sheet for Healthcare Providers: https://www.woods-mathews.com/ This test is not yet approved or cleared by the [place] FDA and  has been authorized for detection and/or diagnosis of SARS-CoV-2 by FDA under an Emergency Use Authorization (EUA). This EUA will remain  in effect (meaning this test can be used) for the duration of the COVID-19 declaration under Section 56 4(b)(1) of the Act, 21 U.S.C. section 360bbb-3(b)(1), unless the authorization is terminated or revoked sooner. Performed at [facility], [place] [address], [place], [place] [address]     Radiology  Reports DG Chest 2 View  Result Date: 12/25/2019 CLINICAL DATA:  Chest pain.  Recent fall EXAM: CHEST - 2 VIEW COMPARISON:  None. FINDINGS: Lungs are clear. Heart size and pulmonary vascularity are normal. No adenopathy. Small metallic foreign body in the anterior right chest wall. No bone lesions. No pneumothorax. IMPRESSION: No edema or consolidation. No pneumothorax. Cardiac silhouette within normal limits. Electronically Signed   By: Lowella Grip III M.D.   On: 12/25/2019 12:03   CT Head Wo Contrast  Result Date: 12/25/2019 CLINICAL DATA:  Seizure. Fall 2 weeks ago. EXAM: CT HEAD WITHOUT CONTRAST TECHNIQUE: Contiguous axial images were obtained from the base of the skull through the vertex without intravenous contrast. COMPARISON:  None. FINDINGS: Brain: There is residual intravascular contrast material from an earlier chest CTA. No acute intracranial hemorrhage is identified within this mild limitation. No acute infarct, mass, midline shift, or extra-axial  fluid collection is evident. The ventricles are normal in size. The cerebral and cerebellar sulci are slightly prominent for age likely reflecting mild parenchymal volume loss. Vascular: Calcified atherosclerosis at the skull base. Skull: No fracture or suspicious osseous lesion. Sinuses/Orbits: Visualized paranasal sinuses and mastoid air cells are clear. Visualized orbits are unremarkable. Other: Soft tissue defect in the midline frontal scalp. IMPRESSION: No evidence of acute intracranial abnormality. Electronically Signed   By: Logan Bores M.D.   On: 12/25/2019 16:41   CT Angio Chest PE W and/or Wo Contrast  Result Date: 12/25/2019 CLINICAL DATA:  PE suspected. Fall 2 weeks ago. Fell on right side. Persistent right-sided chest pain. EXAM: CT ANGIOGRAPHY CHEST WITH CONTRAST TECHNIQUE: Multidetector CT imaging of the chest was performed using the standard protocol during bolus administration of intravenous contrast. Multiplanar CT image reconstructions and MIPs were obtained to evaluate the vascular anatomy. CONTRAST:  14mL OMNIPAQUE IOHEXOL 350 MG/ML SOLN COMPARISON:  Two-view chest x-ray 12/25/2019 FINDINGS: Cardiovascular: The heart size is normal. The lung bases are clear. No significant pleural or pericardial effusion is present. Aorta and great vessel origins are within normal limits. Pulmonary artery opacification is excellent. No focal filling defects are present to suggest pulmonary embolus. Mediastinum/Nodes: No significant mediastinal, hilar, or axillary adenopathy is present. Metallic BB is located in the right breast. Lungs/Pleura: Lungs are clear without focal nodule, mass, or airspace disease. No significant pleural effusion or pneumothorax is present. Upper Abdomen: Diffuse fatty infiltration of the liver is noted. No focal lesions are present. Limited imaging of the upper abdomen is otherwise unremarkable. Musculoskeletal: Superior endplate fractures at T3, T8, T9, and T12 are remote. Review  of the MIP images confirms the above findings. IMPRESSION: 1. No evidence for pulmonary embolus. 2. No acute or focal lesion to explain the patient's symptoms. 3. Diffuse fatty infiltration of the liver. 4. Remote superior endplate fractures at T3, T8, T9, and T12. Electronically Signed   By: San Morelle M.D.   On: 12/25/2019 15:06     CBC Recent Labs  Lab 12/25/19 1227 12/26/19 0554 12/27/19 0454 12/27/19 0943 12/28/19 0719  WBC 4.7 4.6 3.9* 5.0 4.0  HGB 18.6* 15.0 15.5* 15.7* 15.7*  HCT 49.2* 42.3 45.6 46.2* 45.9  PLT 39* 36* 36* 39* 55*  MCV 97.6 102.9* 106.8* 106.9* 106.3*  MCH 36.9* 36.5* 36.3* 36.3* 36.3*  MCHC 37.8* 35.5 34.0 34.0 34.2  RDW 13.6 14.1 14.6 14.6 14.5    Chemistries  Recent Labs  Lab 12/25/19 1227 12/25/19 1724 12/25/19 1930 12/26/19 0554 12/27/19 0454 12/27/19 [id number] 12/28/19 0719 12/28/19 [id number]  NA 125*  --  132* 136 138 139 136  --   K 2.1*  --  3.8 3.1* 3.8 4.1 4.6  --   CL 74*  --  91* 97* 104 107 106  --   CO2 28  --  26 28 25 23 23   --   GLUCOSE 124*  --  69* 69* 70 67* 97  --   BUN 10  --  8 7 <5* <5* <5*  --   CREATININE 0.67  --  0.47 0.52 0.54 0.54 0.46  --   CALCIUM 10.2  --  8.8* 8.1* 8.6* 8.5* 8.9  --   MG 1.4*  --   --  2.3  --  2.0  --  1.6*  AST  --  294*  --  224* 229* 215* 132*  --   ALT  --  78*  --  59* 68* 71* 64*  --   ALKPHOS  --  114  --  96 107 112 110  --   BILITOT  --  3.4*  --  2.6* 1.9* 1.9* 1.9*  --    ------------------------------------------------------------------------------------------------------------------ No results for input(s): CHOL, HDL, LDLCALC, TRIG, CHOLHDL, LDLDIRECT in the last 72 hours.  No results found for: HGBA1C ------------------------------------------------------------------------------------------------------------------ No results for input(s): TSH, T4TOTAL, T3FREE, THYROIDAB in the last 72 hours.  Invalid input(s):  FREET3 ------------------------------------------------------------------------------------------------------------------ No results for input(s): VITAMINB12, FOLATE, FERRITIN, TIBC, IRON, RETICCTPCT in the last 72 hours.  Coagulation profile Recent Labs  Lab 12/25/19 1724  INR 0.9    No results for input(s): DDIMER in the last 72 hours.  Cardiac Enzymes No results for input(s): CKMB, TROPONINI, MYOGLOBIN in the last 168 hours.  Invalid input(s): CK ------------------------------------------------------------------------------------------------------------------ No results found for: BNP   Roxan Hockey M.D on 12/28/2019 at 12:50 PM  Go to [email or web address] - for contact info  [facility] - Office  [phone]

## 2019-12-28 NOTE — Progress Notes (Signed)
MEDICATION RELATED CONSULT NOTE - INITIAL   Pharmacy Consult for IV phosphorus Indication: Phos <1.0  Allergies  Allergen Reactions  . Penicillins     Did it involve swelling of the face/tongue/throat, SOB, or low BP? Unknown Did it involve sudden or severe rash/hives, skin peeling, or any reaction on the inside of your mouth or nose? Unknown Did you need to seek medical attention at a hospital or doctor's office? Unknown When did it last happen? If all above answers are "NO", may proceed with cephalosporin use.     Patient Measurements: Height: 5\' 5"  (165.1 cm) Weight: 150 lb (68 kg) IBW/kg (Calculated) : 57  Vital Signs: Temp: 98.9 F (37.2 C) (01/07 0611) Temp Source: Oral (01/07 0611) BP: 117/95 (01/07 [id number]) Pulse Rate: 95 (01/07 0611) Intake/Output from previous day: 01/06 0701 - 01/07 0700 In: 1714.9 [P.O.:60; I.V.:1654.9] Out: 1600 [Urine:1600] Intake/Output from this shift: No intake/output data recorded.  Labs: Recent Labs    12/25/19 1227 12/25/19 1724 12/25/19 1728 12/26/19 0554 12/27/19 0454 12/27/19 0943 12/28/19 0719  WBC 4.7  --   --  4.6 3.9* 5.0 4.0  HGB 18.6*  --   --  15.0 15.5* 15.7* 15.7*  HCT 49.2*  --   --  42.3 45.6 46.2* 45.9  PLT 39*  --   --  36* 36* 39* 55*  CREATININE 0.67  --   --  0.52 0.54 0.54 0.46  MG 1.4*  --   --  2.3  --  2.0  --   PHOS  --   --  2.7  --   --  <1.0*  --   ALBUMIN  --  3.5  --  3.0* 3.2* 3.4* 3.2*  PROT  --  6.6  --  5.6* 5.9* 6.2* 6.1*  AST  --  294*  --  224* 229* 215* 132*  ALT  --  78*  --  59* 68* 71* 64*  ALKPHOS  --  114  --  96 107 112 110  BILITOT  --  3.4*  --  2.6* 1.9* 1.9* 1.9*  BILIDIR  --  1.2*  --   --   --   --   --   IBILI  --  2.2*  --   --   --   --   --    Estimated Creatinine Clearance: 79.9 mL/min (by C-G formula based on SCr of 0.46 mg/dL).   Microbiology: Recent Results (from the past 720 hour(s))  SARS CORONAVIRUS 2 (TAT 6-24 HRS) Nasopharyngeal Nasopharyngeal Swab      Status: None   Collection Time: 12/25/19  1:38 PM   Specimen: Nasopharyngeal Swab  Result Value Ref Range Status   SARS Coronavirus 2 NEGATIVE NEGATIVE Final    Comment: (NOTE) SARS-CoV-2 target nucleic acids are NOT DETECTED. The SARS-CoV-2 RNA is generally detectable in upper and lower respiratory specimens during the acute phase of infection. Negative results do not preclude SARS-CoV-2 infection, do not rule out co-infections with other pathogens, and should not be used as the sole basis for treatment or other patient management decisions. Negative results must be combined with clinical observations, patient history, and epidemiological information. The expected result is Negative. Fact Sheet for Patients: SugarRoll.be Fact Sheet for Healthcare Providers: https://www.woods-mathews.com/ This test is not yet approved or cleared by the [place] FDA and  has been authorized for detection and/or diagnosis of SARS-CoV-2 by FDA under an Emergency Use Authorization (EUA). This EUA will remain  in effect (  meaning this test can be used) for the duration of the COVID-19 declaration under Section 56 4(b)(1) of the Act, 21 U.S.C. section 360bbb-3(b)(1), unless the authorization is terminated or revoked sooner. Performed at [facility], [place] [address], [place], [place] [address]     Medical History: Past Medical History:  Diagnosis Date  . Anxiety   . Skin cancer     Medications:  No medications prior to admission.    Assessment: Pharmacy consulted to dose IV phosphorus for patient with phos level <1.0.  K 4.1   Plan:  Sodium phosphate 30 mmol IV x 1 dose. Monitor phosphorus level in AM.  Ramond Craver 12/28/2019,8:25 AM

## 2019-12-29 LAB — COMPREHENSIVE METABOLIC PANEL
ALT: 64 U/L — ABNORMAL HIGH (ref 0–44)
AST: 102 U/L — ABNORMAL HIGH (ref 15–41)
Albumin: 3.3 g/dL — ABNORMAL LOW (ref 3.5–5.0)
Alkaline Phosphatase: 106 U/L (ref 38–126)
Anion gap: 7 (ref 5–15)
BUN: <5 mg/dL — ABNORMAL LOW (ref 6–20)
CO2: 26 mmol/L (ref 22–32)
Calcium: 8.9 mg/dL (ref 8.9–10.3)
Chloride: 106 mmol/L (ref 98–111)
Creatinine, Ser: 0.56 mg/dL (ref 0.44–1.00)
GFR calc Af Amer: >60 mL/min (ref >60)
GFR calc non Af Amer: >60 mL/min (ref >60)
Glucose, Bld: 86 mg/dL (ref 70–99)
Potassium: 3.7 mmol/L (ref 3.5–5.1)
Sodium: 139 mmol/L (ref 135–145)
Total Bilirubin: 1.4 mg/dL — ABNORMAL HIGH (ref 0.3–1.2)
Total Protein: 6.2 g/dL — ABNORMAL LOW (ref 6.5–8.1)

## 2019-12-29 LAB — CBC
HCT: 46.5 % — ABNORMAL HIGH (ref 36.0–46.0)
Hemoglobin: 16 g/dL — ABNORMAL HIGH (ref 12.0–15.0)
MCH: 36.4 pg — ABNORMAL HIGH (ref 26.0–34.0)
MCHC: 34.4 g/dL (ref 30.0–36.0)
MCV: 105.7 fL — ABNORMAL HIGH (ref 80.0–100.0)
Platelets: 99 10*3/uL — ABNORMAL LOW (ref 150–400)
RBC: 4.4 MIL/uL (ref 3.87–5.11)
RDW: 14.8 % (ref 11.5–15.5)
WBC: 4.2 10*3/uL (ref 4.0–10.5)
nRBC: 0 % (ref 0.0–0.2)

## 2019-12-29 LAB — PHOSPHORUS: Phosphorus: 4.4 mg/dL (ref 2.5–4.6)

## 2019-12-29 LAB — GLUCOSE, CAPILLARY
Glucose-Capillary: 106 mg/dL — ABNORMAL HIGH (ref 70–99)
Glucose-Capillary: 155 mg/dL — ABNORMAL HIGH (ref 70–99)

## 2019-12-29 LAB — MAGNESIUM: Magnesium: 2 mg/dL (ref 1.7–2.4)

## 2019-12-29 MED ORDER — DEXTROSE-NACL 5-0.9 % IV SOLN: INTRAVENOUS | Status: DC

## 2019-12-29 MED ORDER — HALOPERIDOL LACTATE 5 MG/ML IJ SOLN: 2.0000 mg | Freq: Four times a day (QID) | INTRAMUSCULAR | Status: DC | PRN

## 2019-12-29 NOTE — Progress Notes (Signed)
Patient Demographics:    Martha Campbell, is a 44 y.o. female, DOB - [birth year], VN:[id number]  Admit date - 12/25/2019   Admitting Physician Courage Denton Brick, MD  Outpatient Primary MD for the patient is Redmond School, MD  LOS - 4   Chief Complaint  Patient presents with  . Chest Pain        Subjective:    [name] today has no fevers, no emesis,  No chest pain,  -- -- Less restless but tries to get out of bed. Cooperative.  -Tachycardia persist, occasional episodes of confusion and disorientation persist Wants to go home.   Assessment  & Plan :    Principal Problem:   Delirium tremens with Seizure Active Problems:   Seizure ([place])   ETOH abuse   Tobacco abuse  Brief Summary:- 44 y.o. female with past medical history relevant for seasonal allergies, tobacco and alcohol abuse presents from PCPs office with tachycardia and subsequently had seizure episode in the ED consistent with alcohol withdrawal and was admitted 12/25/2019 for same  A/p 1)Severe delirium tremens/alcohol withdrawal with seizures--- CT head unremarkable, -Tachycardia, anxiety, agitation and restlessness improving  -restraints were placed again today, since she was confused; trying to get out of bed and lack of adequate staff (observer at bedside) to provide constant verbal redirection  -Continue IV lorazepam as ordered -CIWA protocol -Patient completed IV banana bag, continue  multivitamin as ordered  2) alcohol abuse--patient drinks scotch daily, apparently was drinking less lately, now having alcohol withdrawals with seizures--management as above #1  3) tobacco abuse--- nicotine patch is ordered  4)Severe hyponatremia/ hypokalemia/hypomagnesemia/hypophosphatemia in an alcoholic female- Resolved with IV oral/ replacement -Continue iv and oral  replacement of electrolytes including magnesium and phosphorus and rechecking  electrolytes -No vomiting no diarrhea  5)Thrombocytopenia--- platelet count improved to 99000,  most likely from a combination of direct toxic effect of alcohol on the bone marrow and possibly some degree of liver cirrhosis -No bleeding concerns, continue to monitor  6)Recurrent Hypoglycemia--- due to poor oral intake in a patient with alcohol withdrawal and seizures and sedation from post ictal state and lorazepam -Hypoglycemia mostly resolved with dextrose infusion and increasing oral intake  7)Elevated LFTs Secondary to alcoholic liver disease  Improving   Disposition/Need for in-Hospital Stay- patient unable to be discharged at this time due to ---  alcohol withdrawal/delirium tremens with seizures and hypoglycemia requiring IV lorazepam and IV fluids --Overall DTs improving slowly but not resolved  Code Status : Full  Family Communication: updated Mother [name]-- at [phone]    Disposition Plan  : TBD  Consults  :  na  DVT Prophylaxis  :  - SCDs (low platelet)  Lab Results  Component Value Date   PLT 99 (L) 12/29/2019    Inpatient Medications  Scheduled Meds: . folic acid  1 mg Oral Daily  . loratadine  10 mg Oral Daily  . LORazepam  0-4 mg Intravenous Q12H  . multivitamin with minerals  1 tablet Oral Daily  . nicotine  14 mg Transdermal Daily  . pantoprazole  40 mg Oral Daily  . sodium chloride flush  3 mL Intravenous Q12H  . thiamine  100 mg Oral Daily   Or  . thiamine  100 mg Intravenous Daily   Continuous Infusions: . sodium chloride    . dextrose 5 % and 0.9% NaCl 75 mL/hr at 12/29/19 1502   PRN Meds:.sodium chloride, acetaminophen **OR** acetaminophen, albuterol, alum & mag hydroxide-simeth, haloperidol lactate, LORazepam **OR** LORazepam, ondansetron **OR** ondansetron (ZOFRAN) IV, polyethylene glycol, sodium chloride flush, traZODone    Anti-infectives (From admission, onward)   None        Objective:   Vitals:   12/29/19 0500  12/29/19 1200 12/29/19 1342 12/29/19 1800  BP: (!) 82/61 99/60 123/77 136/82  Pulse:  87 93 99  Resp: 16 15 17 19   Temp: 98.3 F (36.8 C) 98.3 F (36.8 C) 98.5 F (36.9 C) 98 F (36.7 C)  TempSrc: Oral Oral Oral Oral  SpO2: 98% 97% 99% 97%  Weight:      Height:        Wt Readings from Last 3 Encounters:  12/25/19 68 kg  06/16/18 68.9 kg     Intake/Output Summary (Last 24 hours) at 12/29/2019 1834 Last data filed at 12/29/2019 1800 Gross per 24 hour  Intake 700.97 ml  Output 1750 ml  Net -1049.03 ml     Physical Exam  Gen:-Awake, HEENT:- [place].AT, No sclera icterus, old frontal scalp scar Neck-Supple Neck ,No JVD,.  Lungs-  CTAB , fair symmetrical air movement CV- S1, S2 normal, regular ,  Tachycardia  abd-  +ve B.Sounds, Abd Soft, No tenderness,    Extremity/Skin:- No  edema, pedal pulses present  NeuroPsych--- anxious, tearful----no new focal deficits, +ve  tremors   Data Review:   Micro Results Recent Results (from the past 240 hour(s))  SARS CORONAVIRUS 2 (TAT 6-24 HRS) Nasopharyngeal Nasopharyngeal Swab     Status: None   Collection Time: 12/25/19  1:38 PM   Specimen: Nasopharyngeal Swab  Result Value Ref Range Status   SARS Coronavirus 2 NEGATIVE NEGATIVE Final    Comment: (NOTE) SARS-CoV-2 target nucleic acids are NOT DETECTED. The SARS-CoV-2 RNA is generally detectable in upper and lower respiratory specimens during the acute phase of infection. Negative results do not preclude SARS-CoV-2 infection, do not rule out co-infections with other pathogens, and should not be used as the sole basis for treatment or other patient management decisions. Negative results must be combined with clinical observations, patient history, and epidemiological information. The expected result is Negative. Fact Sheet for Patients: SugarRoll.be Fact Sheet for Healthcare Providers: https://www.woods-mathews.com/ This test is not yet  approved or cleared by the [place] FDA and  has been authorized for detection and/or diagnosis of SARS-CoV-2 by FDA under an Emergency Use Authorization (EUA). This EUA will remain  in effect (meaning this test can be used) for the duration of the COVID-19 declaration under Section 56 4(b)(1) of the Act, 21 U.S.C. section 360bbb-3(b)(1), unless the authorization is terminated or revoked sooner. Performed at [facility], [place] [address] [place], [place] [address]     Radiology Reports DG Chest 2 View  Result Date: 12/25/2019 CLINICAL DATA:  Chest pain.  Recent fall EXAM: CHEST - 2 VIEW COMPARISON:  None. FINDINGS: Lungs are clear. Heart size and pulmonary vascularity are normal. No adenopathy. Small metallic foreign body in the anterior right chest wall. No bone lesions. No pneumothorax. IMPRESSION: No edema or consolidation. No pneumothorax. Cardiac silhouette within normal limits. Electronically Signed   By: Lowella Grip III M.D.   On: 12/25/2019 12:03   CT Head Wo Contrast  Result Date: 12/25/2019 CLINICAL DATA:  Seizure. Fall 2  weeks ago. EXAM: CT HEAD WITHOUT CONTRAST TECHNIQUE: Contiguous axial images were obtained from the base of the skull through the vertex without intravenous contrast. COMPARISON:  None. FINDINGS: Brain: There is residual intravascular contrast material from an earlier chest CTA. No acute intracranial hemorrhage is identified within this mild limitation. No acute infarct, mass, midline shift, or extra-axial fluid collection is evident. The ventricles are normal in size. The cerebral and cerebellar sulci are slightly prominent for age likely reflecting mild parenchymal volume loss. Vascular: Calcified atherosclerosis at the skull base. Skull: No fracture or suspicious osseous lesion. Sinuses/Orbits: Visualized paranasal sinuses and mastoid air cells are clear. Visualized orbits are unremarkable. Other: Soft tissue defect in the midline frontal scalp.  IMPRESSION: No evidence of acute intracranial abnormality. Electronically Signed   By: Logan Bores M.D.   On: 12/25/2019 16:41   CT Angio Chest PE W and/or Wo Contrast  Result Date: 12/25/2019 CLINICAL DATA:  PE suspected. Fall 2 weeks ago. Fell on right side. Persistent right-sided chest pain. EXAM: CT ANGIOGRAPHY CHEST WITH CONTRAST TECHNIQUE: Multidetector CT imaging of the chest was performed using the standard protocol during bolus administration of intravenous contrast. Multiplanar CT image reconstructions and MIPs were obtained to evaluate the vascular anatomy. CONTRAST:  174mL OMNIPAQUE IOHEXOL 350 MG/ML SOLN COMPARISON:  Two-view chest x-ray 12/25/2019 FINDINGS: Cardiovascular: The heart size is normal. The lung bases are clear. No significant pleural or pericardial effusion is present. Aorta and great vessel origins are within normal limits. Pulmonary artery opacification is excellent. No focal filling defects are present to suggest pulmonary embolus. Mediastinum/Nodes: No significant mediastinal, hilar, or axillary adenopathy is present. Metallic BB is located in the right breast. Lungs/Pleura: Lungs are clear without focal nodule, mass, or airspace disease. No significant pleural effusion or pneumothorax is present. Upper Abdomen: Diffuse fatty infiltration of the liver is noted. No focal lesions are present. Limited imaging of the upper abdomen is otherwise unremarkable. Musculoskeletal: Superior endplate fractures at T3, T8, T9, and T12 are remote. Review of the MIP images confirms the above findings. IMPRESSION: 1. No evidence for pulmonary embolus. 2. No acute or focal lesion to explain the patient's symptoms. 3. Diffuse fatty infiltration of the liver. 4. Remote superior endplate fractures at T3, T8, T9, and T12. Electronically Signed   By: San Morelle M.D.   On: 12/25/2019 15:06     CBC Recent Labs  Lab 12/26/19 0554 12/27/19 0454 12/27/19 0943 12/28/19 0719 12/29/19 0706    WBC 4.6 3.9* 5.0 4.0 4.2  HGB 15.0 15.5* 15.7* 15.7* 16.0*  HCT 42.3 45.6 46.2* 45.9 46.5*  PLT 36* 36* 39* 55* 99*  MCV 102.9* 106.8* 106.9* 106.3* 105.7*  MCH 36.5* 36.3* 36.3* 36.3* 36.4*  MCHC 35.5 34.0 34.0 34.2 34.4  RDW 14.1 14.6 14.6 14.5 14.8    Chemistries  Recent Labs  Lab 12/25/19 1227 12/25/19 1724 12/26/19 0554 12/27/19 0454 12/27/19 0943 12/28/19 0719 12/28/19 0841 12/29/19 0706  NA 125*   < > 136 138 139 136  --  139  K 2.1*   < > 3.1* 3.8 4.1 4.6  --  3.7  CL 74*   < > 97* 104 107 106  --  106  CO2 28   < > 28 25 23 23   --  26  GLUCOSE 124*   < > 69* 70 67* 97  --  86  BUN 10   < > 7 <5* <5* <5*  --  <5*  CREATININE 0.67   < >  0.52 0.54 0.54 0.46  --  0.56  CALCIUM 10.2   < > 8.1* 8.6* 8.5* 8.9  --  8.9  MG 1.4*  --  2.3  --  2.0  --  1.6* 2.0  AST  --   --  224* 229* 215* 132*  --  102*  ALT  --   --  59* 68* 71* 64*  --  64*  ALKPHOS  --   --  96 107 112 110  --  106  BILITOT  --   --  2.6* 1.9* 1.9* 1.9*  --  1.4*   < > = values in this interval not displayed.   ------------------------------------------------------------------------------------------------------------------ No results for input(s): CHOL, HDL, LDLCALC, TRIG, CHOLHDL, LDLDIRECT in the last 72 hours.  No results found for: HGBA1C ------------------------------------------------------------------------------------------------------------------ No results for input(s): TSH, T4TOTAL, T3FREE, THYROIDAB in the last 72 hours.  Invalid input(s): FREET3 ------------------------------------------------------------------------------------------------------------------ No results for input(s): VITAMINB12, FOLATE, FERRITIN, TIBC, IRON, RETICCTPCT in the last 72 hours.  Coagulation profile Recent Labs  Lab 12/25/19 1724  INR 0.9    No results for input(s): DDIMER in the last 72 hours.  Cardiac Enzymes No results for input(s): CKMB, TROPONINI, MYOGLOBIN in the last 168 hours.  Invalid  input(s): CK ------------------------------------------------------------------------------------------------------------------ No results found for: BNP   Lucky Cowboy M.D on 12/29/2019 at 6:34 PM  Go to [email or web address] - for contact info  [facility] - Office  [phone]

## 2019-12-30 LAB — GLUCOSE, CAPILLARY: Glucose-Capillary: 98 mg/dL (ref 70–99)

## 2019-12-30 MED ORDER — NICOTINE 14 MG/24HR TD PT24: 14.0000 mg | MEDICATED_PATCH | Freq: Every day | TRANSDERMAL | 3 refills | Status: DC

## 2019-12-30 MED ORDER — TRAZODONE HCL 100 MG PO TABS: 100.0000 mg | ORAL_TABLET | Freq: Every day | ORAL | 3 refills | Status: DC

## 2019-12-30 MED ORDER — ONDANSETRON HCL 4 MG PO TABS: 4.0000 mg | ORAL_TABLET | Freq: Four times a day (QID) | ORAL | 0 refills | Status: DC | PRN

## 2019-12-30 MED ORDER — PANTOPRAZOLE SODIUM 40 MG PO TBEC: 40.0000 mg | DELAYED_RELEASE_TABLET | Freq: Every day | ORAL | 3 refills | Status: DC

## 2019-12-30 MED ORDER — THIAMINE HCL 100 MG PO TABS: 100.0000 mg | ORAL_TABLET | Freq: Every day | ORAL | 3 refills | Status: DC

## 2019-12-30 MED ORDER — FOLIC ACID 1 MG PO TABS: 1.0000 mg | ORAL_TABLET | Freq: Every day | ORAL | 3 refills | Status: DC

## 2019-12-30 NOTE — Discharge Instructions (Signed)
1) complete abstinence from alcohol advised 2) please consider AA meetings and other outpatient or inpatient alcohol rehabilitation program 3) please take your medications as prescribed

## 2019-12-30 NOTE — Clinical Social Work Note (Signed)
TOC consulted for current substance abuse resources. Patient contacted and is not currently interested in substance abuse treatmenent nor resources. Advised to contact TOC if she reconsidered and desired SA rehabilitation and/or resources.    TOC signing off.     Martha Campbell, Clydene Pugh, LCSW

## 2019-12-30 NOTE — Discharge Summary (Signed)
Martha Campbell, is a 44 y.o. female  DOB [birth year]  MRN [id number].  Admission date:  12/25/2019  Admitting Physician  Paidyn Mcferran Denton Brick, MD  Discharge Date:  12/30/2019   Primary MD  Redmond School, MD  Recommendations for primary care physician for things to follow:   1) complete abstinence from alcohol advised 2) please consider AA meetings and other outpatient or inpatient alcohol rehabilitation program 3) please take your medications as prescribed  Admission Diagnosis  Seizure ([place]) [R56.9]   Discharge Diagnosis  Seizure ([place]) [R56.9]    Principal Problem:   Delirium tremens with Seizure Active Problems:   Seizure ([place])   ETOH abuse   Tobacco abuse      Past Medical History:  Diagnosis Date  . Anxiety   . Skin cancer     Past Surgical History:  Procedure Laterality Date  . Cyst removed from Sinuses    . SKIN FULL THICKNESS GRAFT Right 06/16/2018   Procedure: surgical prep scalp wound 16cm2, full thickness skin graft from right arm to scalp;  Surgeon: Irene Limbo, MD;  Location: [facility];  Service: Plastics;  Laterality: Right;       HPI  from the history and physical done on the day of admission:    Martha Campbell  is a 44 y.o. female with past medical history relevant for seasonal allergies, tobacco and alcohol abuse presents from PCPs office with tachycardia and subsequently had seizure episode while in the ED  --Patient drinks lots of scotch daily apparently was trying to cut back, she had a fall at home recently with rib cage injury, she went to PCP to get a work note so she can return to work because her rib cage injury was getting better -PCP noted tachycardia with heart rate in the high 130s so PCP referred patient to ED for further evaluation -While waiting on the stretcher in the ED in the hallway the [facility] staff noticed patient making rather odd  noises-- Patient was later found to be having seizures and then postictal -Responded well to lorazepam -CT head without acute findings -D-dimer 0.59, CTA chest without acute findings patient does have diffuse fatty change in the liver consistent with alcohol abuse  -Labs in ED reveal negative UDS, potassium 2.1 with a magnesium of 1.4 LFTs are pending -Troponin was not elevated -Sodium low at 125 with a chloride of 74, glucose 124 with anion gap over 20 -CBC with high MCH and thrombocytopenia platelet of  39K     Hospital Course:    Brief Summary:- 44 y.o.femalewith past medical history relevant for seasonal allergies, tobacco and alcohol abuse presents from PCPs office with tachycardia and subsequently had seizure episode in the ED consistent with alcohol withdrawal and was admitted 12/25/2019 for same -DT symptoms have resolved at this time  A/p 1)Severe delirium tremens/alcohol withdrawal with seizures---CT head unremarkable, -Tachycardia, anxiety, agitation and restlessness have now resolved -Patient was treated with IV lorazepam per [facility] protocol -Patient completed IV banana bag, continue  multivitamin  as ordered -Overall DTs have resolved   2)alcohol abuse--patient drinks scotch daily,apparently was drinking less lately,now having alcohol withdrawals with seizures--management as above #1  3)tobacco abuse---smoking cessation advised, nicotine patch is ordered  4)Severehyponatremia/hypokalemia/hypomagnesemia/hypophosphatemia in an alcoholic female- Resolved with IV oral/ replacement -No vomiting no diarrhea  5)Thrombocytopenia---platelet count improved to 99k , thrombocytopenia was most likely from a combination of direct toxic effect of alcohol on the bone marrow and possibly some degree of liver cirrhosis -No bleeding concerns,    6)Recurrent Hypoglycemia--- due to poor oral intake in a patient with alcohol withdrawal and seizures and sedation from post  ictal state and lorazepam -Hypoglycemia resolved The patient has adequate oral intake  7)Elevated LFTs Secondary to alcoholic liver disease  AST is down to 102 from a peak of 229, ALT is down to 64 from a peak of 71, T bili is down to 1.4 from a peak of 2.6 -Cessation of alcohol strongly advised  Disposition--- patient declines referral to alcohol/substance abuse programs, discharged home  Code Status : Full  Family Communication: updated Mother [name]-- at [phone]     Consults  :  na   Discharge Condition: stable  Follow UP--PCP as advised  Diet and Activity recommendation:  As advised  Discharge Instructions    Discharge Instructions    Call MD for:  difficulty breathing, headache or visual disturbances   Complete by: As directed    Call MD for:  persistant dizziness or light-headedness   Complete by: As directed    Call MD for:  persistant nausea and vomiting   Complete by: As directed    Call MD for:  severe uncontrolled pain   Complete by: As directed    Call MD for:  temperature >100.4   Complete by: As directed    Diet - low sodium heart healthy   Complete by: As directed    Diet - low sodium heart healthy   Complete by: As directed    Discharge instructions   Complete by: As directed    1) complete abstinence from alcohol advised 2) please consider AA meetings and other outpatient or inpatient alcohol rehabilitation program 3) please take your medications as prescribed   Increase activity slowly   Complete by: As directed    Increase activity slowly   Complete by: As directed         Discharge Medications     Allergies as of 12/30/2019      Reactions   Penicillins    Did it involve swelling of the face/tongue/throat, SOB, or low BP? Unknown Did it involve sudden or severe rash/hives, skin peeling, or any reaction on the inside of your mouth or nose? Unknown Did you need to seek medical attention at a hospital or doctor's office?  Unknown When did it last happen? If all above answers are "NO", may proceed with cephalosporin use.      Medication List    TAKE these medications   folic acid 1 MG tablet Commonly known as: FOLVITE Take 1 tablet (1 mg total) by mouth daily. Start taking on: December 31, 2019   nicotine 14 mg/24hr patch Commonly known as: NICODERM CQ - dosed in mg/24 hours Place 1 patch (14 mg total) onto the skin daily. Start taking on: December 31, 2019   ondansetron 4 MG tablet Commonly known as: ZOFRAN Take 1 tablet (4 mg total) by mouth every 6 (six) hours as needed for nausea.   pantoprazole 40 MG tablet Commonly known as:  PROTONIX Take 1 tablet (40 mg total) by mouth daily. Start taking on: December 31, 2019   thiamine 100 MG tablet Take 1 tablet (100 mg total) by mouth daily. Start taking on: December 31, 2019   traZODone 100 MG tablet Commonly known as: DESYREL Take 1 tablet (100 mg total) by mouth at bedtime.       Major procedures and Radiology Reports - PLEASE review detailed and final reports for all details, in brief -    DG Chest 2 View  Result Date: 12/25/2019 CLINICAL DATA:  Chest pain.  Recent fall EXAM: CHEST - 2 VIEW COMPARISON:  None. FINDINGS: Lungs are clear. Heart size and pulmonary vascularity are normal. No adenopathy. Small metallic foreign body in the anterior right chest wall. No bone lesions. No pneumothorax. IMPRESSION: No edema or consolidation. No pneumothorax. Cardiac silhouette within normal limits. Electronically Signed   By: Lowella Grip III M.D.   On: 12/25/2019 12:03   CT Head Wo Contrast  Result Date: 12/25/2019 CLINICAL DATA:  Seizure. Fall 2 weeks ago. EXAM: CT HEAD WITHOUT CONTRAST TECHNIQUE: Contiguous axial images were obtained from the base of the skull through the vertex without intravenous contrast. COMPARISON:  None. FINDINGS: Brain: There is residual intravascular contrast material from an earlier chest CTA. No acute intracranial  hemorrhage is identified within this mild limitation. No acute infarct, mass, midline shift, or extra-axial fluid collection is evident. The ventricles are normal in size. The cerebral and cerebellar sulci are slightly prominent for age likely reflecting mild parenchymal volume loss. Vascular: Calcified atherosclerosis at the skull base. Skull: No fracture or suspicious osseous lesion. Sinuses/Orbits: Visualized paranasal sinuses and mastoid air cells are clear. Visualized orbits are unremarkable. Other: Soft tissue defect in the midline frontal scalp. IMPRESSION: No evidence of acute intracranial abnormality. Electronically Signed   By: Logan Bores M.D.   On: 12/25/2019 16:41   CT Angio Chest PE W and/or Wo Contrast  Result Date: 12/25/2019 CLINICAL DATA:  PE suspected. Fall 2 weeks ago. Fell on right side. Persistent right-sided chest pain. EXAM: CT ANGIOGRAPHY CHEST WITH CONTRAST TECHNIQUE: Multidetector CT imaging of the chest was performed using the standard protocol during bolus administration of intravenous contrast. Multiplanar CT image reconstructions and MIPs were obtained to evaluate the vascular anatomy. CONTRAST:  164mL OMNIPAQUE IOHEXOL 350 MG/ML SOLN COMPARISON:  Two-view chest x-ray 12/25/2019 FINDINGS: Cardiovascular: The heart size is normal. The lung bases are clear. No significant pleural or pericardial effusion is present. Aorta and great vessel origins are within normal limits. Pulmonary artery opacification is excellent. No focal filling defects are present to suggest pulmonary embolus. Mediastinum/Nodes: No significant mediastinal, hilar, or axillary adenopathy is present. Metallic BB is located in the right breast. Lungs/Pleura: Lungs are clear without focal nodule, mass, or airspace disease. No significant pleural effusion or pneumothorax is present. Upper Abdomen: Diffuse fatty infiltration of the liver is noted. No focal lesions are present. Limited imaging of the upper abdomen is  otherwise unremarkable. Musculoskeletal: Superior endplate fractures at T3, T8, T9, and T12 are remote. Review of the MIP images confirms the above findings. IMPRESSION: 1. No evidence for pulmonary embolus. 2. No acute or focal lesion to explain the patient's symptoms. 3. Diffuse fatty infiltration of the liver. 4. Remote superior endplate fractures at T3, T8, T9, and T12. Electronically Signed   By: San Morelle M.D.   On: 12/25/2019 15:06    Micro Results    Recent Results (from the past 240 hour(s))  SARS  CORONAVIRUS 2 (TAT 6-24 HRS) Nasopharyngeal Nasopharyngeal Swab     Status: None   Collection Time: 12/25/19  1:38 PM   Specimen: Nasopharyngeal Swab  Result Value Ref Range Status   SARS Coronavirus 2 NEGATIVE NEGATIVE Final    Comment: (NOTE) SARS-CoV-2 target nucleic acids are NOT DETECTED. The SARS-CoV-2 RNA is generally detectable in upper and lower respiratory specimens during the acute phase of infection. Negative results do not preclude SARS-CoV-2 infection, do not rule out co-infections with other pathogens, and should not be used as the sole basis for treatment or other patient management decisions. Negative results must be combined with clinical observations, patient history, and epidemiological information. The expected result is Negative. Fact Sheet for Patients: SugarRoll.be Fact Sheet for Healthcare Providers: https://www.woods-mathews.com/ This test is not yet approved or cleared by the [place] FDA and  has been authorized for detection and/or diagnosis of SARS-CoV-2 by FDA under an Emergency Use Authorization (EUA). This EUA will remain  in effect (meaning this test can be used) for the duration of the COVID-19 declaration under Section 56 4(b)(1) of the Act, 21 U.S.C. section 360bbb-3(b)(1), unless the authorization is terminated or revoked sooner. Performed at [facility], [place] [address]  [place], [place] [address]        Today   Subjective    Martha Campbell today has no complaints, eating and drinking well -Ambulating the hallways with no dizziness palpitations or shortness of breath          Patient has been seen and examined prior to discharge -Patient is very coherent, appropriate and cooperative   Objective   Blood pressure 104/83, pulse (!) 102, temperature 97.8 F (36.6 C), temperature source Oral, resp. rate 17, height 5\' 5"  (1.651 m), weight 68 kg, last menstrual period 08/04/2013, SpO2 100 %.   Intake/Output Summary (Last 24 hours) at 12/30/2019 1241 Last data filed at 12/30/2019 0000 Gross per 24 hour  Intake 906.42 ml  Output 800 ml  Net 106.42 ml    Exam Gen:- Awake Alert, no acute distress  HEENT:- [place].AT, No sclera icterus Neck-Supple Neck,No JVD,.  Lungs-  CTAB , good air movement bilaterally  CV- S1, S2 normal, regular Abd-  +ve B.Sounds, Abd Soft, No tenderness,    Extremity/Skin:- No  edema,   good pulses Psych-affect is appropriate, oriented x3, -Patient is very coherent, appropriate and cooperative Neuro-no new focal deficits, no tremors    Data Review   CBC w Diff:  Lab Results  Component Value Date   WBC 4.2 12/29/2019   HGB 16.0 (H) 12/29/2019   HCT 46.5 (H) 12/29/2019   PLT 99 (L) 12/29/2019    CMP:  Lab Results  Component Value Date   NA 139 12/29/2019   K 3.7 12/29/2019   CL 106 12/29/2019   CO2 26 12/29/2019   BUN <5 (L) 12/29/2019   CREATININE 0.56 12/29/2019   PROT 6.2 (L) 12/29/2019   ALBUMIN 3.3 (L) 12/29/2019   BILITOT 1.4 (H) 12/29/2019   ALKPHOS 106 12/29/2019   AST 102 (H) 12/29/2019   ALT 64 (H) 12/29/2019  .   Total Discharge time is about 33 minutes  Roxan Hockey M.D on 12/30/2019 at 12:41 PM  Go to [email or web address] -  for contact info  [facility] - Office  [phone]

## 2020-05-28 ENCOUNTER — Other Ambulatory Visit: Payer: Self-pay

## 2020-05-28 ENCOUNTER — Emergency Department (HOSPITAL_COMMUNITY)
Admission: EM | Admit: 2020-05-28 | Discharge: 2020-05-29 | Disposition: A | Discharge status: Home / Self Care | Payer: Self-pay | Attending: Emergency Medicine | Admitting: Emergency Medicine

## 2020-05-28 ENCOUNTER — Encounter (HOSPITAL_COMMUNITY): Payer: Self-pay | Admitting: Emergency Medicine

## 2020-05-28 ENCOUNTER — Ambulatory Visit
Admission: EM | Admit: 2020-05-28 | Discharge: 2020-05-28 | Discharge status: Against Medical Advice | Payer: Commercial Managed Care - PPO

## 2020-05-28 DIAGNOSIS — F17210 Nicotine dependence, cigarettes, uncomplicated: Secondary | ICD-10-CM | POA: Insufficient documentation

## 2020-05-28 DIAGNOSIS — E876 Hypokalemia: Secondary | ICD-10-CM | POA: Insufficient documentation

## 2020-05-28 DIAGNOSIS — Z79899 Other long term (current) drug therapy: Secondary | ICD-10-CM | POA: Insufficient documentation

## 2020-05-28 DIAGNOSIS — R600 Localized edema: Secondary | ICD-10-CM | POA: Insufficient documentation

## 2020-05-28 DIAGNOSIS — R17 Unspecified jaundice: Secondary | ICD-10-CM | POA: Insufficient documentation

## 2020-05-28 DIAGNOSIS — Z85828 Personal history of other malignant neoplasm of skin: Secondary | ICD-10-CM | POA: Insufficient documentation

## 2020-05-28 DIAGNOSIS — K625 Hemorrhage of anus and rectum: Secondary | ICD-10-CM | POA: Insufficient documentation

## 2020-05-28 DIAGNOSIS — D649 Anemia, unspecified: Secondary | ICD-10-CM | POA: Insufficient documentation

## 2020-05-28 DIAGNOSIS — L03119 Cellulitis of unspecified part of limb: Secondary | ICD-10-CM | POA: Insufficient documentation

## 2020-05-28 LAB — BASIC METABOLIC PANEL
Anion gap: 11 (ref 5–15)
BUN: 8 mg/dL (ref 6–20)
CO2: 23 mmol/L (ref 22–32)
Calcium: 8.5 mg/dL — ABNORMAL LOW (ref 8.9–10.3)
Chloride: 96 mmol/L — ABNORMAL LOW (ref 98–111)
Creatinine, Ser: 0.56 mg/dL (ref 0.44–1.00)
GFR calc Af Amer: >60 mL/min (ref >60)
GFR calc non Af Amer: >60 mL/min (ref >60)
Glucose, Bld: 97 mg/dL (ref 70–99)
Potassium: 2.8 mmol/L — ABNORMAL LOW (ref 3.5–5.1)
Sodium: 130 mmol/L — ABNORMAL LOW (ref 135–145)

## 2020-05-28 LAB — CBC
HCT: 33.8 % — ABNORMAL LOW (ref 36.0–46.0)
Hemoglobin: 11 g/dL — ABNORMAL LOW (ref 12.0–15.0)
MCH: 35.8 pg — ABNORMAL HIGH (ref 26.0–34.0)
MCHC: 32.5 g/dL (ref 30.0–36.0)
MCV: 110.1 fL — ABNORMAL HIGH (ref 80.0–100.0)
Platelets: 470 10*3/uL — ABNORMAL HIGH (ref 150–400)
RBC: 3.07 MIL/uL — ABNORMAL LOW (ref 3.87–5.11)
RDW: 15.6 % — ABNORMAL HIGH (ref 11.5–15.5)
WBC: 11 10*3/uL — ABNORMAL HIGH (ref 4.0–10.5)
nRBC: 0 % (ref 0.0–0.2)

## 2020-05-28 NOTE — ED Triage Notes (Signed)
Patient has bilateral edema to feet and ankles since last Wednesday. Both feet and ankles are red and swollen. Patient denies any pain.

## 2020-05-28 NOTE — ED Notes (Signed)
Per Lestine Box, [place] patient sent to ER for evaluation of bilateral leg swelling.

## 2020-05-29 LAB — HEPATIC FUNCTION PANEL
ALT: 37 U/L (ref 0–44)
AST: 100 U/L — ABNORMAL HIGH (ref 15–41)
Albumin: 2.3 g/dL — ABNORMAL LOW (ref 3.5–5.0)
Alkaline Phosphatase: 193 U/L — ABNORMAL HIGH (ref 38–126)
Bilirubin, Direct: 3.1 mg/dL — ABNORMAL HIGH (ref 0.0–0.2)
Indirect Bilirubin: 2.7 mg/dL — ABNORMAL HIGH (ref 0.3–0.9)
Total Bilirubin: 5.8 mg/dL — ABNORMAL HIGH (ref 0.3–1.2)
Total Protein: 6.2 g/dL — ABNORMAL LOW (ref 6.5–8.1)

## 2020-05-29 LAB — APTT: aPTT: 27 seconds (ref 24–36)

## 2020-05-29 LAB — URINALYSIS, ROUTINE W REFLEX MICROSCOPIC
Bilirubin Urine: NEGATIVE
Glucose, UA: NEGATIVE mg/dL
Hgb urine dipstick: NEGATIVE
Ketones, ur: NEGATIVE mg/dL
Leukocytes,Ua: NEGATIVE
Nitrite: NEGATIVE
Protein, ur: NEGATIVE mg/dL
Specific Gravity, Urine: 1.003 — ABNORMAL LOW (ref 1.005–1.030)
pH: 5 (ref 5.0–8.0)

## 2020-05-29 LAB — PROTIME-INR
INR: 0.9 (ref 0.8–1.2)
Prothrombin Time: 12.2 seconds (ref 11.4–15.2)

## 2020-05-29 MED ORDER — FUROSEMIDE 10 MG/ML IJ SOLN
60.0000 mg | Freq: Once | INTRAMUSCULAR | Status: AC
  Administered 2020-05-29: 60 mg via INTRAVENOUS
  Filled 2020-05-29: qty 6

## 2020-05-29 MED ORDER — POTASSIUM CHLORIDE CRYS ER 20 MEQ PO TBCR
40.0000 meq | EXTENDED_RELEASE_TABLET | Freq: Once | ORAL | Status: AC
  Administered 2020-05-29: 40 meq via ORAL
  Filled 2020-05-29: qty 2

## 2020-05-29 MED ORDER — FUROSEMIDE 40 MG PO TABS
40.0000 mg | ORAL_TABLET | Freq: Every day | ORAL | 0 refills | Status: DC
Start: 2020-05-29 — End: 2021-10-24

## 2020-05-29 MED ORDER — DOXYCYCLINE HYCLATE 100 MG PO CAPS
100.0000 mg | ORAL_CAPSULE | Freq: Every day | ORAL | 0 refills | Status: DC
Start: 2020-05-29 — End: 2020-09-22

## 2020-05-29 MED ORDER — POTASSIUM CHLORIDE CRYS ER 20 MEQ PO TBCR
20.0000 meq | EXTENDED_RELEASE_TABLET | Freq: Two times a day (BID) | ORAL | 0 refills | Status: DC
Start: 2020-05-29 — End: 2021-10-24

## 2020-05-29 NOTE — Discharge Instructions (Addendum)
Take the medication as prescribed.  Please call Dr. Roseanne Kaufman office to follow-up about your liver and also the rectal bleeding.  Return to the emergency department if you have a lot of rectal bleeding such as blood in the toilet, abdominal pain, start feeling dizzy or lightheaded.

## 2020-05-29 NOTE — ED Provider Notes (Signed)
[facility] EMERGENCY DEPARTMENT Provider Note   CSN: [id number] Arrival date & time: 05/28/20  1952   Time seen 12:54 AM  History Chief Complaint  Patient presents with  . Leg Swelling    Martha Campbell is a 44 y.o. female.  HPI   Patient is here for husband who does most of the answering of questions.  He states he noted she started having swelling of her lower legs about a month ago but states it has gotten progressively worse and now she has swelling up into her thighs.  Patient only became of the wear of the swelling on June 5 and also at that point noted some redness of her feet.  She denies any fever or chills.  She has not had this happen before.  On further discussion she also has noted some abdominal swelling over the last couple days.  She denies chest pain or shortness of breath.  She denies being in the sun or having any new shoes.  Husband does states she was drinking 1/5 a day but he states she has been sober for 8 weeks.  He denies her being confused but states she might just be a little bit slower than her usual.  Patient lost her job in January due to missing a lot of days of work because her husband had Covid and she had to quarantine.  PCP Redmond School, MD   Past Medical History:  Diagnosis Date  . Anxiety   . Skin cancer     Patient Active Problem List   Diagnosis Date Noted  . Seizure ([place]) 12/25/2019  . ETOH abuse 12/25/2019  . Delirium tremens with Seizure 12/25/2019  . Tobacco abuse 12/25/2019    Past Surgical History:  Procedure Laterality Date  . Cyst removed from Sinuses    . SKIN FULL THICKNESS GRAFT Right 06/16/2018   Procedure: surgical prep scalp wound 16cm2, full thickness skin graft from right arm to scalp;  Surgeon: Irene Limbo, MD;  Location: [place];  Service: Plastics;  Laterality: Right;     OB History   No obstetric history on file.     Family History  Problem Relation Age of Onset  . Healthy Mother   .  Healthy Father     Social History   Tobacco Use  . Smoking status: Current Every Day Smoker    Packs/day: 1.00    Types: Cigarettes  . Smokeless tobacco: Never Used  . Tobacco comment: Trying to quit smoking  Substance Use Topics  . Alcohol use: Yes    Comment: Drinks scotch daily  . Drug use: Never  unemployed Lives with spouse  Home Medications Prior to Admission medications   Medication Sig Start Date End Date Taking? Authorizing Provider  doxycycline (VIBRAMYCIN) 100 MG capsule Take 1 capsule (100 mg total) by mouth daily. 05/29/20   Rolland Porter, MD  folic acid (FOLVITE) 1 MG tablet Take 1 tablet (1 mg total) by mouth daily. 12/31/19   Roxan Hockey, MD  furosemide (LASIX) 40 MG tablet Take 1 tablet (40 mg total) by mouth daily. 05/29/20   Rolland Porter, MD  nicotine (NICODERM CQ - DOSED IN MG/24 HOURS) 14 mg/24hr patch Place 1 patch (14 mg total) onto the skin daily. 12/31/19   Roxan Hockey, MD  ondansetron (ZOFRAN) 4 MG tablet Take 1 tablet (4 mg total) by mouth every 6 (six) hours as needed for nausea. 12/30/19   Roxan Hockey, MD  pantoprazole (PROTONIX) 40 MG tablet  Take 1 tablet (40 mg total) by mouth daily. 12/31/19   Roxan Hockey, MD  potassium chloride SA (KLOR-CON) 20 MEQ tablet Take 1 tablet (20 mEq total) by mouth 2 (two) times daily. 05/29/20   Rolland Porter, MD  thiamine 100 MG tablet Take 1 tablet (100 mg total) by mouth daily. 12/31/19   Roxan Hockey, MD  traZODone (DESYREL) 100 MG tablet Take 1 tablet (100 mg total) by mouth at bedtime. 12/30/19   Roxan Hockey, MD  Denies taking any medications  Allergies    Penicillins  Review of Systems   Review of Systems  All other systems reviewed and are negative.   Physical Exam Updated Vital Signs BP 124/77 (BP Location: Right Arm)   Pulse 94   Temp 98.4 F (36.9 C) (Oral)   Resp 19   Ht 5\' 5"  (1.651 m)   Wt 77.1 kg   LMP 08/04/2013   SpO2 100%   BMI 28.29 kg/m   Physical Exam Vitals and nursing  note reviewed.  Constitutional:      General: She is not in acute distress.    Appearance: Normal appearance. She is obese.  HENT:     Head: Normocephalic and atraumatic.     Right Ear: External ear normal.     Left Ear: External ear normal.     Nose: Nose normal.     Mouth/Throat:     Mouth: Mucous membranes are moist.     Pharynx: No oropharyngeal exudate or posterior oropharyngeal erythema.  Eyes:     General: Scleral icterus present.     Extraocular Movements: Extraocular movements intact.     Conjunctiva/sclera: Conjunctivae normal.     Pupils: Pupils are equal, round, and reactive to light.  Cardiovascular:     Rate and Rhythm: Regular rhythm. Tachycardia present.  Pulmonary:     Effort: Pulmonary effort is normal. No respiratory distress.     Breath sounds: Normal breath sounds.  Abdominal:     General: Bowel sounds are normal. There is distension.     Tenderness: There is no abdominal tenderness.  Musculoskeletal:        General: Swelling present. Normal range of motion.     Cervical back: Normal range of motion.     Comments: Patient is noted to have swelling of both lower extremities that extends up into the thigh area.  She has a lot of swelling of the dorsum of her feet with redness.  There are some clear vesicles/blisters, some are draining.  Skin:    General: Skin is warm and dry.     Comments: Patient's face appears yellow.  Neurological:     General: No focal deficit present.     Mental Status: She is alert and oriented to person, place, and time.     Cranial Nerves: No cranial nerve deficit.  Psychiatric:        Mood and Affect: Mood normal.        Behavior: Behavior normal.        Thought Content: Thought content normal.       ED Results / Procedures / Treatments   Labs (all labs ordered are listed, but only abnormal results are displayed) Results for orders placed or performed during the hospital encounter of 05/28/20  CBC  Result Value Ref Range     WBC 11.0 (H) 4.0 - 10.5 K/uL   RBC 3.07 (L) 3.87 - 5.11 MIL/uL   Hemoglobin 11.0 (L) 12.0 - 15.0 g/dL   HCT  33.8 (L) 36.0 - 46.0 %   MCV 110.1 (H) 80.0 - 100.0 fL   MCH 35.8 (H) 26.0 - 34.0 pg   MCHC 32.5 30.0 - 36.0 g/dL   RDW 15.6 (H) 11.5 - 15.5 %   Platelets 470 (H) 150 - 400 K/uL   nRBC 0.0 0.0 - 0.2 %  Basic metabolic panel  Result Value Ref Range   Sodium 130 (L) 135 - 145 mmol/L   Potassium 2.8 (L) 3.5 - 5.1 mmol/L   Chloride 96 (L) 98 - 111 mmol/L   CO2 23 22 - 32 mmol/L   Glucose, Bld 97 70 - 99 mg/dL   BUN 8 6 - 20 mg/dL   Creatinine, Ser 0.56 0.44 - 1.00 mg/dL   Calcium 8.5 (L) 8.9 - 10.3 mg/dL   GFR calc non Af Amer >60 >60 mL/min   GFR calc Af Amer >60 >60 mL/min   Anion gap 11 5 - 15  Hepatic function panel  Result Value Ref Range   Total Protein 6.2 (L) 6.5 - 8.1 g/dL   Albumin 2.3 (L) 3.5 - 5.0 g/dL   AST 100 (H) 15 - 41 U/L   ALT 37 0 - 44 U/L   Alkaline Phosphatase 193 (H) 38 - 126 U/L   Total Bilirubin 5.8 (H) 0.3 - 1.2 mg/dL   Bilirubin, Direct 3.1 (H) 0.0 - 0.2 mg/dL   Indirect Bilirubin 2.7 (H) 0.3 - 0.9 mg/dL  Protime-INR  Result Value Ref Range   Prothrombin Time 12.2 11.4 - 15.2 seconds   INR 0.9 0.8 - 1.2  APTT  Result Value Ref Range   aPTT 27 24 - 36 seconds  Urinalysis, Routine w reflex microscopic  Result Value Ref Range   Color, Urine STRAW (A) YELLOW   APPearance CLEAR CLEAR   Specific Gravity, Urine 1.003 (L) 1.005 - 1.030   pH 5.0 5.0 - 8.0   Glucose, UA NEGATIVE NEGATIVE mg/dL   Hgb urine dipstick NEGATIVE NEGATIVE   Bilirubin Urine NEGATIVE NEGATIVE   Ketones, ur NEGATIVE NEGATIVE mg/dL   Protein, ur NEGATIVE NEGATIVE mg/dL   Nitrite NEGATIVE NEGATIVE   Leukocytes,Ua NEGATIVE NEGATIVE   Laboratory interpretation all normal except leukocytosis, anemia, hyponatremia, hypokalemia, low chloride    EKG None  Radiology No results found.  Procedures .Critical Care Performed by: Rolland Porter, MD Authorized by: Rolland Porter, MD   Critical care provider statement:    Critical care time (minutes):  31   Critical care was necessary to treat or prevent imminent or life-threatening deterioration of the following conditions:  Hepatic failure   Critical care was time spent personally by me on the following activities:  Examination of patient, obtaining history from patient or surrogate, ordering and review of laboratory studies and re-evaluation of patient's condition   (including critical care time)  Medications Ordered in ED Medications  furosemide (LASIX) injection 60 mg (60 mg Intravenous Given 05/29/20 0135)  potassium chloride SA (KLOR-CON) CR tablet 40 mEq (40 mEq Oral Given 05/29/20 0142)    ED Course  I have reviewed the triage vital signs and the nursing notes.  Pertinent labs & imaging results that were available during my care of the patient were reviewed by me and considered in my medical decision making (see chart for details).    MDM Rules/Calculators/A&P                      I discussed with the patient and her spouse  that I suspect she was having liver disease most likely from her alcoholism.  I suspect she now is developing ascites.  Laboratory test was added to what the nursing staff had already ordered.  IV was inserted and she was given IV Lasix.  She was given oral potassium for her hypokalemia and because I was given her Lasix.  Recheck at 3:45 AM patient states she has had to urinate 3 times since she got the Lasix.  She feels like her legs are less tight.  We discussed her test results.  Patient does not want to be admitted due to lack of insurance.  When I initially talked to her I thought she would have to be admitted however her liver tests were not as abnormal as I was expecting.  When we discussed her anemia she states when she wipes she has been seeing blood the last week.  She denies seeing any blood in the toilet.  I am going to refer her to GI.  Unfortunately most antibiotics that  I would normally use for cellulitis such as Septra, doxycycline, clindamycin, Zithromax oral partially or fully metabolized by the liver.  Therefore I chose doxycycline and cut the dose back to just once a day.   Final Clinical Impression(s) / ED Diagnoses Final diagnoses:  Bilateral lower extremity edema  Cellulitis of lower extremity, unspecified laterality  Jaundice  Rectal bleeding  Anemia, unspecified type  Hypokalemia    Rx / DC Orders ED Discharge Orders         Ordered    potassium chloride SA (KLOR-CON) 20 MEQ tablet  2 times daily     05/29/20 0434    furosemide (LASIX) 40 MG tablet  Daily     05/29/20 0434    doxycycline (VIBRAMYCIN) 100 MG capsule  Daily     05/29/20 0434         Plan discharge  Rolland Porter, MD, Barbette Or, MD 05/29/20 [phone]

## 2020-09-22 ENCOUNTER — Other Ambulatory Visit: Payer: Self-pay

## 2020-09-22 ENCOUNTER — Inpatient Hospital Stay (HOSPITAL_COMMUNITY)
Admission: EM | Admit: 2020-09-22 | Discharge: 2020-09-25 | DRG: 894 | Discharge status: Against Medical Advice | Payer: Self-pay | Attending: Family Medicine | Admitting: Family Medicine

## 2020-09-22 ENCOUNTER — Encounter (HOSPITAL_COMMUNITY): Payer: Self-pay | Admitting: Emergency Medicine

## 2020-09-22 DIAGNOSIS — Z85828 Personal history of other malignant neoplasm of skin: Secondary | ICD-10-CM

## 2020-09-22 DIAGNOSIS — F17210 Nicotine dependence, cigarettes, uncomplicated: Secondary | ICD-10-CM | POA: Diagnosis present

## 2020-09-22 DIAGNOSIS — Z5329 Procedure and treatment not carried out because of patient's decision for other reasons: Secondary | ICD-10-CM | POA: Diagnosis present

## 2020-09-22 DIAGNOSIS — F10939 Alcohol use, unspecified with withdrawal, unspecified: Secondary | ICD-10-CM | POA: Diagnosis present

## 2020-09-22 DIAGNOSIS — F10239 Alcohol dependence with withdrawal, unspecified: Principal | ICD-10-CM | POA: Diagnosis present

## 2020-09-22 DIAGNOSIS — F419 Anxiety disorder, unspecified: Secondary | ICD-10-CM | POA: Diagnosis present

## 2020-09-22 DIAGNOSIS — G40901 Epilepsy, unspecified, not intractable, with status epilepticus: Secondary | ICD-10-CM | POA: Diagnosis present

## 2020-09-22 DIAGNOSIS — K7010 Alcoholic hepatitis without ascites: Secondary | ICD-10-CM | POA: Diagnosis present

## 2020-09-22 DIAGNOSIS — R400 Somnolence: Secondary | ICD-10-CM | POA: Diagnosis not present

## 2020-09-22 DIAGNOSIS — Z79899 Other long term (current) drug therapy: Secondary | ICD-10-CM

## 2020-09-22 DIAGNOSIS — Z880 Allergy status to penicillin: Secondary | ICD-10-CM

## 2020-09-22 DIAGNOSIS — E876 Hypokalemia: Secondary | ICD-10-CM | POA: Diagnosis present

## 2020-09-22 DIAGNOSIS — Y900 Blood alcohol level of less than 20 mg/100 ml: Secondary | ICD-10-CM | POA: Diagnosis present

## 2020-09-22 DIAGNOSIS — F10230 Alcohol dependence with withdrawal, uncomplicated: Secondary | ICD-10-CM

## 2020-09-22 DIAGNOSIS — F1010 Alcohol abuse, uncomplicated: Secondary | ICD-10-CM

## 2020-09-22 DIAGNOSIS — D696 Thrombocytopenia, unspecified: Secondary | ICD-10-CM | POA: Diagnosis present

## 2020-09-22 DIAGNOSIS — Z20822 Contact with and (suspected) exposure to covid-19: Secondary | ICD-10-CM | POA: Diagnosis present

## 2020-09-22 DIAGNOSIS — R569 Unspecified convulsions: Secondary | ICD-10-CM

## 2020-09-22 LAB — CBC WITH DIFFERENTIAL/PLATELET
Abs Immature Granulocytes: 0.02 10*3/uL (ref 0.00–0.07)
Abs Immature Granulocytes: 0.03 10*3/uL (ref 0.00–0.07)
Basophils Absolute: 0 10*3/uL (ref 0.0–0.1)
Basophils Absolute: 0 10*3/uL (ref 0.0–0.1)
Basophils Relative: 1 %
Basophils Relative: 1 %
Eosinophils Absolute: 0 10*3/uL (ref 0.0–0.5)
Eosinophils Absolute: 0 10*3/uL (ref 0.0–0.5)
Eosinophils Relative: 0 %
Eosinophils Relative: 0 %
HCT: 35.5 % — ABNORMAL LOW (ref 36.0–46.0)
HCT: 34.9 % — ABNORMAL LOW (ref 36.0–46.0)
Hemoglobin: 13 g/dL (ref 12.0–15.0)
Hemoglobin: 12.5 g/dL (ref 12.0–15.0)
Immature Granulocytes: 0 %
Immature Granulocytes: 0 %
Lymphocytes Relative: 20 %
Lymphocytes Relative: 40 %
Lymphs Abs: 1.2 10*3/uL (ref 0.7–4.0)
Lymphs Abs: 2.8 10*3/uL (ref 0.7–4.0)
MCH: 35.2 pg — ABNORMAL HIGH (ref 26.0–34.0)
MCH: 34.9 pg — ABNORMAL HIGH (ref 26.0–34.0)
MCHC: 36.6 g/dL — ABNORMAL HIGH (ref 30.0–36.0)
MCHC: 35.8 g/dL (ref 30.0–36.0)
MCV: 96.2 fL (ref 80.0–100.0)
MCV: 97.5 fL (ref 80.0–100.0)
Monocytes Absolute: 0.2 10*3/uL (ref 0.1–1.0)
Monocytes Absolute: 0.2 10*3/uL (ref 0.1–1.0)
Monocytes Relative: 3 %
Monocytes Relative: 3 %
Neutro Abs: 4.4 10*3/uL (ref 1.7–7.7)
Neutro Abs: 3.8 10*3/uL (ref 1.7–7.7)
Neutrophils Relative %: 76 %
Neutrophils Relative %: 56 %
Platelets: 44 10*3/uL — ABNORMAL LOW (ref 150–400)
Platelets: 39 10*3/uL — ABNORMAL LOW (ref 150–400)
RBC: 3.69 MIL/uL — ABNORMAL LOW (ref 3.87–5.11)
RBC: 3.58 MIL/uL — ABNORMAL LOW (ref 3.87–5.11)
RDW: 19.6 % — ABNORMAL HIGH (ref 11.5–15.5)
RDW: 19.7 % — ABNORMAL HIGH (ref 11.5–15.5)
WBC: 5.8 10*3/uL (ref 4.0–10.5)
WBC: 6.9 10*3/uL (ref 4.0–10.5)
nRBC: 0 % (ref 0.0–0.2)
nRBC: 0.3 % — ABNORMAL HIGH (ref 0.0–0.2)

## 2020-09-22 LAB — COMPREHENSIVE METABOLIC PANEL
ALT: 60 U/L — ABNORMAL HIGH (ref 0–44)
AST: 207 U/L — ABNORMAL HIGH (ref 15–41)
Albumin: 3.3 g/dL — ABNORMAL LOW (ref 3.5–5.0)
Alkaline Phosphatase: 173 U/L — ABNORMAL HIGH (ref 38–126)
Anion gap: 14 (ref 5–15)
BUN: 5 mg/dL — ABNORMAL LOW (ref 6–20)
CO2: 24 mmol/L (ref 22–32)
Calcium: 8.5 mg/dL — ABNORMAL LOW (ref 8.9–10.3)
Chloride: 93 mmol/L — ABNORMAL LOW (ref 98–111)
Creatinine, Ser: 0.48 mg/dL (ref 0.44–1.00)
GFR calc Af Amer: >60 mL/min (ref >60)
GFR calc non Af Amer: >60 mL/min (ref >60)
Glucose, Bld: 115 mg/dL — ABNORMAL HIGH (ref 70–99)
Potassium: 2.5 mmol/L — CL (ref 3.5–5.1)
Sodium: 131 mmol/L — ABNORMAL LOW (ref 135–145)
Total Bilirubin: 3.4 mg/dL — ABNORMAL HIGH (ref 0.3–1.2)
Total Protein: 6.7 g/dL (ref 6.5–8.1)

## 2020-09-22 LAB — HEPATITIS PANEL, ACUTE
HCV Ab: NONREACTIVE
Hep A IgM: NONREACTIVE
Hep B C IgM: NONREACTIVE
Hepatitis B Surface Ag: NONREACTIVE

## 2020-09-22 LAB — COMPREHENSIVE METABOLIC PANEL WITH GFR
ALT: 55 U/L — ABNORMAL HIGH (ref 0–44)
AST: 182 U/L — ABNORMAL HIGH (ref 15–41)
Albumin: 3.1 g/dL — ABNORMAL LOW (ref 3.5–5.0)
Alkaline Phosphatase: 159 U/L — ABNORMAL HIGH (ref 38–126)
Anion gap: 12 (ref 5–15)
BUN: <5 mg/dL — ABNORMAL LOW (ref 6–20)
CO2: 27 mmol/L (ref 22–32)
Calcium: 8.4 mg/dL — ABNORMAL LOW (ref 8.9–10.3)
Chloride: 97 mmol/L — ABNORMAL LOW (ref 98–111)
Creatinine, Ser: 0.49 mg/dL (ref 0.44–1.00)
GFR calc Af Amer: >60 mL/min
GFR calc non Af Amer: >60 mL/min
Glucose, Bld: 81 mg/dL (ref 70–99)
Potassium: 3 mmol/L — ABNORMAL LOW (ref 3.5–5.1)
Sodium: 136 mmol/L (ref 135–145)
Total Bilirubin: 2.7 mg/dL — ABNORMAL HIGH (ref 0.3–1.2)
Total Protein: 6.1 g/dL — ABNORMAL LOW (ref 6.5–8.1)

## 2020-09-22 LAB — RESPIRATORY PANEL BY RT PCR (FLU A&B, COVID)
Influenza A by PCR: NEGATIVE
Influenza B by PCR: NEGATIVE
SARS Coronavirus 2 by RT PCR: NEGATIVE

## 2020-09-22 LAB — MAGNESIUM
Magnesium: 1.2 mg/dL — ABNORMAL LOW (ref 1.7–2.4)
Magnesium: 2 mg/dL (ref 1.7–2.4)

## 2020-09-22 LAB — ETHANOL: Alcohol, Ethyl (B): <10 mg/dL (ref <10)

## 2020-09-22 MED ORDER — LORAZEPAM 1 MG PO TABS
0.0000 mg | ORAL_TABLET | Freq: Four times a day (QID) | ORAL | Status: AC
  Administered 2020-09-22: 2 mg via ORAL
  Administered 2020-09-23: 4 mg via ORAL
  Filled 2020-09-22: qty 4
  Filled 2020-09-22: qty 2

## 2020-09-22 MED ORDER — ONDANSETRON HCL 4 MG/2ML IJ SOLN: 4.0000 mg | Freq: Four times a day (QID) | INTRAMUSCULAR | Status: DC | PRN

## 2020-09-22 MED ORDER — ONDANSETRON HCL 4 MG/2ML IJ SOLN
4.0000 mg | Freq: Once | INTRAMUSCULAR | Status: AC
  Administered 2020-09-22: 4 mg via INTRAVENOUS
  Filled 2020-09-22: qty 2

## 2020-09-22 MED ORDER — LORAZEPAM 2 MG/ML IJ SOLN: 2.0000 mg | INTRAMUSCULAR | Status: DC | PRN

## 2020-09-22 MED ORDER — POLYETHYLENE GLYCOL 3350 17 G PO PACK: 17.0000 g | PACK | Freq: Every day | ORAL | Status: DC | PRN

## 2020-09-22 MED ORDER — THIAMINE HCL 100 MG PO TABS
100.0000 mg | ORAL_TABLET | Freq: Every day | ORAL | Status: DC
  Administered 2020-09-22 – 2020-09-25 (×4): 100 mg via ORAL
  Filled 2020-09-22 (×4): qty 1

## 2020-09-22 MED ORDER — METOPROLOL TARTRATE 5 MG/5ML IV SOLN
5.0000 mg | Freq: Once | INTRAVENOUS | Status: AC
  Administered 2020-09-22: 5 mg via INTRAVENOUS
  Filled 2020-09-22: qty 5

## 2020-09-22 MED ORDER — SODIUM CHLORIDE 0.9 % IV BOLUS
500.0000 mL | Freq: Once | INTRAVENOUS | Status: AC
  Administered 2020-09-22: 500 mL via INTRAVENOUS

## 2020-09-22 MED ORDER — LEVETIRACETAM IN NACL 500 MG/100ML IV SOLN
500.0000 mg | Freq: Two times a day (BID) | INTRAVENOUS | Status: DC
  Administered 2020-09-22 – 2020-09-24 (×5): 500 mg via INTRAVENOUS
  Filled 2020-09-22 (×5): qty 100

## 2020-09-22 MED ORDER — TRAZODONE HCL 50 MG PO TABS
100.0000 mg | ORAL_TABLET | Freq: Every day | ORAL | Status: DC
  Administered 2020-09-22 – 2020-09-24 (×3): 100 mg via ORAL
  Filled 2020-09-22 (×3): qty 2

## 2020-09-22 MED ORDER — LORAZEPAM 1 MG PO TABS
0.0000 mg | ORAL_TABLET | Freq: Two times a day (BID) | ORAL | Status: DC
  Administered 2020-09-24: 2 mg via ORAL
  Filled 2020-09-22: qty 2

## 2020-09-22 MED ORDER — PANTOPRAZOLE SODIUM 40 MG PO TBEC
40.0000 mg | DELAYED_RELEASE_TABLET | Freq: Every day | ORAL | Status: DC
  Administered 2020-09-22 – 2020-09-25 (×4): 40 mg via ORAL
  Filled 2020-09-22 (×4): qty 1

## 2020-09-22 MED ORDER — LORAZEPAM 2 MG/ML IJ SOLN
1.0000 mg | Freq: Once | INTRAMUSCULAR | Status: AC
  Administered 2020-09-22: 1 mg via INTRAVENOUS
  Filled 2020-09-22: qty 1

## 2020-09-22 MED ORDER — THIAMINE HCL 100 MG/ML IJ SOLN
100.0000 mg | Freq: Every day | INTRAMUSCULAR | Status: DC
  Filled 2020-09-22 (×2): qty 2

## 2020-09-22 MED ORDER — POTASSIUM CHLORIDE 10 MEQ/100ML IV SOLN
10.0000 meq | INTRAVENOUS | Status: AC
  Administered 2020-09-22 (×4): 10 meq via INTRAVENOUS
  Filled 2020-09-22 (×4): qty 100

## 2020-09-22 MED ORDER — OXYCODONE HCL 5 MG PO TABS
5.0000 mg | ORAL_TABLET | ORAL | Status: DC | PRN
  Administered 2020-09-24: 5 mg via ORAL
  Filled 2020-09-22: qty 1

## 2020-09-22 MED ORDER — ACETAMINOPHEN 650 MG RE SUPP: 650.0000 mg | Freq: Four times a day (QID) | RECTAL | Status: DC | PRN

## 2020-09-22 MED ORDER — ONDANSETRON HCL 4 MG PO TABS: 4.0000 mg | ORAL_TABLET | Freq: Four times a day (QID) | ORAL | Status: DC | PRN

## 2020-09-22 MED ORDER — NICOTINE 14 MG/24HR TD PT24
14.0000 mg | MEDICATED_PATCH | Freq: Every day | TRANSDERMAL | Status: DC
  Administered 2020-09-22 – 2020-09-25 (×4): 14 mg via TRANSDERMAL
  Filled 2020-09-22 (×4): qty 1

## 2020-09-22 MED ORDER — FOLIC ACID 1 MG PO TABS
1.0000 mg | ORAL_TABLET | Freq: Every day | ORAL | Status: DC
  Administered 2020-09-22 – 2020-09-25 (×4): 1 mg via ORAL
  Filled 2020-09-22 (×4): qty 1

## 2020-09-22 MED ORDER — POTASSIUM CHLORIDE CRYS ER 20 MEQ PO TBCR
40.0000 meq | EXTENDED_RELEASE_TABLET | Freq: Once | ORAL | Status: AC
  Administered 2020-09-22: 40 meq via ORAL
  Filled 2020-09-22: qty 2

## 2020-09-22 MED ORDER — LORAZEPAM 2 MG/ML IJ SOLN: 0.0000 mg | Freq: Two times a day (BID) | INTRAMUSCULAR | Status: DC

## 2020-09-22 MED ORDER — ACETAMINOPHEN 325 MG PO TABS: 650.0000 mg | ORAL_TABLET | Freq: Four times a day (QID) | ORAL | Status: DC | PRN

## 2020-09-22 MED ORDER — MAGNESIUM SULFATE 2 GM/50ML IV SOLN
2.0000 g | Freq: Once | INTRAVENOUS | Status: AC
  Administered 2020-09-22: 2 g via INTRAVENOUS
  Filled 2020-09-22: qty 50

## 2020-09-22 MED ORDER — LORAZEPAM 2 MG/ML IJ SOLN
0.0000 mg | Freq: Four times a day (QID) | INTRAMUSCULAR | Status: AC
  Administered 2020-09-23: 1 mg via INTRAVENOUS
  Administered 2020-09-23: 2 mg via INTRAVENOUS
  Filled 2020-09-22 (×2): qty 1

## 2020-09-22 MED ORDER — SODIUM CHLORIDE 0.9 % IV BOLUS
1000.0000 mL | Freq: Once | INTRAVENOUS | Status: AC
  Administered 2020-09-22: 1000 mL via INTRAVENOUS

## 2020-09-22 NOTE — ED Provider Notes (Signed)
[facility] EMERGENCY DEPARTMENT Provider Note   CSN: [id number] Arrival date & time: 09/22/20  0113   Time seen 1:30 AM  History Chief Complaint  Patient presents with  . Seizures    Martha Campbell is a 44 y.o. female.  HPI   Patient states she quit drinking 2 weeks ago.  She was drinking a half a gallon every 4 days.  She states she was feeling okay tonight but she has had a lot of nausea but no vomiting.  Due to the nausea she has been eating less.  She has been having some shakes but denies feeling that things are crawling on her or she is seeing things moving on the walls.  Patient was admitted to the hospital in January when she had alcohol withdrawal seizures.  EMS reports her mother called when her mother witnessed the patient having seizure activity for about 10 minutes.  EMS states she was postictal on their arrival.  Patient denies any injury tonight from her seizure.  Patient denies taking any medications.  PCP Redmond School, MD   Past Medical History:  Diagnosis Date  . Anxiety   . Skin cancer     Patient Active Problem List   Diagnosis Date Noted  . Seizure ([place]) 12/25/2019  . ETOH abuse 12/25/2019  . Delirium tremens with Seizure 12/25/2019  . Tobacco abuse 12/25/2019    Past Surgical History:  Procedure Laterality Date  . Cyst removed from Sinuses    . SKIN FULL THICKNESS GRAFT Right 06/16/2018   Procedure: surgical prep scalp wound 16cm2, full thickness skin graft from right arm to scalp;  Surgeon: Irene Limbo, MD;  Location: [place];  Service: Plastics;  Laterality: Right;     OB History   No obstetric history on file.     Family History  Problem Relation Age of Onset  . Healthy Mother   . Healthy Father     Social History   Tobacco Use  . Smoking status: Current Every Day Smoker    Packs/day: 1.00    Types: Cigarettes  . Smokeless tobacco: Never Used  . Tobacco comment: Trying to quit smoking  Vaping Use  .  Vaping Use: Never used  Substance Use Topics  . Alcohol use: Yes    Comment: Drinks scotch daily  . Drug use: Never  unemployed was working in [employer] Medications Prior to Admission medications   Medication Sig Start Date End Date Taking? Authorizing Provider  doxycycline (VIBRAMYCIN) 100 MG capsule Take 1 capsule (100 mg total) by mouth daily. 05/29/20   Rolland Porter, MD  folic acid (FOLVITE) 1 MG tablet Take 1 tablet (1 mg total) by mouth daily. 12/31/19   Roxan Hockey, MD  furosemide (LASIX) 40 MG tablet Take 1 tablet (40 mg total) by mouth daily. 05/29/20   Rolland Porter, MD  nicotine (NICODERM CQ - DOSED IN MG/24 HOURS) 14 mg/24hr patch Place 1 patch (14 mg total) onto the skin daily. 12/31/19   Roxan Hockey, MD  ondansetron (ZOFRAN) 4 MG tablet Take 1 tablet (4 mg total) by mouth every 6 (six) hours as needed for nausea. 12/30/19   Roxan Hockey, MD  pantoprazole (PROTONIX) 40 MG tablet Take 1 tablet (40 mg total) by mouth daily. 12/31/19   Roxan Hockey, MD  potassium chloride SA (KLOR-CON) 20 MEQ tablet Take 1 tablet (20 mEq total) by mouth 2 (two) times daily. 05/29/20   Rolland Porter, MD  thiamine 100 MG tablet  Take 1 tablet (100 mg total) by mouth daily. 12/31/19   Roxan Hockey, MD  traZODone (DESYREL) 100 MG tablet Take 1 tablet (100 mg total) by mouth at bedtime. 12/30/19   Roxan Hockey, MD    Allergies    Penicillins  Review of Systems   Review of Systems  All other systems reviewed and are negative.   Physical Exam Updated Vital Signs BP (!) 131/94   Pulse (!) 105   Temp 99.3 F (37.4 C) (Oral)   Resp 20   Ht 5\' 9"  (1.753 m)   Wt 63.5 kg   LMP 08/04/2013   SpO2 95%   BMI 20.67 kg/m   Physical Exam Vitals and nursing note reviewed.  Constitutional:      Appearance: Normal appearance. She is normal weight. She is not ill-appearing or toxic-appearing.  HENT:     Head: Normocephalic and atraumatic.     Right Ear: External ear normal.     Left  Ear: External ear normal.     Nose: Nose normal.     Mouth/Throat:     Mouth: Mucous membranes are dry.     Comments: Tongue and lips are very red, she has a mild tremor of her tongue, no trauma to her tongue was seen Eyes:     Extraocular Movements: Extraocular movements intact.     Conjunctiva/sclera: Conjunctivae normal.     Pupils: Pupils are equal, round, and reactive to light.  Cardiovascular:     Rate and Rhythm: Regular rhythm. Tachycardia present.  Extrasystoles are present.    Pulses: Normal pulses.     Heart sounds: No murmur heard.   Pulmonary:     Effort: Pulmonary effort is normal. No respiratory distress.     Breath sounds: Normal breath sounds.  Abdominal:     General: Abdomen is flat. Bowel sounds are normal.     Palpations: Abdomen is soft.  Musculoskeletal:        General: No swelling.     Cervical back: Normal range of motion.     Right lower leg: No edema.     Left lower leg: No edema.  Skin:    General: Skin is warm and dry.     Comments: Patient has some palmar erythema  Neurological:     General: No focal deficit present.     Mental Status: She is alert and oriented to person, place, and time. Mental status is at baseline.  Psychiatric:        Mood and Affect: Affect is flat.        Speech: Speech is delayed.        Behavior: Behavior is slowed.     ED Results / Procedures / Treatments   Labs (all labs ordered are listed, but only abnormal results are displayed) Results for orders placed or performed during the hospital encounter of 09/22/20  Comprehensive metabolic panel  Result Value Ref Range   Sodium 131 (L) 135 - 145 mmol/L   Potassium 2.5 (LL) 3.5 - 5.1 mmol/L   Chloride 93 (L) 98 - 111 mmol/L   CO2 24 22 - 32 mmol/L   Glucose, Bld 115 (H) 70 - 99 mg/dL   BUN 5 (L) 6 - 20 mg/dL   Creatinine, Ser 0.48 0.44 - 1.00 mg/dL   Calcium 8.5 (L) 8.9 - 10.3 mg/dL   Total Protein 6.7 6.5 - 8.1 g/dL   Albumin 3.3 (L) 3.5 - 5.0 g/dL   AST 207 (H)  15 -  41 U/L   ALT 60 (H) 0 - 44 U/L   Alkaline Phosphatase 173 (H) 38 - 126 U/L   Total Bilirubin 3.4 (H) 0.3 - 1.2 mg/dL   GFR calc non Af Amer >60 >60 mL/min   GFR calc Af Amer >60 >60 mL/min   Anion gap 14 5 - 15  Ethanol  Result Value Ref Range   Alcohol, Ethyl (B) <10 <10 mg/dL  CBC with Differential  Result Value Ref Range   WBC 5.8 4.0 - 10.5 K/uL   RBC 3.69 (L) 3.87 - 5.11 MIL/uL   Hemoglobin 13.0 12.0 - 15.0 g/dL   HCT 35.5 (L) 36 - 46 %   MCV 96.2 80.0 - 100.0 fL   MCH 35.2 (H) 26.0 - 34.0 pg   MCHC 36.6 (H) 30.0 - 36.0 g/dL   RDW 19.6 (H) 11.5 - 15.5 %   Platelets 44 (L) 150 - 400 K/uL   nRBC 0.0 0.0 - 0.2 %   Neutrophils Relative % 76 %   Neutro Abs 4.4 1.7 - 7.7 K/uL   Lymphocytes Relative 20 %   Lymphs Abs 1.2 0.7 - 4.0 K/uL   Monocytes Relative 3 %   Monocytes Absolute 0.2 0 - 1 K/uL   Eosinophils Relative 0 %   Eosinophils Absolute 0.0 0 - 0 K/uL   Basophils Relative 1 %   Basophils Absolute 0.0 0 - 0 K/uL   Immature Granulocytes 0 %   Abs Immature Granulocytes 0.02 0.00 - 0.07 K/uL  Magnesium  Result Value Ref Range   Magnesium 1.2 (L) 1.7 - 2.4 mg/dL   Laboratory interpretation all normal except hypokalemia, hypomagnesemia, elevation of LFTs consistent with alcohol abuse, new thrombocytopenia, mild hyponatremia, mild malnutrition ,   EKG EKG Interpretation  Date/Time:  Sunday September 22 2020 01:31:36 EDT Ventricular Rate:  125 PR Interval:    QRS Duration: 84 QT Interval:  367 QTC Calculation: 530 R Axis:   87 Text Interpretation: Sinus tachycardia Multiform ventricular premature complexes Nonspecific repol abnormality, diffuse leads Prolonged QT interval No significant change since last tracing 25 Dec 2019 Confirmed by Rolland Porter [phone]) on 09/22/2020 1:43:34 AM   Radiology No results found.  Procedures Procedures (including critical care time)  Medications Ordered in ED Medications  LORazepam (ATIVAN) injection 0-4 mg (0 mg Intravenous  Not Given 09/22/20 0205)    Or  LORazepam (ATIVAN) tablet 0-4 mg ( Oral See Alternative 09/22/20 0205)  LORazepam (ATIVAN) injection 0-4 mg (has no administration in time range)    Or  LORazepam (ATIVAN) tablet 0-4 mg (has no administration in time range)  thiamine tablet 100 mg (has no administration in time range)    Or  thiamine (B-1) injection 100 mg (has no administration in time range)  levETIRAcetam (KEPPRA) IVPB 500 mg/100 mL premix (has no administration in time range)  sodium chloride 0.9 % bolus 500 mL (0 mLs Intravenous Stopped 09/22/20 0337)  sodium chloride 0.9 % bolus 1,000 mL (0 mLs Intravenous Stopped 09/22/20 0338)  ondansetron (ZOFRAN) injection 4 mg (4 mg Intravenous Given 09/22/20 0203)  potassium chloride SA (KLOR-CON) CR tablet 40 mEq (40 mEq Oral Given 09/22/20 0238)  magnesium sulfate IVPB 2 g 50 mL (0 g Intravenous Stopped 09/22/20 5462)    ED Course  I have reviewed the triage vital signs and the nursing notes.  Pertinent labs & imaging results that were available during my care of the patient were reviewed by me and considered in my medical decision  making (see chart for details).    MDM Rules/Calculators/A&P                          Patient was given IV fluid boluses and Zofran for her complaints of nausea and not eating well for the last couple days.  She was started on CIWA protocol.  She has a history of alcohol withdrawal seizures in January 2021.  She states she quit drinking about 2 weeks ago.  Patient's CIWA score was 1  Review of patient's labs show she has a low potassium and magnesium.  She is able to tolerate oral fluids and was given potassium 40 mEq orally.  Her magnesium was supplemented with 2 g of magnesium IV.  03:42AM Sarah, Teleneurology nurse manager, will have neurologist evaluate  3:56 AM teleneurologist has evaluated patient.  He feels like she should be admitted for observation because her seizure witnessed by her mother was about 10  minutes which is technically a status epilepticus.  He feels she needs a EEG, MRI and would start her on Keppra.  4:31 AM Dr Clearence Ped, hospitalist will admit  Patient has not had any seizure activity while in the ED.  Final Clinical Impression(s) / ED Diagnoses Final diagnoses:  Seizure ([place])  Hypokalemia  Hypomagnesemia  Alcohol abuse    Rx / DC Orders  Plan admission  Rolland Porter, MD, Barbette Or, MD 09/22/20 [phone]

## 2020-09-22 NOTE — H&P (Signed)
TRH H&P    Patient Demographics:    Martha Campbell, is a 44 y.o. female  MRN: [id number]  DOB - 1976/09/25  Admit Date - 09/22/2020  Referring MD/NP/PA: Tomi Bamberger  Outpatient Primary MD for the patient is Redmond School, MD  Patient coming from: Home  Chief complaint- Seizure   HPI:    Martha Campbell  is a 44 y.o. female, with history of skin cancer anxiety as well as alcohol abuse presents to the ED with a chief complaint of seizure.  Patient reports the last thing she remembers is lying down, 10 minutes later she remembers waking up.  She reports she had no incontinence, no bite on her lip or tongue.  She says that her mother told her she was having a seizure and she better get into the hospital.  Patient does report that she recently quit drinking.  2 weeks ago she had her last drink, and at that time it was 1/2 gallon of liquor.  Patient reports that she normally would drink a half a gallon a day.  She reports that leading up to today's event she has not had any change in her normal state of health.  She does report that she has had worsening vision over the past week, but she has not seen an eye doctor for a new prescription on her eyeglasses in the last 2 years.  Patient reports that she smokes half a pack per day.  She denies illicit drugs, and she has not had a Covid vaccine nor does she plan to.  In the ED Temperature 99.3, heart rate 105, respiratory rate 20, blood pressure 131/94, satting at 95% on room air White blood cell count 5.8, hemoglobin 13.0, platelets 44 CHEM panel reveals hyponatremia at 131, hypokalemia 2.5, hypomagnesemia at 1.2, alk phos elevated at 173, AST elevated at 207 and ALT 60 Alcohol level less than 10 EKG showing heart rate 125 QTC 530 sinus tach CIWA protocol started 2 g magnesium sulfate given Zofran given 40 mEq p.o. potassium, 1.5 L NaCl Thiamine Neurology was consulted and said if  the seizure-like activity lasted 10 minutes and she probably should be admitted for MRI, EEG, Keppra    Review of systems:    In addition to the HPI above,  No Fever-chills, No Headache, No changes with Vision or hearing, No problems swallowing food or Liquids, No Chest pain, Cough or Shortness of Breath, No Abdominal pain, No Nausea or Vomiting, bowel movements are regular, No Blood in stool or Urine, No dysuria, No new skin rashes or bruises, No new joints pains-aches,  No new weakness, tingling, numbness in any extremity, No recent weight gain or loss, No polyuria, polydypsia or polyphagia, No significant Mental Stressors.  All other systems reviewed and are negative.    Past History of the following :    Past Medical History:  Diagnosis Date  . Anxiety   . Skin cancer       Past Surgical History:  Procedure Laterality Date  . Cyst removed from Sinuses    .  SKIN FULL THICKNESS GRAFT Right 06/16/2018   Procedure: surgical prep scalp wound 16cm2, full thickness skin graft from right arm to scalp;  Surgeon: Irene Limbo, MD;  Location: [facility];  Service: Plastics;  Laterality: Right;      Social History:      Social History   Tobacco Use  . Smoking status: Current Every Day Smoker    Packs/day: 1.00    Types: Cigarettes  . Smokeless tobacco: Never Used  . Tobacco comment: Trying to quit smoking  Substance Use Topics  . Alcohol use: Yes    Comment: Drinks scotch daily       Family History :     Family History  Problem Relation Age of Onset  . Healthy Mother   . Healthy Father       Home Medications:   Prior to Admission medications   Medication Sig Start Date End Date Taking? Authorizing Provider  doxycycline (VIBRAMYCIN) 100 MG capsule Take 1 capsule (100 mg total) by mouth daily. 05/29/20   Rolland Porter, MD  folic acid (FOLVITE) 1 MG tablet Take 1 tablet (1 mg total) by mouth daily. 12/31/19   Roxan Hockey, MD  furosemide  (LASIX) 40 MG tablet Take 1 tablet (40 mg total) by mouth daily. 05/29/20   Rolland Porter, MD  nicotine (NICODERM CQ - DOSED IN MG/24 HOURS) 14 mg/24hr patch Place 1 patch (14 mg total) onto the skin daily. 12/31/19   Roxan Hockey, MD  ondansetron (ZOFRAN) 4 MG tablet Take 1 tablet (4 mg total) by mouth every 6 (six) hours as needed for nausea. 12/30/19   Roxan Hockey, MD  pantoprazole (PROTONIX) 40 MG tablet Take 1 tablet (40 mg total) by mouth daily. 12/31/19   Roxan Hockey, MD  potassium chloride SA (KLOR-CON) 20 MEQ tablet Take 1 tablet (20 mEq total) by mouth 2 (two) times daily. 05/29/20   Rolland Porter, MD  thiamine 100 MG tablet Take 1 tablet (100 mg total) by mouth daily. 12/31/19   Roxan Hockey, MD  traZODone (DESYREL) 100 MG tablet Take 1 tablet (100 mg total) by mouth at bedtime. 12/30/19   Roxan Hockey, MD     Allergies:     Allergies  Allergen Reactions  . Penicillins     Did it involve swelling of the face/tongue/throat, SOB, or low BP? Unknown Did it involve sudden or severe rash/hives, skin peeling, or any reaction on the inside of your mouth or nose? Unknown Did you need to seek medical attention at a hospital or doctor's office? Unknown When did it last happen? If all above answers are "NO", may proceed with cephalosporin use.      Physical Exam:   Vitals  Blood pressure (!) 131/94, pulse (!) 105, temperature 99.3 F (37.4 C), temperature source Oral, resp. rate 20, height $RemoveBe'5\' 9"'JsVbBlMjp$  (1.753 m), weight 63.5 kg, last menstrual period 08/04/2013, SpO2 95 %.  1.  General: Patient lying supine in bed in no acute distress  2. Psychiatric: Mood and behavior normal for situation  3. Neurologic: Cranial nerves II through XII are intact, no asterixis, moves all 4 extremities voluntarily, no focal deficit  4. HEENMT:  Head is atraumatic, normocephalic, pupils reactive to light, neck is slightly cachectic, trachea is midline, mucous membranes are moist  5.  Respiratory : Lungs are clear to auscultation bilaterally  6. Cardiovascular : Heart rate is tachycardic, regular rhythm, systolic murmur  7. Gastrointestinal:  Abdomen is soft, nondistended, nontender to palpation  8. Skin:  No acute lesions on limited skin exam  9.Musculoskeletal:  No acute deformity or peripheral edema    Data Review:    CBC Recent Labs  Lab 09/22/20 0140  WBC 5.8  HGB 13.0  HCT 35.5*  PLT 44*  MCV 96.2  MCH 35.2*  MCHC 36.6*  RDW 19.6*  LYMPHSABS 1.2  MONOABS 0.2  EOSABS 0.0  BASOSABS 0.0   ------------------------------------------------------------------------------------------------------------------  Results for orders placed or performed during the hospital encounter of 09/22/20 (from the past 48 hour(s))  Comprehensive metabolic panel     Status: Abnormal   Collection Time: 09/22/20  1:40 AM  Result Value Ref Range   Sodium 131 (L) 135 - 145 mmol/L   Potassium 2.5 (LL) 3.5 - 5.1 mmol/L    Comment: CRITICAL RESULT CALLED TO, READ BACK BY AND VERIFIED WITH: SAPPELT,J @ 0221 ON 09/22/20 BY JUW    Chloride 93 (L) 98 - 111 mmol/L   CO2 24 22 - 32 mmol/L   Glucose, Bld 115 (H) 70 - 99 mg/dL    Comment: Glucose reference range applies only to samples taken after fasting for at least 8 hours.   BUN 5 (L) 6 - 20 mg/dL   Creatinine, Ser 0.48 0.44 - 1.00 mg/dL   Calcium 8.5 (L) 8.9 - 10.3 mg/dL   Total Protein 6.7 6.5 - 8.1 g/dL   Albumin 3.3 (L) 3.5 - 5.0 g/dL   AST 207 (H) 15 - 41 U/L   ALT 60 (H) 0 - 44 U/L   Alkaline Phosphatase 173 (H) 38 - 126 U/L   Total Bilirubin 3.4 (H) 0.3 - 1.2 mg/dL   GFR calc non Af Amer >60 >60 mL/min   GFR calc Af Amer >60 >60 mL/min   Anion gap 14 5 - 15    Comment: Performed at [facility], [address], [place], [place] [address]  Ethanol     Status: None   Collection Time: 09/22/20  1:40 AM  Result Value Ref Range   Alcohol, Ethyl (B) <10 <10 mg/dL    Comment: (NOTE) Lowest detectable limit for  serum alcohol is 10 mg/dL.  For medical purposes only. Performed at [facility], [address] [place], [place] [address]   CBC with Differential     Status: Abnormal   Collection Time: 09/22/20  1:40 AM  Result Value Ref Range   WBC 5.8 4.0 - 10.5 K/uL   RBC 3.69 (L) 3.87 - 5.11 MIL/uL   Hemoglobin 13.0 12.0 - 15.0 g/dL   HCT 35.5 (L) 36 - 46 %   MCV 96.2 80.0 - 100.0 fL   MCH 35.2 (H) 26.0 - 34.0 pg   MCHC 36.6 (H) 30.0 - 36.0 g/dL   RDW 19.6 (H) 11.5 - 15.5 %   Platelets 44 (L) 150 - 400 K/uL    Comment: SPECIMEN CHECKED FOR CLOTS   nRBC 0.0 0.0 - 0.2 %   Neutrophils Relative % 76 %   Neutro Abs 4.4 1.7 - 7.7 K/uL   Lymphocytes Relative 20 %   Lymphs Abs 1.2 0.7 - 4.0 K/uL   Monocytes Relative 3 %   Monocytes Absolute 0.2 0 - 1 K/uL   Eosinophils Relative 0 %   Eosinophils Absolute 0.0 0 - 0 K/uL   Basophils Relative 1 %   Basophils Absolute 0.0 0 - 0 K/uL   Immature Granulocytes 0 %   Abs Immature Granulocytes 0.02 0.00 - 0.07 K/uL    Comment: Performed at [facility]  [address] [place], [place] [address]  Magnesium     Status: Abnormal   Collection Time: 09/22/20  1:40 AM  Result Value Ref Range   Magnesium 1.2 (L) 1.7 - 2.4 mg/dL    Comment: Performed at [facility], [address], [place], [place] [address]    Chemistries  Recent Labs  Lab 09/22/20 0140  NA 131*  K 2.5*  CL 93*  CO2 24  GLUCOSE 115*  BUN 5*  CREATININE 0.48  CALCIUM 8.5*  MG 1.2*  AST 207*  ALT 60*  ALKPHOS 173*  BILITOT 3.4*   ------------------------------------------------------------------------------------------------------------------  ------------------------------------------------------------------------------------------------------------------ GFR: Estimated Creatinine Clearance: 88.1 mL/min (by C-G formula based on SCr of 0.48 mg/dL). Liver Function Tests: Recent Labs  Lab 09/22/20 0140  AST 207*  ALT 60*  ALKPHOS 173*  BILITOT 3.4*  PROT 6.7  ALBUMIN  3.3*   No results for input(s): LIPASE, AMYLASE in the last 168 hours. No results for input(s): AMMONIA in the last 168 hours. Coagulation Profile: No results for input(s): INR, PROTIME in the last 168 hours. Cardiac Enzymes: No results for input(s): CKTOTAL, CKMB, CKMBINDEX, TROPONINI in the last 168 hours. BNP (last 3 results) No results for input(s): PROBNP in the last 8760 hours. HbA1C: No results for input(s): HGBA1C in the last 72 hours. CBG: No results for input(s): GLUCAP in the last 168 hours. Lipid Profile: No results for input(s): CHOL, HDL, LDLCALC, TRIG, CHOLHDL, LDLDIRECT in the last 72 hours. Thyroid Function Tests: No results for input(s): TSH, T4TOTAL, FREET4, T3FREE, THYROIDAB in the last 72 hours. Anemia Panel: No results for input(s): VITAMINB12, FOLATE, FERRITIN, TIBC, IRON, RETICCTPCT in the last 72 hours.  --------------------------------------------------------------------------------------------------------------- Urine analysis:    Component Value Date/Time   COLORURINE STRAW (A) 05/29/2020 0142   APPEARANCEUR CLEAR 05/29/2020 0142   LABSPEC 1.003 (L) 05/29/2020 0142   PHURINE 5.0 05/29/2020 0142   GLUCOSEU NEGATIVE 05/29/2020 0142   HGBUR NEGATIVE 05/29/2020 0142   BILIRUBINUR NEGATIVE 05/29/2020 0142   KETONESUR NEGATIVE 05/29/2020 0142   PROTEINUR NEGATIVE 05/29/2020 0142   NITRITE NEGATIVE 05/29/2020 0142   LEUKOCYTESUR NEGATIVE 05/29/2020 0142      Imaging Results:    No results found.  My personal review of EKG: Rhythm sinus tach, Rate 125 /min, QTc 530 ,no Acute ST changes   Assessment & Plan:    Active Problems:   Seizure ([place])   1. Seizure 1. Alcohol withdrawals on the differential, patient reports that her last drink was 2 weeks ago and that puts her slightly out of the window-she does not really seem to have a good concept of time 2. Electrolytes being replaced and will be rechecked 3. Reportedly lasting 10 minutes 4. Neuro  was consulted and advised admission, for MRI, EEG, and Keppra 5. Admit to [facility] as we don't have EEG or MRI on weekends 2. Hypokalemia/Hypomagnesemia 1. K+ 2.5, Magnesium 1.2 2. Replace and recheck 3. Prolonged QTc 1. 2/2 to electrolyte imbalance? 2. Monitor on tele 3. Avoid QT prolonging agents when possible 4. Transaminitis 1. In the setting of EtOH abuse 2. Trend 3. Avoid hepatotoxic agents  5. Thrombocytopenia 1. In the setting of EtOH abuse 2. Continue to monitor 3. Avoid blood thinning medications 6. EtOH abuse 1. CIWA protocol 7. Tachycardia 1. 2/2 to EtOH withdrawals? 2. Monitor on tele 3. Other vitals are stable   DVT Prophylaxis-  Heparin - SCDs   AM Labs Ordered, also please review Full Orders  Family Communication: No family at  bedside Code Status:  Full  Admission status: Observation  Time spent in minutes : [place]

## 2020-09-22 NOTE — ED Notes (Signed)
ED Provider at bedside. 

## 2020-09-22 NOTE — Progress Notes (Signed)
Subjective: Patient admitted this morning, see detailed H&P by Dr Josph Macho- [name] 44 year old female with history of anxiety, alcohol abuse presents with complaints of seizures.  Patient member lying down 1010 minutes later she remembers waking up.  Patient says that her mother told her that she was having a seizure and had to go to the hospital.  Patient recently quit drinking 2 weeks ago her last drink at that time was half gallon of liquor.  Patient started on CIWA protocol, neurology was consulted and recommended MRI, EEG and Keppra.  Patient was supposed to be transferred to [facility] for MRI brain and EEG.  Vitals:   09/22/20 1000 09/22/20 1255  BP:  (!) 131/92  Pulse: 93 100  Resp: 14   Temp:    SpO2: 95%       A/P Seizure-questionable alcohol withdrawal seizures-patient is refusing to go to [facility] and wants to stay at [facility].  Will cancel transfer to [facility], will obtain MRI brain, EEG in a.m. Will obtain formal neurology consultation in a.m.  Continue with Keppra, seizure precautions.  Patient has already been started on CIWA protocol.  Hypokalemia-potassium was 3.0, will replace potassium and follow BMP in am.  Transaminitis-likely alcoholic hepatitis.  AST elevated more than ALT.follow LFTs in a.m.    [facility] Hospitalist [phone]

## 2020-09-22 NOTE — ED Triage Notes (Signed)
Patient brought in by EMS for seizures. Patient's mother witnessed patient have seizure activity for 10 minutes. Patient has not been eating well over the last week. No past hx of seizures. CBG was 128. EMS reports patient post-ictal upon their arrival.

## 2020-09-22 NOTE — Consult Note (Signed)
[facility] Tele[facility]urology Consult Services  Stat Consult  Date of Service:   09/22/2020 03:37:42  Impression:     .  R56.9 - Seizures     .  G41.9 - Status epilepticus, unspecified  Comments/Sign-Out: This is a 44 yo woman who presents for evaluation of a prolonged seizure last night. Unfortunately, the witness is not present. On exam, she is alert, oriented, no focal deficits. I suppose it's possible that this could be an alcohol withdrawal seizure however this seems to be quite far removed from her last drink, if indeed her last drink really was two weeks ago. A ten minute seizure would quality as status epilepticus. I think due to the severity of this seizure, she should be admitted for further workup. Obtain brain MRI w/wo contrast and EEG. Place on keppra 750mg  BID.  CT HEAD: Not Reviewed head CT not done  Metrics: [facility] Notification Time: 09/22/2020 03:35:55 Stamp Time: 09/22/2020 03:37:42 Callback Response Time: 09/22/2020 03:39:50  Our recommendations are outlined below.  Imaging Studies:     .  MRI Head with and Without Contrast  Therapies:     .  Physical Therapy, Occupational Therapy, Speech Therapy Assessment When Applicable  Disposition: Neurology Follow Up Recommended  Sign Out:     .  Discussed with Emergency Department Provider  ----------------------------------------------------------------------------------------------------  Chief Complaint: I had a seizure  History of Present Illness: Patient is a 44 year old Female.  This is a 44 yo woman with history of etoh abuse who presents for witnessed seizure. Unfortunately, the witness (her mother) is not present right now. Per report, the seizure lasted about ten minutes. The patient is still a little somnolent. She denies history of prior seizures. She has not had any alcohol in approximately two weeks.           Examination: BP(133/100), Pulse(102), Blood  Glucose(115)  Neuro Exam:  General: Alert,Awake, Oriented to Time, Place, Person  Speech: Fluent:  Language: Intact:  Face: Symmetric:  Facial Sensation: Intact:  Visual Fields: Intact:  Extraocular Movements: Intact:  Motor Exam: No Drift:  Sensation: Intact:  Coordination: Intact:      Patient/Family was informed the Neurology Consult would occur via TeleHealth consult by way of interactive audio and video telecommunications and consented to receiving care in this manner.  Patient is being evaluated for possible acute neurologic impairment and high probability of imminent or life-threatening deterioration. I spent total of 15 minutes providing care to this patient, including time for face to face visit via telemedicine, review of medical records, imaging studies and discussion of findings with providers, the patient and/or family.   Dr Precious Gilding   [facility] [phone]  Case [id number]

## 2020-09-22 NOTE — ED Notes (Signed)
Hospitalist at bedside 

## 2020-09-22 NOTE — ED Notes (Addendum)
ED TO INPATIENT HANDOFF REPORT  ED Nurse Name and Phone #: [phone]  S Name/Age/Gender Martha Campbell 44 y.o. female Room/Bed: APA19/APA19  Code Status   Code Status: Full Code  Home/SNF/Other Home Patient oriented to: self, place, time and situation Is this baseline? Yes   Triage Complete: Triage complete  Chief Complaint Seizure [facility]) [R56.9] Alcohol withdrawal ([place]) [F10.239]  Triage Note Patient brought in by EMS for seizures. Patient's mother witnessed patient have seizure activity for 10 minutes. Patient has not been eating well over the last week. No past hx of seizures. CBG was 128. EMS reports patient post-ictal upon their arrival.    Allergies Allergies  Allergen Reactions  . Penicillins Nausea And Vomiting and Swelling    Stomach upset    Level of Care/Admitting Diagnosis ED Disposition    ED Disposition Condition [facility] Area: [facility] [[id number]]  Level of Care: Telemetry [5]  Covid Evaluation: Confirmed COVID Negative  Diagnosis: Alcohol withdrawal ([place]) [291.81.ICD-9-CM]  Admitting Physician: Loree Fee  Attending Physician: Loree Fee       B Medical/Surgery History Past Medical History:  Diagnosis Date  . Anxiety   . Skin cancer    Past Surgical History:  Procedure Laterality Date  . Cyst removed from Sinuses    . SKIN FULL THICKNESS GRAFT Right 06/16/2018   Procedure: surgical prep scalp wound 16cm2, full thickness skin graft from right arm to scalp;  Surgeon: Irene Limbo, MD;  Location: [place];  Service: Plastics;  Laterality: Right;     A IV Location/Drains/Wounds Patient Lines/Drains/Airways Status    Active Line/Drains/Airways    Name Placement date Placement time Site Days   Peripheral IV 09/22/20 Left Wrist 09/22/20  0122  Wrist  less than 1          Intake/Output Last 24 hours  Intake/Output Summary (Last 24 hours) at 09/22/2020 1408 Last data filed  at 09/22/2020 1128 Gross per 24 hour  Intake 2050 ml  Output --  Net 2050 ml    Labs/Imaging Results for orders placed or performed during the hospital encounter of 09/22/20 (from the past 48 hour(s))  Comprehensive metabolic panel     Status: Abnormal   Collection Time: 09/22/20  1:40 AM  Result Value Ref Range   Sodium 131 (L) 135 - 145 mmol/L   Potassium 2.5 (LL) 3.5 - 5.1 mmol/L    Comment: CRITICAL RESULT CALLED TO, READ BACK BY AND VERIFIED WITH: SAPPELT,J @ 0221 ON 09/22/20 BY JUW    Chloride 93 (L) 98 - 111 mmol/L   CO2 24 22 - 32 mmol/L   Glucose, Bld 115 (H) 70 - 99 mg/dL    Comment: Glucose reference range applies only to samples taken after fasting for at least 8 hours.   BUN 5 (L) 6 - 20 mg/dL   Creatinine, Ser 0.48 0.44 - 1.00 mg/dL   Calcium 8.5 (L) 8.9 - 10.3 mg/dL   Total Protein 6.7 6.5 - 8.1 g/dL   Albumin 3.3 (L) 3.5 - 5.0 g/dL   AST 207 (H) 15 - 41 U/L   ALT 60 (H) 0 - 44 U/L   Alkaline Phosphatase 173 (H) 38 - 126 U/L   Total Bilirubin 3.4 (H) 0.3 - 1.2 mg/dL   GFR calc non Af Amer >60 >60 mL/min   GFR calc Af Amer >60 >60 mL/min   Anion gap 14 5 - 15    Comment:  Performed at [facility], [address], [place], [place] [address]  Ethanol     Status: None   Collection Time: 09/22/20  1:40 AM  Result Value Ref Range   Alcohol, Ethyl (B) <10 <10 mg/dL    Comment: (NOTE) Lowest detectable limit for serum alcohol is 10 mg/dL.  For medical purposes only. Performed at [facility], [address] [place], [place] [address]   CBC with Differential     Status: Abnormal   Collection Time: 09/22/20  1:40 AM  Result Value Ref Range   WBC 5.8 4.0 - 10.5 K/uL   RBC 3.69 (L) 3.87 - 5.11 MIL/uL   Hemoglobin 13.0 12.0 - 15.0 g/dL   HCT 35.5 (L) 36 - 46 %   MCV 96.2 80.0 - 100.0 fL   MCH 35.2 (H) 26.0 - 34.0 pg   MCHC 36.6 (H) 30.0 - 36.0 g/dL   RDW 19.6 (H) 11.5 - 15.5 %   Platelets 44 (L) 150 - 400 K/uL    Comment: SPECIMEN CHECKED FOR CLOTS   nRBC 0.0  0.0 - 0.2 %   Neutrophils Relative % 76 %   Neutro Abs 4.4 1.7 - 7.7 K/uL   Lymphocytes Relative 20 %   Lymphs Abs 1.2 0.7 - 4.0 K/uL   Monocytes Relative 3 %   Monocytes Absolute 0.2 0 - 1 K/uL   Eosinophils Relative 0 %   Eosinophils Absolute 0.0 0 - 0 K/uL   Basophils Relative 1 %   Basophils Absolute 0.0 0 - 0 K/uL   Immature Granulocytes 0 %   Abs Immature Granulocytes 0.02 0.00 - 0.07 K/uL    Comment: Performed at [facility], [address], [place], [place] [address]  Magnesium     Status: Abnormal   Collection Time: 09/22/20  1:40 AM  Result Value Ref Range   Magnesium 1.2 (L) 1.7 - 2.4 mg/dL    Comment: Performed at [facility], [address], [place], [place] [address]  Hepatitis panel, acute     Status: None   Collection Time: 09/22/20  1:40 AM  Result Value Ref Range   Hepatitis B Surface Ag NON REACTIVE NON REACTIVE   HCV Ab NON REACTIVE NON REACTIVE    Comment: (NOTE) Nonreactive HCV antibody screen is consistent with no HCV infections,  unless recent infection is suspected or other evidence exists to indicate HCV infection.     Hep A IgM NON REACTIVE NON REACTIVE   Hep B C IgM NON REACTIVE NON REACTIVE    Comment: Performed at [facility], [place] [address] [place], [place] [address]  Respiratory Panel by RT PCR (Flu A&B, Covid) - Nasopharyngeal Swab     Status: None   Collection Time: 09/22/20  4:18 AM   Specimen: Nasopharyngeal Swab  Result Value Ref Range   SARS Coronavirus 2 by RT PCR NEGATIVE NEGATIVE    Comment: (NOTE) SARS-CoV-2 target nucleic acids are NOT DETECTED.  The SARS-CoV-2 RNA is generally detectable in upper respiratoy specimens during the acute phase of infection. The lowest concentration of SARS-CoV-2 viral copies this assay can detect is 131 copies/mL. A negative result does not preclude SARS-Cov-2 infection and should not be used as the sole basis for treatment or other patient management decisions. A negative result may occur  with  improper specimen collection/handling, submission of specimen other than nasopharyngeal swab, presence of viral mutation(s) within the areas targeted by this assay, and inadequate number of viral copies (<131 copies/mL). A  negative result must be combined with clinical observations, patient history, and epidemiological information. The expected result is Negative.  Fact Sheet for Patients:  PinkCheek.be  Fact Sheet for Healthcare Providers:  GravelBags.it  This test is no t yet approved or cleared by the [place] FDA and  has been authorized for detection and/or diagnosis of SARS-CoV-2 by FDA under an Emergency Use Authorization (EUA). This EUA will remain  in effect (meaning this test can be used) for the duration of the COVID-19 declaration under Section 564(b)(1) of the Act, 21 U.S.C. section 360bbb-3(b)(1), unless the authorization is terminated or revoked sooner.     Influenza A by PCR NEGATIVE NEGATIVE   Influenza B by PCR NEGATIVE NEGATIVE    Comment: (NOTE) The Xpert Xpress SARS-CoV-2/FLU/RSV assay is intended as an aid in  the diagnosis of influenza from Nasopharyngeal swab specimens and  should not be used as a sole basis for treatment. Nasal washings and  aspirates are unacceptable for Xpert Xpress SARS-CoV-2/FLU/RSV  testing.  Fact Sheet for Patients: PinkCheek.be  Fact Sheet for Healthcare Providers: GravelBags.it  This test is not yet approved or cleared by the [place] FDA and  has been authorized for detection and/or diagnosis of SARS-CoV-2 by  FDA under an Emergency Use Authorization (EUA). This EUA will remain  in effect (meaning this test can be used) for the duration of the  Covid-19 declaration under Section 564(b)(1) of the Act, 21  U.S.C. section 360bbb-3(b)(1), unless the authorization is  terminated or  revoked. Performed at [facility], [address], [place], [place] [address]   Comprehensive metabolic panel     Status: Abnormal   Collection Time: 09/22/20  6:45 AM  Result Value Ref Range   Sodium 136 135 - 145 mmol/L   Potassium 3.0 (L) 3.5 - 5.1 mmol/L   Chloride 97 (L) 98 - 111 mmol/L   CO2 27 22 - 32 mmol/L   Glucose, Bld 81 70 - 99 mg/dL    Comment: Glucose reference range applies only to samples taken after fasting for at least 8 hours.   BUN <5 (L) 6 - 20 mg/dL   Creatinine, Ser 0.49 0.44 - 1.00 mg/dL   Calcium 8.4 (L) 8.9 - 10.3 mg/dL   Total Protein 6.1 (L) 6.5 - 8.1 g/dL   Albumin 3.1 (L) 3.5 - 5.0 g/dL   AST 182 (H) 15 - 41 U/L   ALT 55 (H) 0 - 44 U/L   Alkaline Phosphatase 159 (H) 38 - 126 U/L   Total Bilirubin 2.7 (H) 0.3 - 1.2 mg/dL   GFR calc non Af Amer >60 >60 mL/min   GFR calc Af Amer >60 >60 mL/min   Anion gap 12 5 - 15    Comment: Performed at [facility], [address] [place], [place] [address]  Magnesium     Status: None   Collection Time: 09/22/20  6:45 AM  Result Value Ref Range   Magnesium 2.0 1.7 - 2.4 mg/dL    Comment: Performed at [facility], [address] [place], [place] [address]  CBC WITH DIFFERENTIAL     Status: Abnormal   Collection Time: 09/22/20  6:45 AM  Result Value Ref Range   WBC 6.9 4.0 - 10.5 K/uL   RBC 3.58 (L) 3.87 - 5.11 MIL/uL   Hemoglobin 12.5 12.0 - 15.0 g/dL   HCT 34.9 (L) 36 - 46 %   MCV 97.5 80.0 - 100.0 fL   MCH 34.9 (H) 26.0 -  34.0 pg   MCHC 35.8 30.0 - 36.0 g/dL   RDW 19.7 (H) 11.5 - 15.5 %   Platelets 39 (L) 150 - 400 K/uL    Comment: REPEATED TO VERIFY PLATELET COUNT CONFIRMED BY SMEAR SPECIMEN CHECKED FOR CLOTS Immature Platelet Fraction may be clinically indicated, consider ordering this additional test [id number]    nRBC 0.3 (H) 0.0 - 0.2 %   Neutrophils Relative % 56 %   Neutro Abs 3.8 1.7 - 7.7 K/uL   Lymphocytes Relative 40 %   Lymphs Abs 2.8 0.7 - 4.0 K/uL   Monocytes Relative 3 %    Monocytes Absolute 0.2 0 - 1 K/uL   Eosinophils Relative 0 %   Eosinophils Absolute 0.0 0 - 0 K/uL   Basophils Relative 1 %   Basophils Absolute 0.0 0 - 0 K/uL   Immature Granulocytes 0 %   Abs Immature Granulocytes 0.03 0.00 - 0.07 K/uL    Comment: Performed at [facility], [address], [place], [place] [address]   No results found.  Pending Labs Unresulted Labs (From admission, onward)          Start     Ordered   09/23/20 0500  Comprehensive metabolic panel  Tomorrow morning,   R        09/22/20 1344          Vitals/Pain Today's Vitals   09/22/20 0357 09/22/20 0407 09/22/20 1000 09/22/20 1255  BP: (!) 131/94   (!) 131/92  Pulse: (!) 105  93 100  Resp: 20  14   Temp:      TempSrc:      SpO2: 95%  95%   Weight:      Height:      PainSc:  Asleep      Isolation Precautions No active isolations  Medications Medications  LORazepam (ATIVAN) injection 0-4 mg (0 mg Intravenous Not Given 09/22/20 1255)    Or  LORazepam (ATIVAN) tablet 0-4 mg ( Oral See Alternative 09/22/20 1255)  LORazepam (ATIVAN) injection 0-4 mg (has no administration in time range)    Or  LORazepam (ATIVAN) tablet 0-4 mg (has no administration in time range)  thiamine tablet 100 mg (100 mg Oral Given 09/22/20 1026)    Or  thiamine (B-1) injection 100 mg ( Intravenous See Alternative 09/22/20 1026)  levETIRAcetam (KEPPRA) IVPB 500 mg/100 mL premix (0 mg Intravenous Stopped 09/22/20 0453)  nicotine (NICODERM CQ - dosed in mg/24 hours) patch 14 mg (14 mg Transdermal Patch Applied 09/22/20 1027)  traZODone (DESYREL) tablet 100 mg (has no administration in time range)  pantoprazole (PROTONIX) EC tablet 40 mg (40 mg Oral Given 85/8/85 0277)  folic acid (FOLVITE) tablet 1 mg (1 mg Oral Given 09/22/20 1026)  LORazepam (ATIVAN) injection 2 mg (has no administration in time range)  acetaminophen (TYLENOL) tablet 650 mg (has no administration in time range)    Or  acetaminophen (TYLENOL) suppository 650 mg  (has no administration in time range)  oxyCODONE (Oxy IR/ROXICODONE) immediate release tablet 5 mg (has no administration in time range)  ondansetron (ZOFRAN) tablet 4 mg (has no administration in time range)    Or  ondansetron (ZOFRAN) injection 4 mg (has no administration in time range)  polyethylene glycol (MIRALAX / GLYCOLAX) packet 17 g (has no administration in time range)  sodium chloride 0.9 % bolus 500 mL (0 mLs Intravenous Stopped 09/22/20 0337)  sodium chloride 0.9 % bolus 1,000 mL (0 mLs Intravenous Stopped 09/22/20 0338)  ondansetron (  ZOFRAN) injection 4 mg (4 mg Intravenous Given 09/22/20 0203)  potassium chloride SA (KLOR-CON) CR tablet 40 mEq (40 mEq Oral Given 09/22/20 0238)  magnesium sulfate IVPB 2 g 50 mL (0 g Intravenous Stopped 09/22/20 0338)  potassium chloride 10 mEq in 100 mL IVPB (0 mEq Intravenous Stopped 09/22/20 1128)    Mobility walks Low fall risk   Focused Assessments    R Recommendations: See Admitting Provider Note  Report given to:   Additional Notes:

## 2020-09-22 NOTE — ED Notes (Signed)
Pt ambulated to restroom without assistance or complication.

## 2020-09-22 NOTE — ED Notes (Signed)
Report given to Stephanie, RN.

## 2020-09-22 NOTE — ED Notes (Signed)
Date and time results received: 09/22/20 0221   Test: K+ Critical Value: 2.5  Name of Provider Notified: Rolland Porter MD

## 2020-09-23 ENCOUNTER — Observation Stay (HOSPITAL_COMMUNITY): Payer: Self-pay

## 2020-09-23 ENCOUNTER — Inpatient Hospital Stay (HOSPITAL_COMMUNITY)
Admit: 2020-09-23 | Discharge: 2020-09-23 | Disposition: A | Discharge status: Home / Self Care | Payer: Self-pay | Attending: Family Medicine | Admitting: Family Medicine

## 2020-09-23 DIAGNOSIS — F1010 Alcohol abuse, uncomplicated: Secondary | ICD-10-CM

## 2020-09-23 DIAGNOSIS — R569 Unspecified convulsions: Secondary | ICD-10-CM

## 2020-09-23 DIAGNOSIS — E876 Hypokalemia: Secondary | ICD-10-CM

## 2020-09-23 LAB — COMPREHENSIVE METABOLIC PANEL
ALT: 43 U/L (ref 0–44)
AST: 127 U/L — ABNORMAL HIGH (ref 15–41)
Albumin: 2.8 g/dL — ABNORMAL LOW (ref 3.5–5.0)
Alkaline Phosphatase: 128 U/L — ABNORMAL HIGH (ref 38–126)
Anion gap: 9 (ref 5–15)
BUN: <5 mg/dL — ABNORMAL LOW (ref 6–20)
CO2: 26 mmol/L (ref 22–32)
Calcium: 8.2 mg/dL — ABNORMAL LOW (ref 8.9–10.3)
Chloride: 102 mmol/L (ref 98–111)
Creatinine, Ser: 0.58 mg/dL (ref 0.44–1.00)
GFR calc Af Amer: >60 mL/min (ref >60)
GFR calc non Af Amer: >60 mL/min (ref >60)
Glucose, Bld: 76 mg/dL (ref 70–99)
Potassium: 2.5 mmol/L — CL (ref 3.5–5.1)
Sodium: 137 mmol/L (ref 135–145)
Total Bilirubin: 2.2 mg/dL — ABNORMAL HIGH (ref 0.3–1.2)
Total Protein: 5.5 g/dL — ABNORMAL LOW (ref 6.5–8.1)

## 2020-09-23 MED ORDER — GADOBUTROL 1 MMOL/ML IV SOLN
5.0000 mL | Freq: Once | INTRAVENOUS | Status: AC | PRN
  Administered 2020-09-23: 5 mL via INTRAVENOUS

## 2020-09-23 MED ORDER — HALOPERIDOL LACTATE 5 MG/ML IJ SOLN
5.0000 mg | Freq: Once | INTRAMUSCULAR | Status: AC
  Administered 2020-09-23: 5 mg via INTRAVENOUS
  Filled 2020-09-23: qty 1

## 2020-09-23 MED ORDER — POTASSIUM CHLORIDE 10 MEQ/100ML IV SOLN
10.0000 meq | INTRAVENOUS | Status: AC
  Administered 2020-09-23 (×5): 10 meq via INTRAVENOUS
  Filled 2020-09-23 (×4): qty 100

## 2020-09-23 NOTE — Procedures (Signed)
Patient Name: SAFINA HUARD  MRN: [id number]  Epilepsy Attending: Lora Havens  Referring Physician/Provider: Dr Dannielle Huh Date: 09/23/2020 Duration: 26.06 mins  Patient history: 44yo F with suspected etoh withdrawal seizure. EEG to evaluate for seizure.  Level of alertness: Awake  AEDs during EEG study: Ativan   Technical aspects: This EEG study was done with scalp electrodes positioned according to the 10-20 International system of electrode placement. Electrical activity was acquired at a sampling rate of 500Hz  and reviewed with a high frequency filter of 70Hz  and a low frequency filter of 1Hz . EEG data were recorded continuously and digitally stored.   Description: No posterior dominant rhythm was seen. There is an excessive amount of 15 to 18 Hz beta activity distributed symmetrically and diffusely.  Physiologic photic driving was not seen during photic stimulation.  Hyperventilation was not performed.     ABNORMALITY -Excessive beta, generalized  IMPRESSION: This study is within normal limits. No seizures or epileptiform discharges were seen throughout the recording. The excessive beta activity seen in the background is most likely due to the effect of benzodiazepine and is a benign EEG pattern.   Colby Reels Barbra Sarks

## 2020-09-23 NOTE — Progress Notes (Signed)
EEG Completed; Results Pending  

## 2020-09-23 NOTE — Progress Notes (Signed)
[facility]  PROGRESS NOTE  Martha Campbell GXQ:[id number] DOB: 19-Oct-1976 DOA: 09/22/2020 PCP: Redmond School, MD   Brief HPI:   44 year old female with history of anxiety, alcohol abuse presents with complaints of seizures.  Patient member lying down 1010 minutes later she remembers waking up.  Patient says that her mother told her that she was having a seizure and had to go to the hospital.  Patient recently quit drinking 2 weeks ago her last drink at that time was half gallon of liquor.  Patient started on CIWA protocol, neurology was consulted and recommended MRI, EEG and Keppra.  Patient was supposed to be transferred to [facility] for MRI brain and EEG.    Subjective   Patient seen and examined, continues to be pleasantly confused.    Assessment/Plan:     1. Alcohol withdrawal seizure-patient quit drinking 2 weeks ago and came for seizure.  There was concern for seizure not due to alcohol withdrawal.  So EEG and MRI was done today.  EEG has been done and result is pending.  MRI brain shows bilateral frontal small subdural collections.  I called and discussed with radiologist, he feels that this are likely chronic and from falling as patient is alcoholic.  They will need to be monitored as outpatient with follow-up imaging, MRI brain in 3 to 6 months.  In the meantime continue with Keppra 500 mg IV every 12 hours. 2. Alcohol withdrawal-continue CIWA protocol 3. Hypokalemia-potassium is still 2.5, will replace potassium and follow BMP in am.  Serum magnesium was  2.0 4. Transaminitis-likely from alcoholic hepatitis.  Improving, AST 127, ALT 43.     COVID-19 Labs  No results for input(s): DDIMER, FERRITIN, LDH, CRP in the last 72 hours.  Lab Results  Component Value Date   SARSCOV2NAA NEGATIVE 09/22/2020   [place] NEGATIVE 12/25/2019     Scheduled medications:   . folic acid  1 mg Oral Daily  . LORazepam  0-4 mg Intravenous Q6H   Or  . LORazepam  0-4 mg  Oral Q6H  . [START ON 09/24/2020] LORazepam  0-4 mg Intravenous Q12H   Or  . [START ON 09/24/2020] LORazepam  0-4 mg Oral Q12H  . nicotine  14 mg Transdermal Daily  . pantoprazole  40 mg Oral Daily  . thiamine  100 mg Oral Daily   Or  . thiamine  100 mg Intravenous Daily  . traZODone  100 mg Oral QHS         CBG: No results for input(s): GLUCAP in the last 168 hours.  SpO2: 100 %    CBC: Recent Labs  Lab 09/22/20 0140 09/22/20 0645  WBC 5.8 6.9  NEUTROABS 4.4 3.8  HGB 13.0 12.5  HCT 35.5* 34.9*  MCV 96.2 97.5  PLT 44* 39*    Basic Metabolic Panel: Recent Labs  Lab 09/22/20 0140 09/22/20 0645 09/23/20 0522  NA 131* 136 137  K 2.5* 3.0* 2.5*  CL 93* 97* 102  CO2 24 27 26   GLUCOSE 115* 81 76  BUN 5* <5* <5*  CREATININE 0.48 0.49 0.58  CALCIUM 8.5* 8.4* 8.2*  MG 1.2* 2.0  --      Liver Function Tests: Recent Labs  Lab 09/22/20 0140 09/22/20 0645 09/23/20 0522  AST 207* 182* 127*  ALT 60* 55* 43  ALKPHOS 173* 159* 128*  BILITOT 3.4* 2.7* 2.2*  PROT 6.7 6.1* 5.5*  ALBUMIN 3.3* 3.1* 2.8*     Antibiotics: Anti-infectives (From admission, onward)  None       DVT prophylaxis: SCDs  Code Status: Full code  Family Communication: No family at bedside    Status is: Inpatient  Dispo: The patient is from: Home              Anticipated d/c is to: Home versus skilled nursing facility              Anticipated d/c date is: 09/25/2020              Patient currently not stable for discharge  Barrier to discharge-alcohol withdrawal          Consultants:    Procedures:     Objective   Vitals:   09/22/20 2230 09/23/20 0509 09/23/20 1200 09/23/20 1340  BP:  (!) 102/91  (!) 112/93  Pulse: 87 (!) 101 (!) 121 95  Resp:    16  Temp:  98.9 F (37.2 C)  98.6 F (37 C)  TempSrc:  Oral  Oral  SpO2:  97%  100%  Weight:      Height:        Intake/Output Summary (Last 24 hours) at 09/23/2020 1714 Last data filed at 09/23/2020  1535 Gross per 24 hour  Intake 820 ml  Output --  Net 820 ml    10/02 1901 - 10/04 0700 In: 2150  Out: -   Filed Weights   09/22/20 0119 09/22/20 2106  Weight: 63.5 kg 54.9 kg    Physical Examination:   General-appears in no acute distress Heart-S1-S2, regular, no murmur auscultated Lungs-clear to auscultation bilaterally, no wheezing or crackles auscultated Abdomen-soft, nontender, no organomegaly Extremities-no edema in the lower extremities Neuro-alert, oriented x3, no focal deficit noted   Data Reviewed:   Recent Results (from the past 240 hour(s))  Respiratory Panel by RT PCR (Flu A&B, Covid) - Nasopharyngeal Swab     Status: None   Collection Time: 09/22/20  4:18 AM   Specimen: Nasopharyngeal Swab  Result Value Ref Range Status   SARS Coronavirus 2 by RT PCR NEGATIVE NEGATIVE Final    Comment: (NOTE) SARS-CoV-2 target nucleic acids are NOT DETECTED.  The SARS-CoV-2 RNA is generally detectable in upper respiratoy specimens during the acute phase of infection. The lowest concentration of SARS-CoV-2 viral copies this assay can detect is 131 copies/mL. A negative result does not preclude SARS-Cov-2 infection and should not be used as the sole basis for treatment or other patient management decisions. A negative result may occur with  improper specimen collection/handling, submission of specimen other than nasopharyngeal swab, presence of viral mutation(s) within the areas targeted by this assay, and inadequate number of viral copies (<131 copies/mL). A negative result must be combined with clinical observations, patient history, and epidemiological information. The expected result is Negative.  Fact Sheet for Patients:  PinkCheek.be  Fact Sheet for Healthcare Providers:  GravelBags.it  This test is no t yet approved or cleared by the [place] FDA and  has been authorized for detection and/or  diagnosis of SARS-CoV-2 by FDA under an Emergency Use Authorization (EUA). This EUA will remain  in effect (meaning this test can be used) for the duration of the COVID-19 declaration under Section 564(b)(1) of the Act, 21 U.S.C. section 360bbb-3(b)(1), unless the authorization is terminated or revoked sooner.     Influenza A by PCR NEGATIVE NEGATIVE Final   Influenza B by PCR NEGATIVE NEGATIVE Final    Comment: (NOTE) The Xpert Xpress SARS-CoV-2/FLU/RSV assay is intended as an aid  in  the diagnosis of influenza from Nasopharyngeal swab specimens and  should not be used as a sole basis for treatment. Nasal washings and  aspirates are unacceptable for Xpert Xpress SARS-CoV-2/FLU/RSV  testing.  Fact Sheet for Patients: PinkCheek.be  Fact Sheet for Healthcare Providers: GravelBags.it  This test is not yet approved or cleared by the [place] FDA and  has been authorized for detection and/or diagnosis of SARS-CoV-2 by  FDA under an Emergency Use Authorization (EUA). This EUA will remain  in effect (meaning this test can be used) for the duration of the  Covid-19 declaration under Section 564(b)(1) of the Act, 21  U.S.C. section 360bbb-3(b)(1), unless the authorization is  terminated or revoked. Performed at [facility], [address] [place], [place] [address]      Studies:  MR BRAIN W WO CONTRAST  Result Date: 09/23/2020 CLINICAL DATA:  Seizure EXAM: MRI HEAD WITHOUT AND WITH CONTRAST TECHNIQUE: Multiplanar, multiecho pulse sequences of the brain and surrounding structures were obtained without and with intravenous contrast. CONTRAST:  80mL GADAVIST GADOBUTROL 1 MMOL/ML IV SOLN COMPARISON:  None. FINDINGS: Motion artifact is present. Brain: There is no acute infarction or intracranial hemorrhage. There is no intracranial mass, mass effect, or edema. There is no hydrocephalus. There are trace bilateral, primarily frontal  convexity subdural collections with dural enhancement. Ventricles and sulci are normal in size and configuration. Small chronic infarct of the central pons. No abnormal parenchymal or leptomeningeal enhancement. Vascular: Major vessel flow voids at the skull base are preserved. Skull and upper cervical spine: Normal marrow signal is preserved. Sinuses/Orbits: Paranasal sinuses are aerated. Orbits are unremarkable. Other: Sella is unremarkable.  Mastoid air cells are clear. IMPRESSION: Trace bilateral, primarily frontal convexity subdural collections without mass effect. Small chronic central pontine infarct. Electronically Signed   By: Macy Mis M.D.   On: 09/23/2020 12:06       [place]   [facility] If 7PM-7AM, please contact night-coverage at [email or web address], Office  [phone]   09/23/2020, 5:14 PM  LOS: 0 days

## 2020-09-24 LAB — COMPREHENSIVE METABOLIC PANEL
ALT: 43 U/L (ref 0–44)
AST: 121 U/L — ABNORMAL HIGH (ref 15–41)
Albumin: 2.6 g/dL — ABNORMAL LOW (ref 3.5–5.0)
Alkaline Phosphatase: 102 U/L (ref 38–126)
Anion gap: 7 (ref 5–15)
BUN: <5 mg/dL — ABNORMAL LOW (ref 6–20)
CO2: 25 mmol/L (ref 22–32)
Calcium: 8.5 mg/dL — ABNORMAL LOW (ref 8.9–10.3)
Chloride: 106 mmol/L (ref 98–111)
Creatinine, Ser: 0.42 mg/dL — ABNORMAL LOW (ref 0.44–1.00)
GFR calc Af Amer: >60 mL/min (ref >60)
GFR calc non Af Amer: >60 mL/min (ref >60)
Glucose, Bld: 93 mg/dL (ref 70–99)
Potassium: 2.8 mmol/L — ABNORMAL LOW (ref 3.5–5.1)
Sodium: 138 mmol/L (ref 135–145)
Total Bilirubin: 1.3 mg/dL — ABNORMAL HIGH (ref 0.3–1.2)
Total Protein: 4.9 g/dL — ABNORMAL LOW (ref 6.5–8.1)

## 2020-09-24 MED ORDER — LEVETIRACETAM 500 MG PO TABS
500.0000 mg | ORAL_TABLET | Freq: Two times a day (BID) | ORAL | Status: DC
  Administered 2020-09-24 – 2020-09-25 (×3): 500 mg via ORAL
  Filled 2020-09-24 (×3): qty 1

## 2020-09-24 MED ORDER — POTASSIUM CHLORIDE 10 MEQ/100ML IV SOLN
10.0000 meq | INTRAVENOUS | Status: AC
  Administered 2020-09-24 (×4): 10 meq via INTRAVENOUS
  Filled 2020-09-24 (×4): qty 100

## 2020-09-24 MED ORDER — SODIUM CHLORIDE 0.9 % IV SOLN: INTRAVENOUS | Status: DC

## 2020-09-24 NOTE — TOC Initial Note (Signed)
Transition of Care [facility]) - Initial/Assessment Note   Patient Details  Name: ONNIE ALATORRE MRN: [id number] Date of Birth: June 03, 1976  [facility]) CM/SW Contact:    Sherie Don, LCSW Phone Number: 09/24/2020, 11:17 AM  Clinical Narrative: Patient is a 44 year old female who was admitted for seizure and ETOH withdrawal. Per chart review, patient does not have insurance. CSW spoke with patient regarding referral to financial counselor. Patient agreeable to referral. CSW made referral to financial counselor, Tito Dine. TOC to follow.  Expected Discharge Plan: Home/Self Care Barriers to Discharge: Inadequate or no insurance, Continued Medical Work up  Expected Discharge Plan and Services Expected Discharge Plan: Home/Self Care In-house Referral: Clinical Social Work, Development worker, community Living arrangements for the past 2 months: [place]  Prior Living Arrangements/Services Living arrangements for the past 2 months: [place] Patient language and need for interpreter reviewed:: Yes Do you feel safe going back to the place where you live?: Yes      Need for Family Participation in Patient Care: No (Comment) Care giver support system in place?: Yes (comment) Criminal Activity/Legal Involvement Pertinent to Current Situation/Hospitalization: No - Comment as needed  Activities of Daily Living Home Assistive Devices/Equipment: None ADL Screening (condition at time of admission) Patient's cognitive ability adequate to safely complete daily activities?: Yes Is the patient deaf or have difficulty hearing?: No Does the patient have difficulty seeing, even when wearing glasses/contacts?: No Does the patient have difficulty concentrating, remembering, or making decisions?: No Patient able to express need for assistance with ADLs?: Yes Does the patient have difficulty dressing or bathing?: No Independently performs ADLs?: Yes (appropriate for developmental age) Does  the patient have difficulty walking or climbing stairs?: No Weakness of Legs: None Weakness of Arms/Hands: None  Permission Sought/Granted Permission sought to share information with : Other (comment) Public house manager) Permission granted to share information with : Yes, Verbal Permission Granted Share Information with NAME: Financial counselor Tito Dine)  Emotional Assessment Appearance:: Appears stated age Attitude/Demeanor/Rapport: Engaged Affect (typically observed): Accepting Orientation: : Oriented to Self, Oriented to Place, Oriented to  Time, Oriented to Situation Alcohol / Substance Use: Alcohol Use Psych Involvement: No (comment)  Admission diagnosis:  Alcohol withdrawal ([place]) [F10.239] Hypokalemia [E87.6] Hypomagnesemia [E83.42] Alcohol abuse [F10.10] Seizure ([place]) [R56.9] Patient Active Problem List   Diagnosis Date Noted  . Alcohol withdrawal ([place]) 09/22/2020  . Seizure ([place]) 12/25/2019  . ETOH abuse 12/25/2019  . Delirium tremens with Seizure 12/25/2019  . Tobacco abuse 12/25/2019   PCP:  Redmond School, MD Pharmacy:   [facility] [place], [place] AT [place] [address] [place] [address] Phone: [phone] Fax: [phone]  Readmission Risk Interventions No flowsheet data found.

## 2020-09-24 NOTE — Progress Notes (Signed)
[facility]  PROGRESS NOTE  Martha Campbell BSJ:[id number] DOB: [birth year] DOA: 09/22/2020 PCP: Redmond School, MD   Brief HPI:   43 year old female with history of anxiety, alcohol abuse presents with complaints of seizures.  Patient member lying down 1010 minutes later she remembers waking up.  Patient says that her mother told her that she was having a seizure and had to go to the hospital.  Patient recently quit drinking 2 weeks ago her last drink at that time was half gallon of liquor.  Patient started on CIWA protocol, neurology was consulted and recommended MRI, EEG and Keppra.  Patient was supposed to be transferred to [facility] for MRI brain and EEG.    Subjective   Patient seen and examined, she is significantly improved.  No tremors.   Assessment/Plan:     1. Alcohol withdrawal seizure-patient quit drinking 2 weeks ago and came for seizure.  There was concern for seizure not due to alcohol withdrawal.  So EEG and MRI was done today.  EEG showed no seizures or epileptiform discharges.   MRI brain shows bilateral frontal small subdural collections.  I called and discussed with radiologist, he feels that this are likely chronic and from falling as patient is alcoholic.  They will need to be monitored as outpatient with follow-up imaging, MRI brain in 3 to 6 months.  In the meantime continue with Keppra 500 mg po every 12 hours. 2. Alcohol withdrawal-continue CIWA protocol 3. Hypokalemia-potassium is still 2.8, will replace potassium and follow BMP in am.  Serum magnesium was  2.0 4. Transaminitis-likely from alcoholic hepatitis.  Improving, AST 121, ALT 43     COVID-19 Labs  No results for input(s): DDIMER, FERRITIN, LDH, CRP in the last 72 hours.  Lab Results  Component Value Date   SARSCOV2NAA NEGATIVE 09/22/2020   [place] NEGATIVE 12/25/2019     Scheduled medications:   . folic acid  1 mg Oral Daily  . levETIRAcetam  500 mg Oral BID  . LORazepam   0-4 mg Intravenous Q12H   Or  . LORazepam  0-4 mg Oral Q12H  . nicotine  14 mg Transdermal Daily  . pantoprazole  40 mg Oral Daily  . thiamine  100 mg Oral Daily   Or  . thiamine  100 mg Intravenous Daily  . traZODone  100 mg Oral QHS      CBG: No results for input(s): GLUCAP in the last 168 hours.  SpO2: 98 %    CBC: Recent Labs  Lab 09/22/20 0140 09/22/20 0645  WBC 5.8 6.9  NEUTROABS 4.4 3.8  HGB 13.0 12.5  HCT 35.5* 34.9*  MCV 96.2 97.5  PLT 44* 39*    Basic Metabolic Panel: Recent Labs  Lab 09/22/20 0140 09/22/20 0645 09/23/20 0522 09/24/20 0606  NA 131* 136 137 138  K 2.5* 3.0* 2.5* 2.8*  CL 93* 97* 102 106  CO2 24 27 26 25   GLUCOSE 115* 81 76 93  BUN 5* <5* <5* <5*  CREATININE 0.48 0.49 0.58 0.42*  CALCIUM 8.5* 8.4* 8.2* 8.5*  MG 1.2* 2.0  --   --      Liver Function Tests: Recent Labs  Lab 09/22/20 0140 09/22/20 0645 09/23/20 0522 09/24/20 0606  AST 207* 182* 127* 121*  ALT 60* 55* 43 43  ALKPHOS 173* 159* 128* 102  BILITOT 3.4* 2.7* 2.2* 1.3*  PROT 6.7 6.1* 5.5* 4.9*  ALBUMIN 3.3* 3.1* 2.8* 2.6*     Antibiotics: Anti-infectives (From  admission, onward)   None       DVT prophylaxis: SCDs  Code Status: Full code  Family Communication: No family at bedside    Status is: Inpatient  Dispo: The patient is from: Home              Anticipated d/c is to: Home versus skilled nursing facility              Anticipated d/c date is: 09/25/2020              Patient currently not stable for discharge  Barrier to discharge-alcohol withdrawal, hypokalemia treatment, PT evaluation      Consultants:    Procedures:     Objective   Vitals:   09/23/20 1200 09/23/20 1340 09/24/20 0100 09/24/20 0600  BP:  (!) 112/93 (!) 120/98 106/85  Pulse: (!) 121 95 78 84  Resp:  16 15 16   Temp:  98.6 F (37 C) 97.8 F (36.6 C) 98.6 F (37 C)  TempSrc:  Oral    SpO2:  100% 100% 98%  Weight:      Height:        Intake/Output  Summary (Last 24 hours) at 09/24/2020 1223 Last data filed at 09/23/2020 1853 Gross per 24 hour  Intake 960 ml  Output --  Net 960 ml    10/03 1901 - 10/05 0700 In: 1200 [P.O.:1200] Out: -   Filed Weights   09/22/20 0119 09/22/20 2106  Weight: 63.5 kg 54.9 kg    Physical Examination:   General-appears in no acute distress Heart-S1-S2, regular, no murmur auscultated Lungs-clear to auscultation bilaterally, no wheezing or crackles auscultated Abdomen-soft, nontender, no organomegaly Extremities-no edema in the lower extremities Neuro-alert, oriented x3, no focal deficit noted  Data Reviewed:   Recent Results (from the past 240 hour(s))  Respiratory Panel by RT PCR (Flu A&B, Covid) - Nasopharyngeal Swab     Status: None   Collection Time: 09/22/20  4:18 AM   Specimen: Nasopharyngeal Swab  Result Value Ref Range Status   SARS Coronavirus 2 by RT PCR NEGATIVE NEGATIVE Final    Comment: (NOTE) SARS-CoV-2 target nucleic acids are NOT DETECTED.  The SARS-CoV-2 RNA is generally detectable in upper respiratoy specimens during the acute phase of infection. The lowest concentration of SARS-CoV-2 viral copies this assay can detect is 131 copies/mL. A negative result does not preclude SARS-Cov-2 infection and should not be used as the sole basis for treatment or other patient management decisions. A negative result may occur with  improper specimen collection/handling, submission of specimen other than nasopharyngeal swab, presence of viral mutation(s) within the areas targeted by this assay, and inadequate number of viral copies (<131 copies/mL). A negative result must be combined with clinical observations, patient history, and epidemiological information. The expected result is Negative.  Fact Sheet for Patients:  PinkCheek.be  Fact Sheet for Healthcare Providers:  GravelBags.it  This test is no t yet approved or  cleared by the [place] FDA and  has been authorized for detection and/or diagnosis of SARS-CoV-2 by FDA under an Emergency Use Authorization (EUA). This EUA will remain  in effect (meaning this test can be used) for the duration of the COVID-19 declaration under Section 564(b)(1) of the Act, 21 U.S.C. section 360bbb-3(b)(1), unless the authorization is terminated or revoked sooner.     Influenza A by PCR NEGATIVE NEGATIVE Final   Influenza B by PCR NEGATIVE NEGATIVE Final    Comment: (NOTE) The Xpert Xpress SARS-CoV-2/FLU/RSV assay  is intended as an aid in  the diagnosis of influenza from Nasopharyngeal swab specimens and  should not be used as a sole basis for treatment. Nasal washings and  aspirates are unacceptable for Xpert Xpress SARS-CoV-2/FLU/RSV  testing.  Fact Sheet for Patients: PinkCheek.be  Fact Sheet for Healthcare Providers: GravelBags.it  This test is not yet approved or cleared by the [place] FDA and  has been authorized for detection and/or diagnosis of SARS-CoV-2 by  FDA under an Emergency Use Authorization (EUA). This EUA will remain  in effect (meaning this test can be used) for the duration of the  Covid-19 declaration under Section 564(b)(1) of the Act, 21  U.S.C. section 360bbb-3(b)(1), unless the authorization is  terminated or revoked. Performed at [facility], [address], [place], [place] [address]      Studies:  EEG  Result Date: 09/23/2020 Lora Havens, MD     09/23/2020  5:49 PM Patient Name: Martha Campbell MRN: [id number] Epilepsy Attending: Lora Havens Referring Physician/Provider: Dr Dannielle Huh Date: 09/23/2020 Duration: 26.06 mins Patient history: 44yo F with suspected etoh withdrawal seizure. EEG to evaluate for seizure. Level of alertness: Awake AEDs during EEG study: Ativan Technical aspects: This EEG study was done with scalp electrodes positioned according to the  10-20 International system of electrode placement. Electrical activity was acquired at a sampling rate of 500Hz  and reviewed with a high frequency filter of 70Hz  and a low frequency filter of 1Hz . EEG data were recorded continuously and digitally stored. Description: No posterior dominant rhythm was seen. There is an excessive amount of 15 to 18 Hz beta activity distributed symmetrically and diffusely.  Physiologic photic driving was not seen during photic stimulation.  Hyperventilation was not performed.   ABNORMALITY -Excessive beta, generalized IMPRESSION: This study is within normal limits. No seizures or epileptiform discharges were seen throughout the recording. The excessive beta activity seen in the background is most likely due to the effect of benzodiazepine and is a benign EEG pattern. Lora Havens   MR BRAIN W WO CONTRAST  Result Date: 09/23/2020 CLINICAL DATA:  Seizure EXAM: MRI HEAD WITHOUT AND WITH CONTRAST TECHNIQUE: Multiplanar, multiecho pulse sequences of the brain and surrounding structures were obtained without and with intravenous contrast. CONTRAST:  43mL GADAVIST GADOBUTROL 1 MMOL/ML IV SOLN COMPARISON:  None. FINDINGS: Motion artifact is present. Brain: There is no acute infarction or intracranial hemorrhage. There is no intracranial mass, mass effect, or edema. There is no hydrocephalus. There are trace bilateral, primarily frontal convexity subdural collections with dural enhancement. Ventricles and sulci are normal in size and configuration. Small chronic infarct of the central pons. No abnormal parenchymal or leptomeningeal enhancement. Vascular: Major vessel flow voids at the skull base are preserved. Skull and upper cervical spine: Normal marrow signal is preserved. Sinuses/Orbits: Paranasal sinuses are aerated. Orbits are unremarkable. Other: Sella is unremarkable.  Mastoid air cells are clear. IMPRESSION: Trace bilateral, primarily frontal convexity subdural collections  without mass effect. Small chronic central pontine infarct. Electronically Signed   By: Macy Mis M.D.   On: 09/23/2020 12:06       [place]   [facility] If 7PM-7AM, please contact night-coverage at [email or web address], Office  [phone]   09/24/2020, 12:23 PM  LOS: 1 day

## 2020-09-24 NOTE — Evaluation (Signed)
Physical Therapy Evaluation Patient Details Name: Martha Campbell MRN: [id number] DOB: [birth year] Today's Date: 09/24/2020   History of Present Illness  44 yo female with PMH significant for anxiety and alcohol abuse, currently with complaint of seizure activity witnessed by pt's mother.  Clinical Impression  Pt admitted with above diagnosis. Pt slightly below baseline, able to ambulate in hallway and around room with IV pole to steady. PTA, pt home alone, independent with all mobility, driving, community ambulator without AD and mom visiting often from [place], [place]. Pt currently with functional limitations due to the deficits listed below (see PT Problem List). Pt will benefit from skilled PT to increase their independence and safety with mobility to allow discharge to the venue listed below.       Follow Up Recommendations No PT follow up    Equipment Recommendations  None recommended by PT    Recommendations for Other Services       Precautions / Restrictions Precautions Precautions: Other (comment) Precaution Comments: monitor HR Restrictions Weight Bearing Restrictions: No      Mobility  Bed Mobility Overal bed mobility: Independent  General bed mobility comments: sitting EOB upon arrival  Transfers Overall transfer level: Modified independent Equipment used: None  General transfer comment: BUE assisting to power up  Ambulation/Gait Ambulation/Gait assistance: Supervision Gait Distance (Feet): 140 Feet Assistive device: IV Pole Gait Pattern/deviations: WFL(Within Functional Limits) Gait velocity: WFL   General Gait Details: pt ambulates around room and in hallway with IV pole to steady, no overt LOB with straightline gait or turns, limited due to HR  Stairs            Wheelchair Mobility    Modified Rankin (Stroke Patients Only)       Balance Overall balance assessment: No apparent balance deficits (not formally assessed)          Pertinent  Vitals/Pain Pain Assessment: No/denies pain    Home Living Family/patient expects to be discharged to:: Private residence Living Arrangements: Alone Available Help at Discharge: Family;Available PRN/intermittently Type of Home: House Home Access: Stairs to enter Entrance Stairs-Rails: Right;Left;Can reach both Entrance Stairs-Number of Steps: 4 Home Layout: Laundry or work area in [employer]: None      Prior Function Level of Independence: Independent         Comments: Pt reports independent with ADLs, community ambulation without AD, denies falls, still drives. Pt reports mom lives in [place], [place] and visits often.     Hand Dominance        Extremity/Trunk Assessment   Upper Extremity Assessment Upper Extremity Assessment: Overall WFL for tasks assessed    Lower Extremity Assessment Lower Extremity Assessment: Overall WFL for tasks assessed (symmetrical, strength 4+/5, AROM WNL, denies numbness/tingling)    Cervical / Trunk Assessment Cervical / Trunk Assessment: Normal  Communication   Communication: No difficulties  Cognition Arousal/Alertness: Awake/alert Behavior During Therapy: WFL for tasks assessed/performed Overall Cognitive Status: Within Functional Limits for tasks assessed  General Comments: Pt a&o x4. Pt tearful at one point stating the hospital room was depressing and she wanted to get back out into the world.      General Comments General comments (skin integrity, edema, etc.): HR 130bpm max with ambulation    Exercises     Assessment/Plan    PT Assessment Patient needs continued PT services  PT Problem List Decreased activity tolerance;Decreased balance       PT Treatment Interventions DME instruction;Gait training;Stair training;Functional mobility training;Therapeutic activities;Therapeutic exercise;Balance training;Neuromuscular  re-education;Patient/family education    PT Goals (Current goals can be found in the Care Plan  section)  Acute Rehab PT Goals Patient Stated Goal: return home PT Goal Formulation: With patient Time For Goal Achievement: 10/01/20 Potential to Achieve Goals: Good    Frequency Min 3X/week   Barriers to discharge        Co-evaluation               AM-PAC PT "6 Clicks" Mobility  Outcome Measure Help needed turning from your back to your side while in a flat bed without using bedrails?: None Help needed moving from lying on your back to sitting on the side of a flat bed without using bedrails?: None Help needed moving to and from a bed to a chair (including a wheelchair)?: None Help needed standing up from a chair using your arms (e.g., wheelchair or bedside chair)?: None Help needed to walk in hospital room?: None Help needed climbing 3-5 steps with a railing? : None 6 Click Score: 24    End of Session   Activity Tolerance: Patient tolerated treatment well Patient left: in bed;with call bell/phone within reach;with bed alarm set;with nursing/sitter in room Nurse Communication: Mobility status;Other (comment) (HR) PT Visit Diagnosis: Other abnormalities of gait and mobility (R26.89)    Time: 1240-1300 PT Time Calculation (min) (ACUTE ONLY): 20 min   Charges:   PT Evaluation $PT Eval Low Complexity: 1 Low           Tori Finch Costanzo PT, DPT 09/24/20, 3:06 PM [phone]

## 2020-09-24 NOTE — Plan of Care (Signed)
°  Problem: Acute Rehab PT Goals(only PT should resolve) Goal: Pt Will Ambulate Outcome: Progressing Flowsheets (Taken 09/24/2020 1510) Pt will Ambulate:  > 125 feet  Independently Goal: Pt/caregiver will Perform Home Exercise Program Outcome: Progressing Flowsheets (Taken 09/24/2020 1510) Pt/caregiver will Perform Home Exercise Program: Independently   Tori Beatris Belen PT, DPT 09/24/20, 3:10 PM [phone]

## 2020-09-25 LAB — BASIC METABOLIC PANEL
Anion gap: 6 (ref 5–15)
BUN: <5 mg/dL — ABNORMAL LOW (ref 6–20)
CO2: 26 mmol/L (ref 22–32)
Calcium: 9.4 mg/dL (ref 8.9–10.3)
Chloride: 105 mmol/L (ref 98–111)
Creatinine, Ser: 0.53 mg/dL (ref 0.44–1.00)
GFR calc non Af Amer: >60 mL/min (ref >60)
Glucose, Bld: 81 mg/dL (ref 70–99)
Potassium: 3.7 mmol/L (ref 3.5–5.1)
Sodium: 137 mmol/L (ref 135–145)

## 2020-09-25 NOTE — Progress Notes (Signed)
PROGRESS NOTE    Martha Campbell  SVX:[id number] DOB: [birth year] DOA: 09/22/2020 PCP: Redmond School, MD   Brief Narrative:  This 44 year old female with history of anxiety, alcohol abuse presents with complaints of seizures. Patient doesn't recall what happened, Patient says that her mother told her that she was having a seizure and had to go to the hospital. Patient recently quit drinking 2 weeks ago,  her last drink at that time was half gallon of liquor. Patient started on CIWA protocol, neurology was consulted and recommended MRI, EEG and Keppra. Patient was supposed to be transferred to [facility] for MRI brain and EEG. EEG shows no seizures or epileptiform discharges, MRI shows bilateral frontal subdural collections.  Patient is continued on Keppra.   Assessment & Plan:   Active Problems:   Seizure ([place])   Alcohol withdrawal ([place])  1. Alcohol withdrawal seizure :  Patient quit drinking 2 weeks ago and presents with seizure.  There was concern for seizure not due to alcohol withdrawal.  So EEG and MRI was done today.  EEG showed no seizures or epileptiform discharges.   MRI brain shows bilateral frontal small subdural collections. Previous hospitalist discussed with radiologist on call , he feels that this are likely chronic and from falling as patient is alcoholic.  They will need to be monitored as outpatient with follow-up imaging, MRI brain in 3 to 6 months.  In the meantime continue with Keppra 500 mg po every 12 hours.  2. Alcohol withdrawal-continue CIWA protocol.  3. Hypokalemia- Potassium replaced and improved. Serum magnesium was  2.0  4. Transaminitis-likely from alcoholic hepatitis.  Improving, AST 121, ALT 43.      Hepatitis profile negative  5.  Thrombocytopenia:  this could be secondary to alcohol.   DVT prophylaxis: SCDs  Thrombocytopenia Code Status: Full Family Communication:  No one at bed side. Disposition Plan:  Status is: Inpatient  Remains  inpatient appropriate because:Inpatient level of care appropriate due to severity of illness   Dispo: The patient is from: Home              Anticipated d/c is to: Home              Anticipated d/c date is: 1 day              Patient currently is not medically stable to d/c.  Barrier to discharge-alcohol withdrawal,  PT evaluation, Neuro clearance.  Consultants:   Neurology  Procedures: MRI. EEG Antimicrobials:  Anti-infectives (From admission, onward)   None     Subjective: Patient was seen and examined at bedside.  Overnight events noted. Patient reports feeling better.  She denies any headache, dizziness or nausea and vomiting.  Objective: Vitals:   09/24/20 1543 09/24/20 2005 09/25/20 0114 09/25/20 0600  BP: (!) 110/91  104/81 100/78  Pulse: (!) 110  89 100  Resp:   18 16  Temp: 97.8 F (36.6 C)  98.7 F (37.1 C) 97.9 F (36.6 C)  TempSrc: Oral  Oral   SpO2: 100% 98% 98% 99%  Weight:      Height:        Intake/Output Summary (Last 24 hours) at 09/25/2020 1207 Last data filed at 09/25/2020 0600 Gross per 24 hour  Intake 1374.23 ml  Output 600 ml  Net 774.23 ml   Filed Weights   09/22/20 0119 09/22/20 2106  Weight: 63.5 kg 54.9 kg    Examination:  General exam: Appears calm and comfortable  Respiratory system: Clear to auscultation. Respiratory effort normal. Cardiovascular system: S1 & S2 heard, RRR. No JVD, murmurs, rubs, gallops or clicks. No pedal edema. Gastrointestinal system: Abdomen is nondistended, soft and nontender. No organomegaly or masses felt. Normal bowel sounds heard. Central nervous system: Alert and oriented. No focal neurological deficits. Extremities:  No edema, No clubbing, No cyanosis. Skin: No rashes, lesions or ulcers Psychiatry: Judgement and insight appear normal. Mood & affect appropriate.     Data Reviewed: I have personally reviewed following labs and imaging studies  CBC: Recent Labs  Lab 09/22/20 0140 09/22/20 0645    WBC 5.8 6.9  NEUTROABS 4.4 3.8  HGB 13.0 12.5  HCT 35.5* 34.9*  MCV 96.2 97.5  PLT 44* 39*   Basic Metabolic Panel: Recent Labs  Lab 09/22/20 0140 09/22/20 0645 09/23/20 0522 09/24/20 0606 09/25/20 0552  NA 131* 136 137 138 137  K 2.5* 3.0* 2.5* 2.8* 3.7  CL 93* 97* 102 106 105  CO2 24 27 26 25 26   GLUCOSE 115* 81 76 93 81  BUN 5* <5* <5* <5* <5*  CREATININE 0.48 0.49 0.58 0.42* 0.53  CALCIUM 8.5* 8.4* 8.2* 8.5* 9.4  MG 1.2* 2.0  --   --   --    GFR: Estimated Creatinine Clearance: 76.2 mL/min (by C-G formula based on SCr of 0.53 mg/dL). Liver Function Tests: Recent Labs  Lab 09/22/20 0140 09/22/20 0645 09/23/20 0522 09/24/20 0606  AST 207* 182* 127* 121*  ALT 60* 55* 43 43  ALKPHOS 173* 159* 128* 102  BILITOT 3.4* 2.7* 2.2* 1.3*  PROT 6.7 6.1* 5.5* 4.9*  ALBUMIN 3.3* 3.1* 2.8* 2.6*   No results for input(s): LIPASE, AMYLASE in the last 168 hours. No results for input(s): AMMONIA in the last 168 hours. Coagulation Profile: No results for input(s): INR, PROTIME in the last 168 hours. Cardiac Enzymes: No results for input(s): CKTOTAL, CKMB, CKMBINDEX, TROPONINI in the last 168 hours. BNP (last 3 results) No results for input(s): PROBNP in the last 8760 hours. HbA1C: No results for input(s): HGBA1C in the last 72 hours. CBG: No results for input(s): GLUCAP in the last 168 hours. Lipid Profile: No results for input(s): CHOL, HDL, LDLCALC, TRIG, CHOLHDL, LDLDIRECT in the last 72 hours. Thyroid Function Tests: No results for input(s): TSH, T4TOTAL, FREET4, T3FREE, THYROIDAB in the last 72 hours. Anemia Panel: No results for input(s): VITAMINB12, FOLATE, FERRITIN, TIBC, IRON, RETICCTPCT in the last 72 hours. Sepsis Labs: No results for input(s): PROCALCITON, LATICACIDVEN in the last 168 hours.  Recent Results (from the past 240 hour(s))  Respiratory Panel by RT PCR (Flu A&B, Covid) - Nasopharyngeal Swab     Status: None   Collection Time: 09/22/20  4:18 AM    Specimen: Nasopharyngeal Swab  Result Value Ref Range Status   SARS Coronavirus 2 by RT PCR NEGATIVE NEGATIVE Final    Comment: (NOTE) SARS-CoV-2 target nucleic acids are NOT DETECTED.  The SARS-CoV-2 RNA is generally detectable in upper respiratoy specimens during the acute phase of infection. The lowest concentration of SARS-CoV-2 viral copies this assay can detect is 131 copies/mL. A negative result does not preclude SARS-Cov-2 infection and should not be used as the sole basis for treatment or other patient management decisions. A negative result may occur with  improper specimen collection/handling, submission of specimen other than nasopharyngeal swab, presence of viral mutation(s) within the areas targeted by this assay, and inadequate number of viral copies (<131 copies/mL). A negative result must be combined  with clinical observations, patient history, and epidemiological information. The expected result is Negative.  Fact Sheet for Patients:  PinkCheek.be  Fact Sheet for Healthcare Providers:  GravelBags.it  This test is no t yet approved or cleared by the [place] FDA and  has been authorized for detection and/or diagnosis of SARS-CoV-2 by FDA under an Emergency Use Authorization (EUA). This EUA will remain  in effect (meaning this test can be used) for the duration of the COVID-19 declaration under Section 564(b)(1) of the Act, 21 U.S.C. section 360bbb-3(b)(1), unless the authorization is terminated or revoked sooner.     Influenza A by PCR NEGATIVE NEGATIVE Final   Influenza B by PCR NEGATIVE NEGATIVE Final    Comment: (NOTE) The Xpert Xpress SARS-CoV-2/FLU/RSV assay is intended as an aid in  the diagnosis of influenza from Nasopharyngeal swab specimens and  should not be used as a sole basis for treatment. Nasal washings and  aspirates are unacceptable for Xpert Xpress SARS-CoV-2/FLU/RSV   testing.  Fact Sheet for Patients: PinkCheek.be  Fact Sheet for Healthcare Providers: GravelBags.it  This test is not yet approved or cleared by the [place] FDA and  has been authorized for detection and/or diagnosis of SARS-CoV-2 by  FDA under an Emergency Use Authorization (EUA). This EUA will remain  in effect (meaning this test can be used) for the duration of the  Covid-19 declaration under Section 564(b)(1) of the Act, 21  U.S.C. section 360bbb-3(b)(1), unless the authorization is  terminated or revoked. Performed at [facility], [address] [place], [place] [address]      Radiology Studies: EEG  Result Date: 09/23/2020 Lora Havens, MD     09/23/2020  5:49 PM Patient Name: LIZZETE GOUGH MRN: [id number] Epilepsy Attending: Lora Havens Referring Physician/Provider: Dr Dannielle Huh Date: 09/23/2020 Duration: 26.06 mins Patient history: 44yo F with suspected etoh withdrawal seizure. EEG to evaluate for seizure. Level of alertness: Awake AEDs during EEG study: Ativan Technical aspects: This EEG study was done with scalp electrodes positioned according to the 10-20 International system of electrode placement. Electrical activity was acquired at a sampling rate of 500Hz  and reviewed with a high frequency filter of 70Hz  and a low frequency filter of 1Hz . EEG data were recorded continuously and digitally stored. Description: No posterior dominant rhythm was seen. There is an excessive amount of 15 to 18 Hz beta activity distributed symmetrically and diffusely.  Physiologic photic driving was not seen during photic stimulation.  Hyperventilation was not performed.   ABNORMALITY -Excessive beta, generalized IMPRESSION: This study is within normal limits. No seizures or epileptiform discharges were seen throughout the recording. The excessive beta activity seen in the background is most likely due to the effect of  benzodiazepine and is a benign EEG pattern. Priyanka Barbra Sarks    Scheduled Meds: . folic acid  1 mg Oral Daily  . levETIRAcetam  500 mg Oral BID  . LORazepam  0-4 mg Intravenous Q12H   Or  . LORazepam  0-4 mg Oral Q12H  . nicotine  14 mg Transdermal Daily  . pantoprazole  40 mg Oral Daily  . thiamine  100 mg Oral Daily   Or  . thiamine  100 mg Intravenous Daily  . traZODone  100 mg Oral QHS   Continuous Infusions: . sodium chloride 100 mL/hr at 09/24/20 1832     LOS: 2 days    Time spent: 25 mins    Yasaman Kolek, MD [facility]   If  7PM-7AM, please contact night-coverage

## 2020-09-25 NOTE — Progress Notes (Signed)
Pt came to desk, wanting to leave hospital.  Risks of leaving against medical advice communicated to pt.  Plan of care relayed to pt, informing her that neurologist will be coming to see her tonight, discharge likely to be ordered tomorrow.  She left to her room and then came back and decided to leave.  AMA paper signed and copy given to pt.

## 2020-09-26 NOTE — Discharge Summary (Signed)
Physician Discharge Summary  Martha Campbell WFU:[id number] DOB: December 01, 1976 DOA: 09/22/2020  PCP: Redmond School, MD  Admit date: 09/22/2020   Discharge date:  09/25/2020.  Admitted From:  Home Disposition:  Left against medical advice.  Recommendations for Outpatient Follow-up:  1. Follow up with PCP in 1-2 weeks 2. Please obtain BMP/CBC in one week  Home Health: None Equipment/Devices: None  Discharge Condition: Fair CODE STATUS:Full code Diet recommendation: Heart Healthy   Brief [facility] course: This 44 year old female with history of anxiety, alcohol abuse presents with complaints of seizures. Patient doesn't recall what happened, Patient says that her mother told her that she was having a seizure and had to go to the hospital. Patient recently quit drinking 2 weeks ago,  her last drink at that time was half gallon of liquor. Patient started on CIWA protocol, Neurology was consulted and recommended MRI, EEG and Keppra. Patient was supposed to be transferred to [facility] for MRI brain and EEG. Patient refused to go to [facility], She was managed at [facility]. EEG shows no seizures or epileptiform discharges, MRI shows bilateral frontal subdural collections. Radiologist on call recommended observation, no intervention, appears most likely chronic.  Patient was continued on Keppra.  Patient wants to leave [place],  Explained in detail about the risks and benefits. She understood and agreed to stay overnight until seen by neurology.  Later in the day patient has decided to leave [place].  She was managed for below problems.  Discharge Diagnoses:  Active Problems:   Seizure ([place])   Alcohol withdrawal ([place])  1. Alcohol withdrawal seizure :  Patient quit drinking 2 weeks ago and presents with seizure. There was concern for seizure not due to alcohol withdrawal. So EEG and MRI was done today. EEGshowed no seizures or  epileptiform discharges. MRI brain shows bilateral frontal small subdural collections. Previous hospitalist discussed with radiologist on call , he feels that this are likely chronic and from falling as patient is alcoholic. They will need to be monitored as outpatient with follow-up imaging, MRI brain in 3 to 6 months. In the meantime continue with Keppra 500 mg poevery 12 hours.  2. Alcohol withdrawal-continue CIWA protocol.  3. Hypokalemia- Potassium replaced and improved. Serum magnesium was 2.0  4. Transaminitis-likely from alcoholic hepatitis. Improving, AST121, ALT 43.      Hepatitis profile negative  5.  Thrombocytopenia:  this could be secondary to alcohol.  Discharge Instructions   Allergies as of 09/25/2020      Reactions   Penicillins Nausea And Vomiting, Swelling   Stomach upset      Medication List    ASK your doctor about these medications   folic acid 1 MG tablet Commonly known as: FOLVITE Take 1 tablet (1 mg total) by mouth daily.   furosemide 40 MG tablet Commonly known as: LASIX Take 1 tablet (40 mg total) by mouth daily.   nicotine 14 mg/24hr patch Commonly known as: NICODERM CQ - dosed in mg/24 hours Place 1 patch (14 mg total) onto the skin daily.   ondansetron 4 MG tablet Commonly known as: ZOFRAN Take 1 tablet (4 mg total) by mouth every 6 (six) hours as needed for nausea.   pantoprazole 40 MG tablet Commonly known as: PROTONIX Take 1 tablet (40 mg total) by mouth daily.   potassium chloride SA 20 MEQ tablet Commonly known as: KLOR-CON Take 1 tablet (20 mEq total) by mouth 2 (two) times daily.  thiamine 100 MG tablet Take 1 tablet (100 mg total) by mouth daily.   traZODone 100 MG tablet Commonly known as: DESYREL Take 1 tablet (100 mg total) by mouth at bedtime.       Allergies  Allergen Reactions  . Penicillins Nausea And Vomiting and Swelling    Stomach upset     Consultations:  Neurology   Procedures/Studies: EEG  Result Date: 09/23/2020 Lora Havens, MD     09/23/2020  5:49 PM Patient Name: Martha Campbell MRN: [id number] Epilepsy Attending: Lora Havens Referring Physician/Provider: Dr Dannielle Huh Date: 09/23/2020 Duration: 26.06 mins Patient history: 44yo F with suspected etoh withdrawal seizure. EEG to evaluate for seizure. Level of alertness: Awake AEDs during EEG study: Ativan Technical aspects: This EEG study was done with scalp electrodes positioned according to the 10-20 International system of electrode placement. Electrical activity was acquired at a sampling rate of 500Hz  and reviewed with a high frequency filter of 70Hz  and a low frequency filter of 1Hz . EEG data were recorded continuously and digitally stored. Description: No posterior dominant rhythm was seen. There is an excessive amount of 15 to 18 Hz beta activity distributed symmetrically and diffusely.  Physiologic photic driving was not seen during photic stimulation.  Hyperventilation was not performed.   ABNORMALITY -Excessive beta, generalized IMPRESSION: This study is within normal limits. No seizures or epileptiform discharges were seen throughout the recording. The excessive beta activity seen in the background is most likely due to the effect of benzodiazepine and is a benign EEG pattern. Lora Havens   MR BRAIN W WO CONTRAST  Result Date: 09/23/2020 CLINICAL DATA:  Seizure EXAM: MRI HEAD WITHOUT AND WITH CONTRAST TECHNIQUE: Multiplanar, multiecho pulse sequences of the brain and surrounding structures were obtained without and with intravenous contrast. CONTRAST:  76mL GADAVIST GADOBUTROL 1 MMOL/ML IV SOLN COMPARISON:  None. FINDINGS: Motion artifact is present. Brain: There is no acute infarction or intracranial hemorrhage. There is no intracranial mass, mass effect, or edema. There is no hydrocephalus. There are trace bilateral, primarily frontal convexity subdural  collections with dural enhancement. Ventricles and sulci are normal in size and configuration. Small chronic infarct of the central pons. No abnormal parenchymal or leptomeningeal enhancement. Vascular: Major vessel flow voids at the skull base are preserved. Skull and upper cervical spine: Normal marrow signal is preserved. Sinuses/Orbits: Paranasal sinuses are aerated. Orbits are unremarkable. Other: Sella is unremarkable.  Mastoid air cells are clear. IMPRESSION: Trace bilateral, primarily frontal convexity subdural collections without mass effect. Small chronic central pontine infarct. Electronically Signed   By: Macy Mis M.D.   On: 09/23/2020 12:06       Subjective: Patient was seen earlier in the morning.  Overnight events noted.  She appeared improved.  She denies any dizziness and wants to be discharged.  Explained in detail that she needs to be seen by neurology before discharge.  Patient left AGAINST MEDICAL ADVICE later in the evening.  Discharge Exam: Vitals:   09/25/20 0600 09/25/20 1400  BP: 100/78 114/72  Pulse: 100 98  Resp: 16 20  Temp: 97.9 F (36.6 C) 98.4 F (36.9 C)  SpO2: 99% 100%   Vitals:   09/24/20 2005 09/25/20 0114 09/25/20 0600 09/25/20 1400  BP:  104/81 100/78 114/72  Pulse:  89 100 98  Resp:  18 16 20   Temp:  98.7 F (37.1 C) 97.9 F (36.6 C) 98.4 F (36.9 C)  TempSrc:  Oral  Oral  SpO2: 98% 98%  99% 100%  Weight:      Height:        General: Pt is alert, awake, not in acute distress Cardiovascular: RRR, S1/S2 +, no rubs, no gallops Respiratory: CTA bilaterally, no wheezing, no rhonchi Abdominal: Soft, NT, ND, bowel sounds + Extremities: no edema, no cyanosis    The results of significant diagnostics from this hospitalization (including imaging, microbiology, ancillary and laboratory) are listed below for reference.     Microbiology: Recent Results (from the past 240 hour(s))  Respiratory Panel by RT PCR (Flu A&B, Covid) -  Nasopharyngeal Swab     Status: None   Collection Time: 09/22/20  4:18 AM   Specimen: Nasopharyngeal Swab  Result Value Ref Range Status   SARS Coronavirus 2 by RT PCR NEGATIVE NEGATIVE Final    Comment: (NOTE) SARS-CoV-2 target nucleic acids are NOT DETECTED.  The SARS-CoV-2 RNA is generally detectable in upper respiratoy specimens during the acute phase of infection. The lowest concentration of SARS-CoV-2 viral copies this assay can detect is 131 copies/mL. A negative result does not preclude SARS-Cov-2 infection and should not be used as the sole basis for treatment or other patient management decisions. A negative result may occur with  improper specimen collection/handling, submission of specimen other than nasopharyngeal swab, presence of viral mutation(s) within the areas targeted by this assay, and inadequate number of viral copies (<131 copies/mL). A negative result must be combined with clinical observations, patient history, and epidemiological information. The expected result is Negative.  Fact Sheet for Patients:  PinkCheek.be  Fact Sheet for Healthcare Providers:  GravelBags.it  This test is no t yet approved or cleared by the [place] FDA and  has been authorized for detection and/or diagnosis of SARS-CoV-2 by FDA under an Emergency Use Authorization (EUA). This EUA will remain  in effect (meaning this test can be used) for the duration of the COVID-19 declaration under Section 564(b)(1) of the Act, 21 U.S.C. section 360bbb-3(b)(1), unless the authorization is terminated or revoked sooner.     Influenza A by PCR NEGATIVE NEGATIVE Final   Influenza B by PCR NEGATIVE NEGATIVE Final    Comment: (NOTE) The Xpert Xpress SARS-CoV-2/FLU/RSV assay is intended as an aid in  the diagnosis of influenza from Nasopharyngeal swab specimens and  should not be used as a sole basis for treatment. Nasal washings and   aspirates are unacceptable for Xpert Xpress SARS-CoV-2/FLU/RSV  testing.  Fact Sheet for Patients: PinkCheek.be  Fact Sheet for Healthcare Providers: GravelBags.it  This test is not yet approved or cleared by the [place] FDA and  has been authorized for detection and/or diagnosis of SARS-CoV-2 by  FDA under an Emergency Use Authorization (EUA). This EUA will remain  in effect (meaning this test can be used) for the duration of the  Covid-19 declaration under Section 564(b)(1) of the Act, 21  U.S.C. section 360bbb-3(b)(1), unless the authorization is  terminated or revoked. Performed at [facility], [address] [place], [place] [address]      Labs: BNP (last 3 results) No results for input(s): BNP in the last 8760 hours. Basic Metabolic Panel: Recent Labs  Lab 09/22/20 0140 09/22/20 0645 09/23/20 0522 09/24/20 0606 09/25/20 0552  NA 131* 136 137 138 137  K 2.5* 3.0* 2.5* 2.8* 3.7  CL 93* 97* 102 106 105  CO2 24 27 26 25 26   GLUCOSE 115* 81 76 93 81  BUN 5* <5* <5* <5* <5*  CREATININE 0.48 0.49 0.58 0.42* 0.53  CALCIUM 8.5* 8.4* 8.2* 8.5* 9.4  MG 1.2* 2.0  --   --   --    Liver Function Tests: Recent Labs  Lab 09/22/20 0140 09/22/20 0645 09/23/20 0522 09/24/20 0606  AST 207* 182* 127* 121*  ALT 60* 55* 43 43  ALKPHOS 173* 159* 128* 102  BILITOT 3.4* 2.7* 2.2* 1.3*  PROT 6.7 6.1* 5.5* 4.9*  ALBUMIN 3.3* 3.1* 2.8* 2.6*   No results for input(s): LIPASE, AMYLASE in the last 168 hours. No results for input(s): AMMONIA in the last 168 hours. CBC: Recent Labs  Lab 09/22/20 0140 09/22/20 0645  WBC 5.8 6.9  NEUTROABS 4.4 3.8  HGB 13.0 12.5  HCT 35.5* 34.9*  MCV 96.2 97.5  PLT 44* 39*   Cardiac Enzymes: No results for input(s): CKTOTAL, CKMB, CKMBINDEX, TROPONINI in the last 168 hours. BNP: Invalid input(s): POCBNP CBG: No results for input(s): GLUCAP in the last 168 hours. D-Dimer No  results for input(s): DDIMER in the last 72 hours. Hgb A1c No results for input(s): HGBA1C in the last 72 hours. Lipid Profile No results for input(s): CHOL, HDL, LDLCALC, TRIG, CHOLHDL, LDLDIRECT in the last 72 hours. Thyroid function studies No results for input(s): TSH, T4TOTAL, T3FREE, THYROIDAB in the last 72 hours.  Invalid input(s): FREET3 Anemia work up No results for input(s): VITAMINB12, FOLATE, FERRITIN, TIBC, IRON, RETICCTPCT in the last 72 hours. Urinalysis    Component Value Date/Time   COLORURINE STRAW (A) 05/29/2020 0142   APPEARANCEUR CLEAR 05/29/2020 0142   LABSPEC 1.003 (L) 05/29/2020 0142   PHURINE 5.0 05/29/2020 0142   GLUCOSEU NEGATIVE 05/29/2020 0142   HGBUR NEGATIVE 05/29/2020 0142   BILIRUBINUR NEGATIVE 05/29/2020 0142   KETONESUR NEGATIVE 05/29/2020 0142   PROTEINUR NEGATIVE 05/29/2020 0142   NITRITE NEGATIVE 05/29/2020 0142   LEUKOCYTESUR NEGATIVE 05/29/2020 0142   Sepsis Labs Invalid input(s): PROCALCITONIN,  WBC,  LACTICIDVEN Microbiology Recent Results (from the past 240 hour(s))  Respiratory Panel by RT PCR (Flu A&B, Covid) - Nasopharyngeal Swab     Status: None   Collection Time: 09/22/20  4:18 AM   Specimen: Nasopharyngeal Swab  Result Value Ref Range Status   SARS Coronavirus 2 by RT PCR NEGATIVE NEGATIVE Final    Comment: (NOTE) SARS-CoV-2 target nucleic acids are NOT DETECTED.  The SARS-CoV-2 RNA is generally detectable in upper respiratoy specimens during the acute phase of infection. The lowest concentration of SARS-CoV-2 viral copies this assay can detect is 131 copies/mL. A negative result does not preclude SARS-Cov-2 infection and should not be used as the sole basis for treatment or other patient management decisions. A negative result may occur with  improper specimen collection/handling, submission of specimen other than nasopharyngeal swab, presence of viral mutation(s) within the areas targeted by this assay, and inadequate  number of viral copies (<131 copies/mL). A negative result must be combined with clinical observations, patient history, and epidemiological information. The expected result is Negative.  Fact Sheet for Patients:  PinkCheek.be  Fact Sheet for Healthcare Providers:  GravelBags.it  This test is no t yet approved or cleared by the [place] FDA and  has been authorized for detection and/or diagnosis of SARS-CoV-2 by FDA under an Emergency Use Authorization (EUA). This EUA will remain  in effect (meaning this test can be used) for the duration of the COVID-19 declaration under Section 564(b)(1) of the Act, 21 U.S.C. section 360bbb-3(b)(1), unless the authorization is terminated or revoked sooner.     Influenza A by PCR NEGATIVE  NEGATIVE Final   Influenza B by PCR NEGATIVE NEGATIVE Final    Comment: (NOTE) The Xpert Xpress SARS-CoV-2/FLU/RSV assay is intended as an aid in  the diagnosis of influenza from Nasopharyngeal swab specimens and  should not be used as a sole basis for treatment. Nasal washings and  aspirates are unacceptable for Xpert Xpress SARS-CoV-2/FLU/RSV  testing.  Fact Sheet for Patients: PinkCheek.be  Fact Sheet for Healthcare Providers: GravelBags.it  This test is not yet approved or cleared by the [place] FDA and  has been authorized for detection and/or diagnosis of SARS-CoV-2 by  FDA under an Emergency Use Authorization (EUA). This EUA will remain  in effect (meaning this test can be used) for the duration of the  Covid-19 declaration under Section 564(b)(1) of the Act, 21  U.S.C. section 360bbb-3(b)(1), unless the authorization is  terminated or revoked. Performed at [facility], [address] [place], [place] [address]      Time coordinating discharge:  Left against medical advice.  SIGNED:   Shawna Clamp, MD  [facility] 09/26/2020, 1:27 PM Pager   If 7PM-7AM, please contact night-coverage [email or web address]

## 2020-12-17 IMAGING — DX DG CHEST 2V
2 series · 2 of 2 positions shown · non-contrast
Comparison: None.

CLINICAL DATA: Chest pain.  Recent fall

EXAM:
CHEST - 2 VIEW

[chest pa]
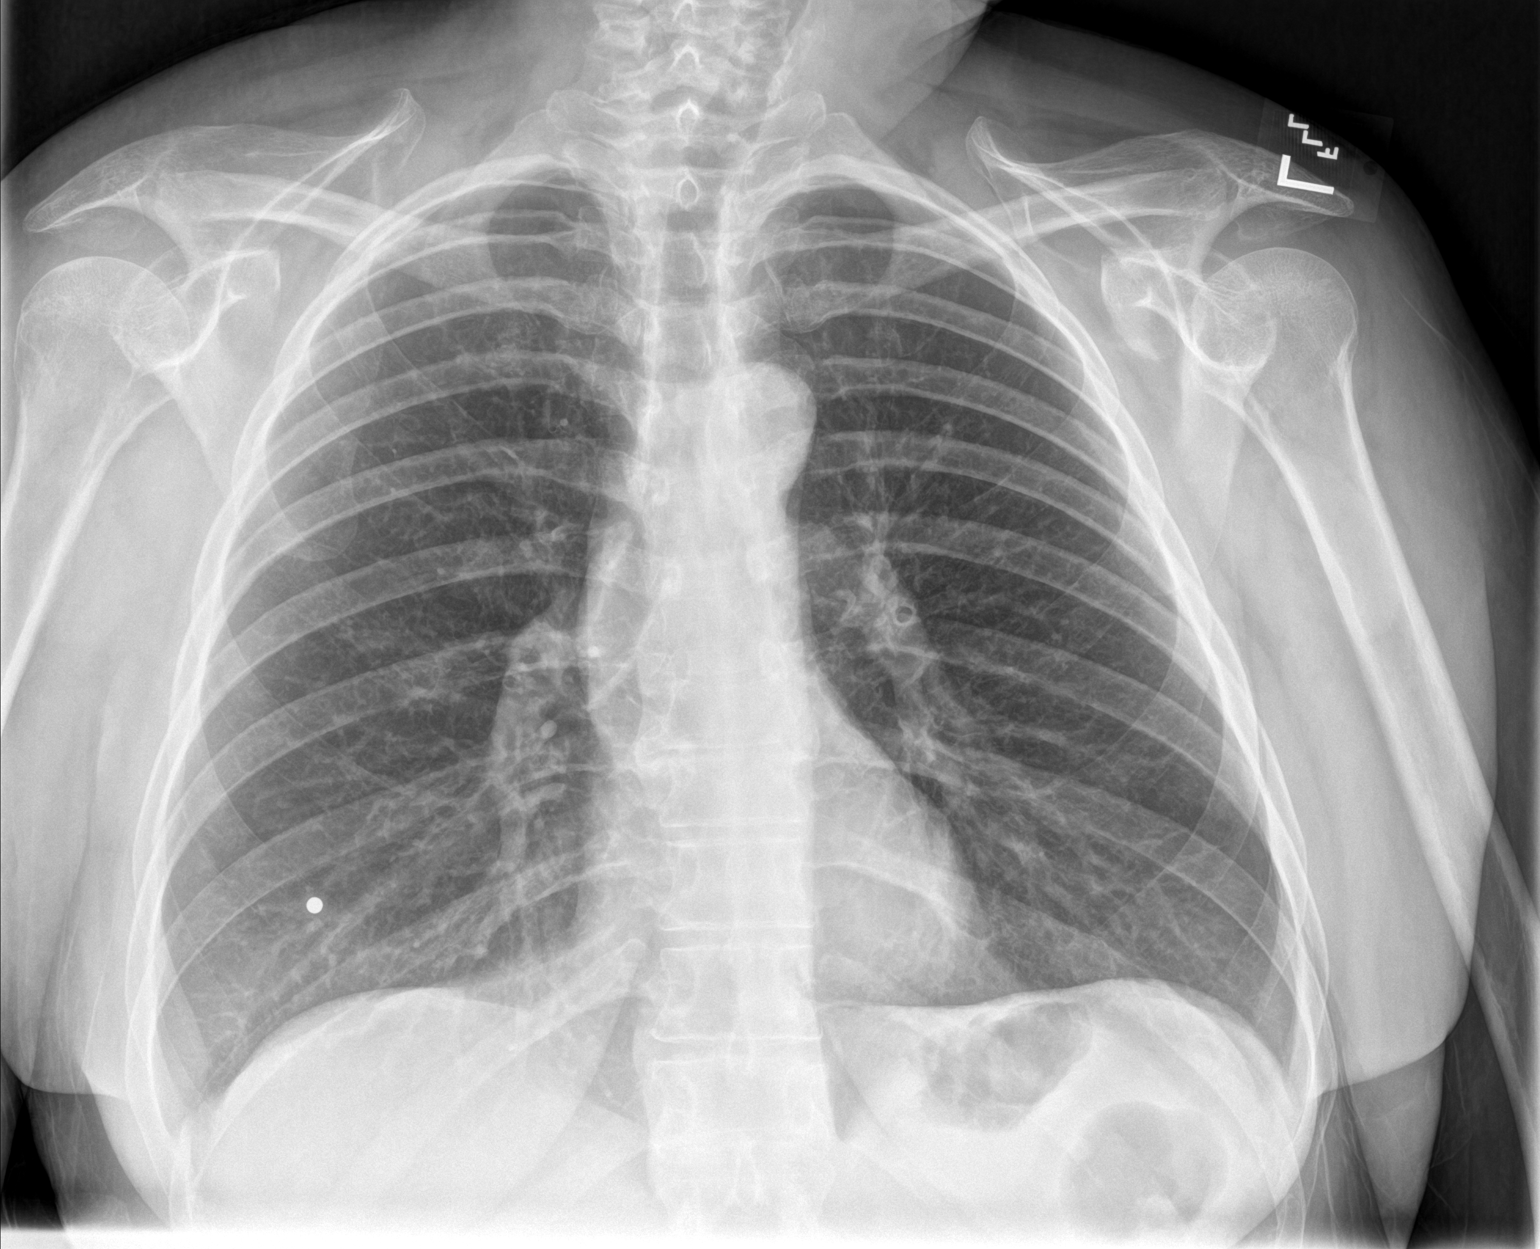

[chest lat]
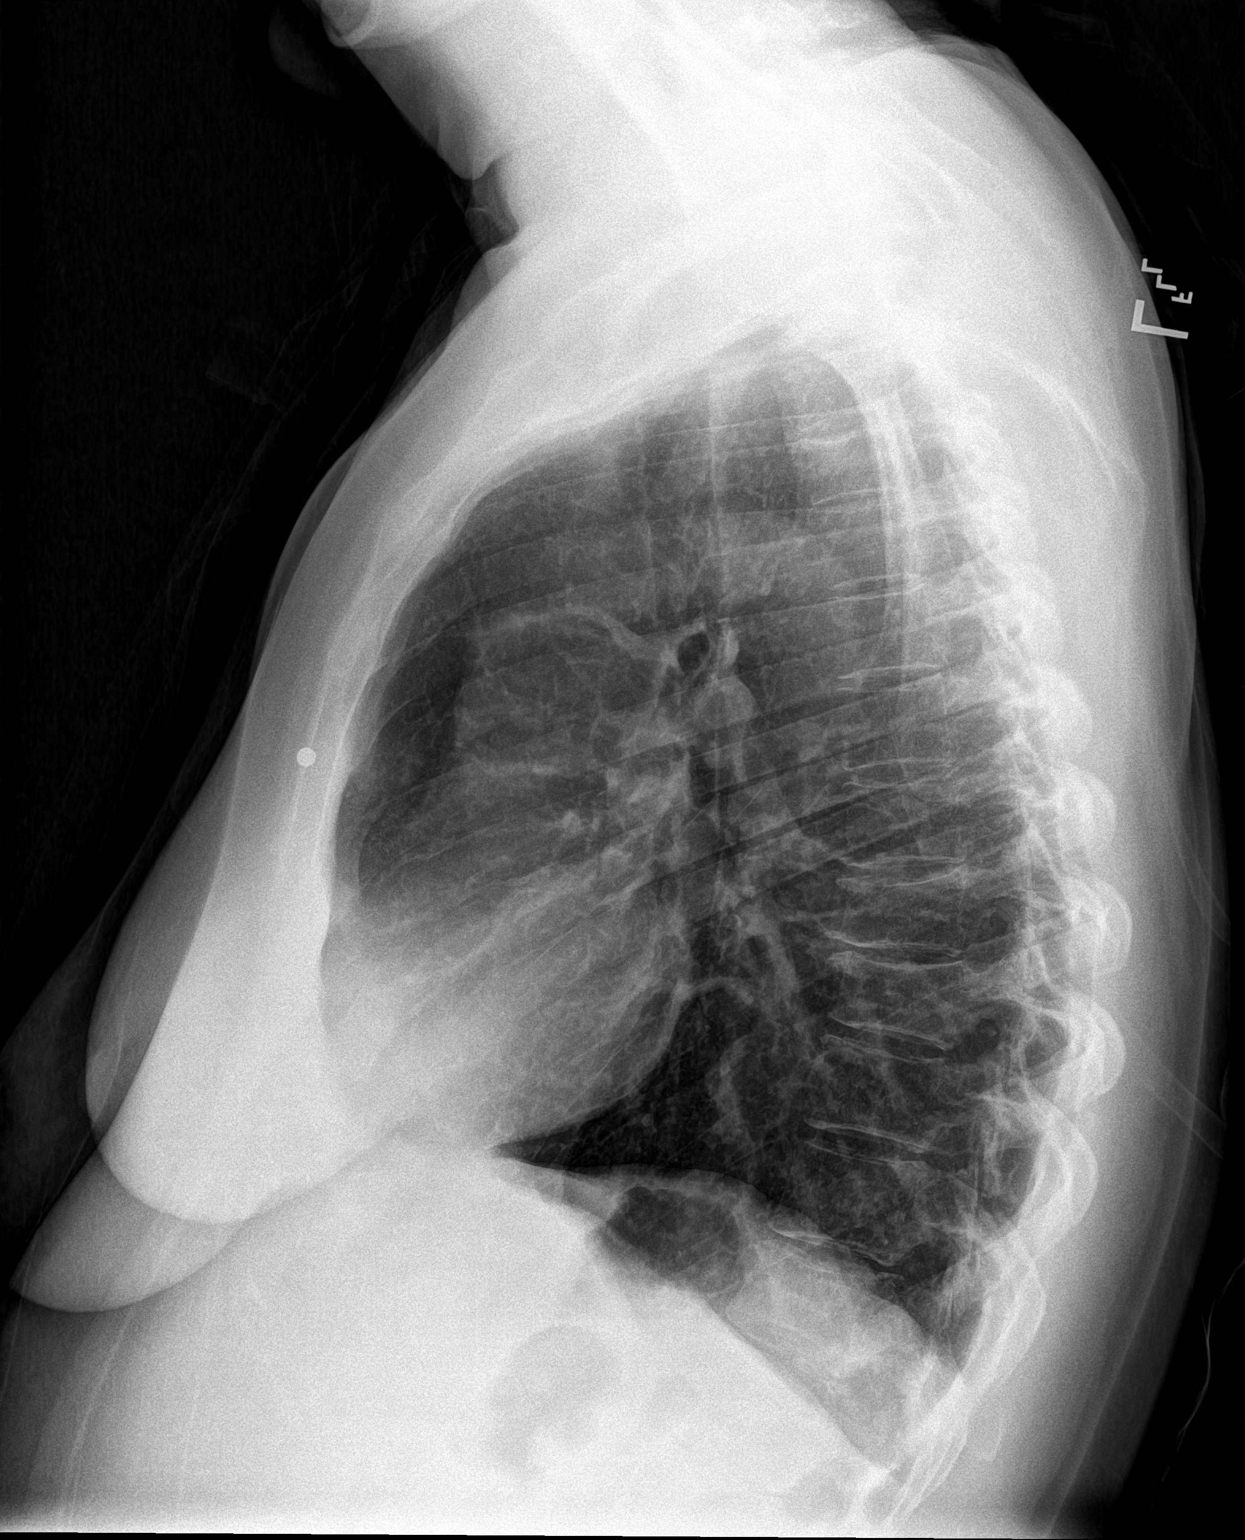

[2 of 2 positions shown; findings below may reference images not displayed]

FINDINGS: Lungs are clear. Heart size and pulmonary vascularity are normal. No
adenopathy. Small metallic foreign body in the anterior right chest
wall. No bone lesions. No pneumothorax.
IMPRESSION: No edema or consolidation. No pneumothorax. Cardiac silhouette
within normal limits.

## 2021-09-16 IMAGING — MR MR HEAD WO/W CM
14 of 16 series · 36 of 48 positions shown · IV contrast (gadavist)
Comparison: None.

CLINICAL DATA: Seizure

EXAM:
MRI HEAD WITHOUT AND WITH CONTRAST
TECHNIQUE: Multiplanar, multiecho pulse sequences of the brain and surrounding
structures were obtained without and with intravenous contrast.
CONTRAST:  5mL GADAVIST GADOBUTROL 1 MMOL/ML IV SOLN

[Series 5: DWI · axial · 4.0mm · 0.88mm/px · z∈[-69,+69]mm · 3 of 36 slices shown (1 of 6)]
[im 1/36]
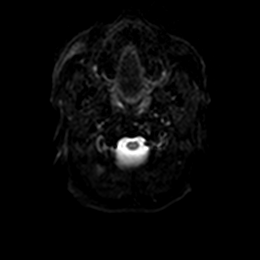
[im 18/36]
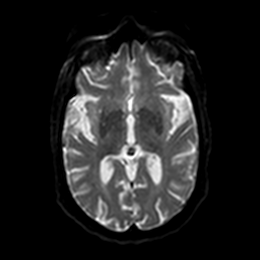
[im 36/36]
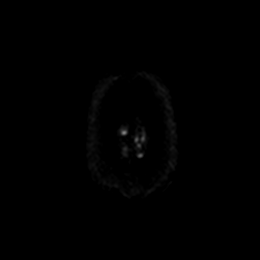

[Series 5: DWI · axial · 4.0mm · 0.88mm/px · z∈[-69,+69]mm · 3 of 36 slices shown (2 of 6)]
[im 1/36]
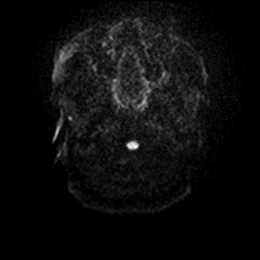
[im 18/36]
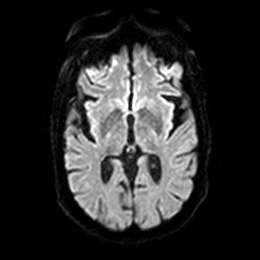
[im 36/36]
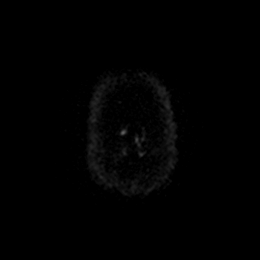

[Series 6: DWI · axial · 4.0mm · 0.88mm/px · z∈[-69,+69]mm · 3 of 36 slices shown (3 of 6)]
[im 1/36]
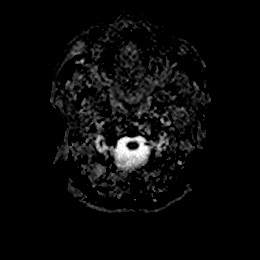
[im 18/36]
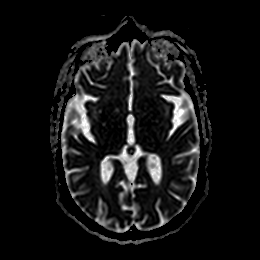
[im 36/36]
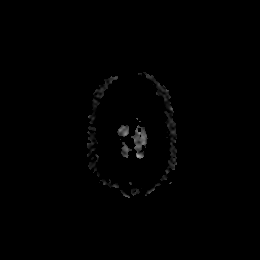

[Series 7: DWI · coronal · 4.0mm · 0.88mm/px · 3 of 32 slices shown (4 of 6)]
[im 1/32]
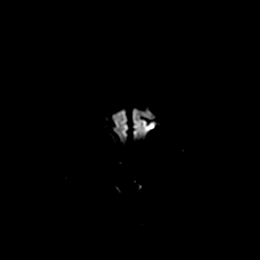
[im 16/32]
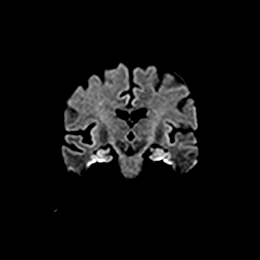
[im 32/32]
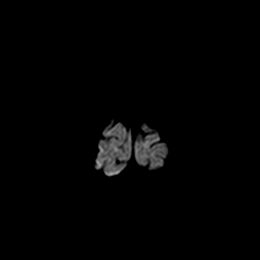

[Series 7: DWI · coronal · 4.0mm · 0.88mm/px · 3 of 32 slices shown (5 of 6)]
[im 1/32]
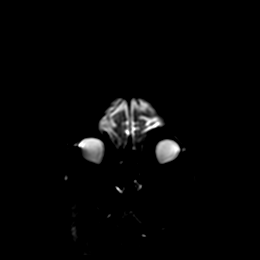
[im 16/32]
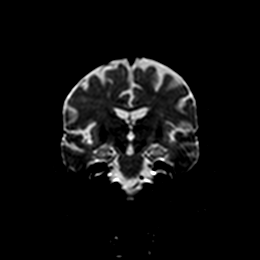
[im 32/32]
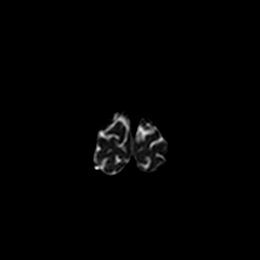

[Series 8: DWI · coronal · 4.0mm · 0.88mm/px · 3 of 32 slices shown (6 of 6)]
[im 1/32]
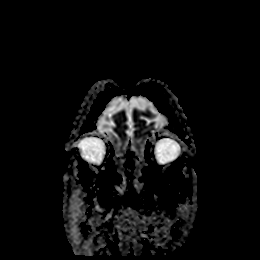
[im 16/32]
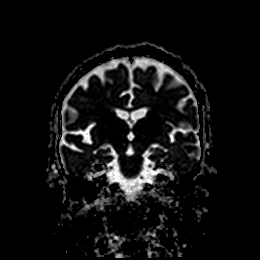
[im 32/32]
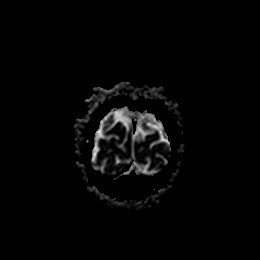

[Series 9: T1 · sagittal · 5.0mm · 0.94mm/px · 2 of 17 slices shown]
[im 1/17]
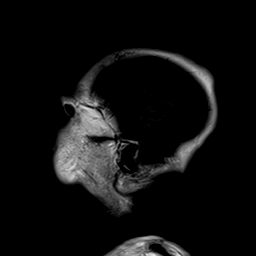
[im 17/17]
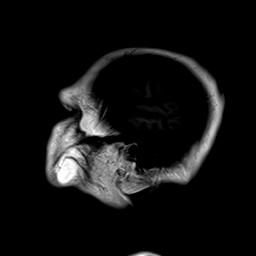

[Series 10: T2 · axial · 5.0mm · 0.72mm/px · z∈[-65,+65]mm · 2 of 20 slices shown (1 of 3)]
[im 1/20]
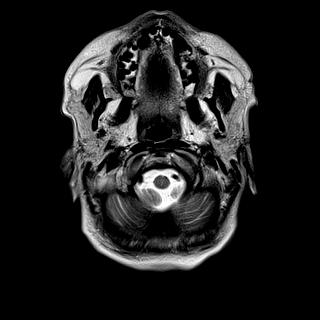
[im 20/20]
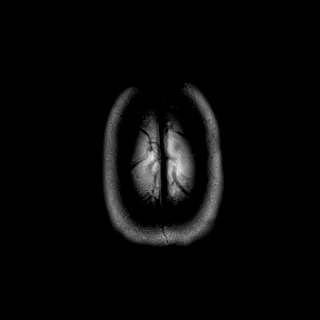

[Series 11: ax hemo · axial · 5.0mm · 0.86mm/px · z∈[-72,+69]mm · 2 of 25 slices shown]
[im 1/25]
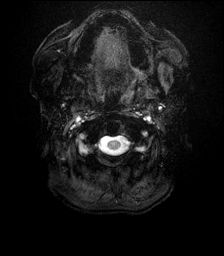
[im 25/25]
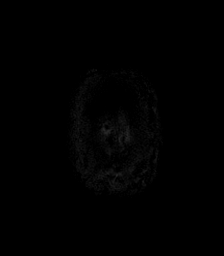

[Series 12: FLAIR · axial · 4.0mm · 0.43mm/px · z∈[-62,+60]mm · 3 of 32 slices shown (1 of 2)]
[im 1/32]
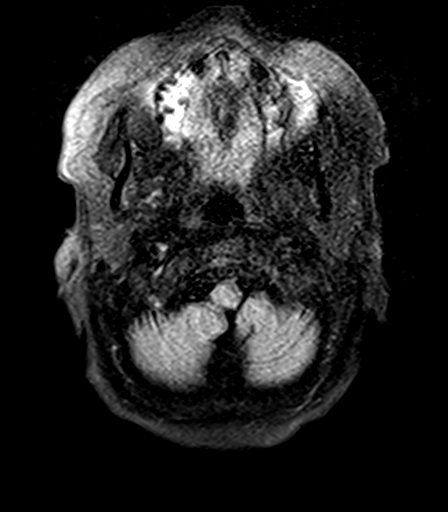
[im 16/32]
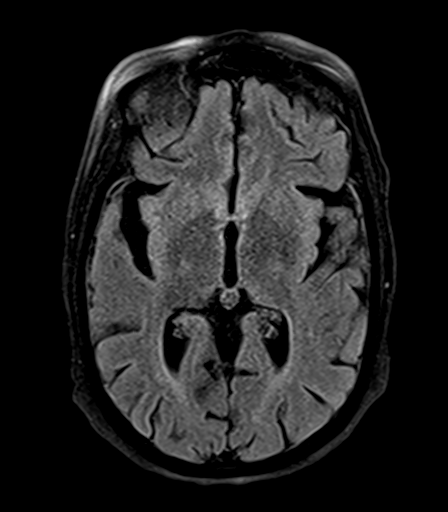
[im 32/32]
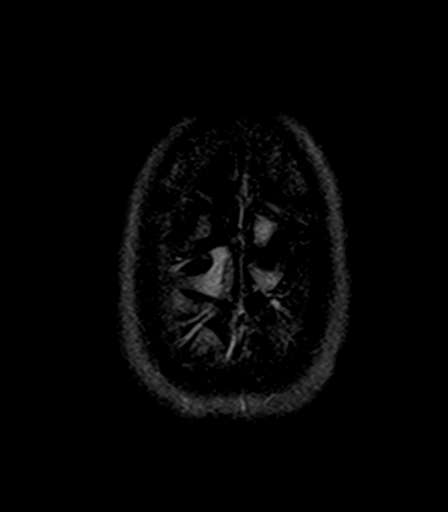

[Series 14: FLAIR · coronal · 3.0mm · 0.56mm/px · 2 of 21 slices shown (2 of 2)]
[im 1/21]
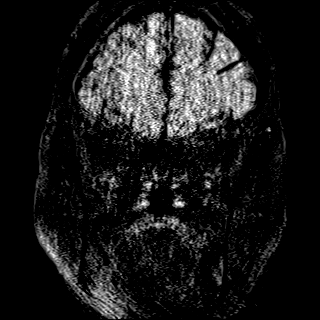
[im 21/21]
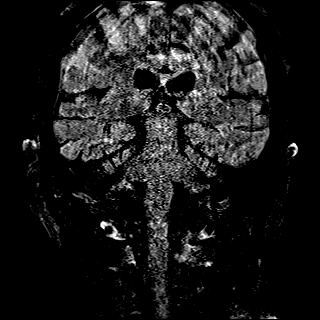

[Series 15: T2 · coronal · 3.0mm · 0.27mm/px · 3 of 32 slices shown (2 of 3)]
[im 1/32]
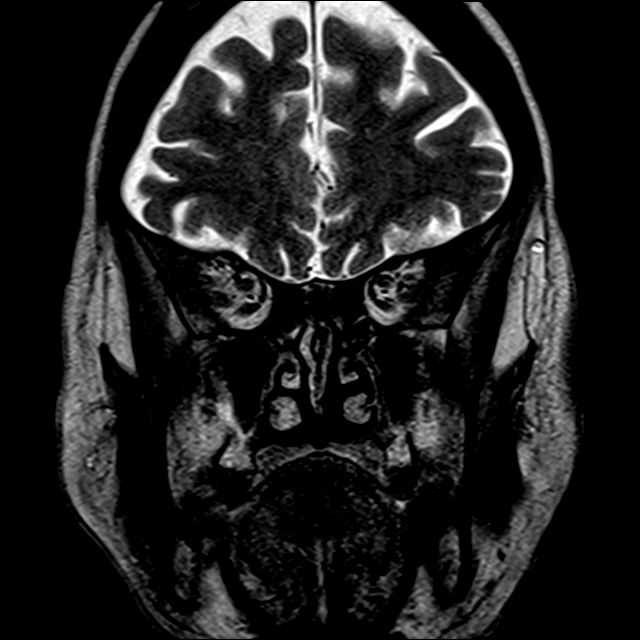
[im 16/32]
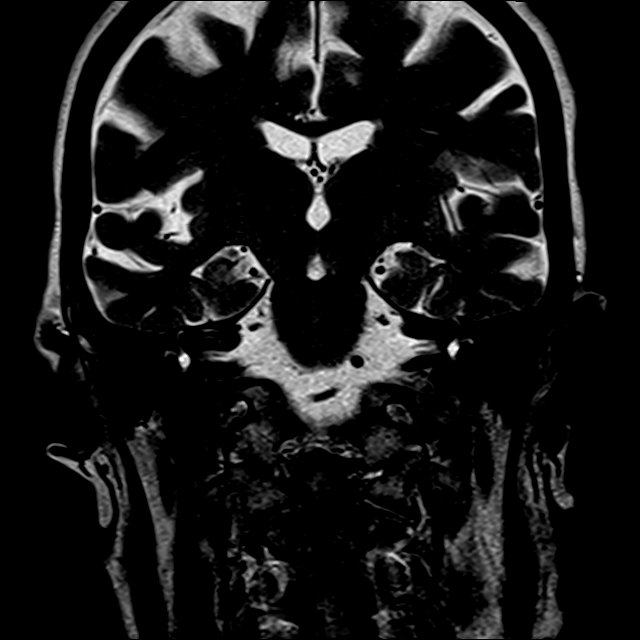
[im 32/32]
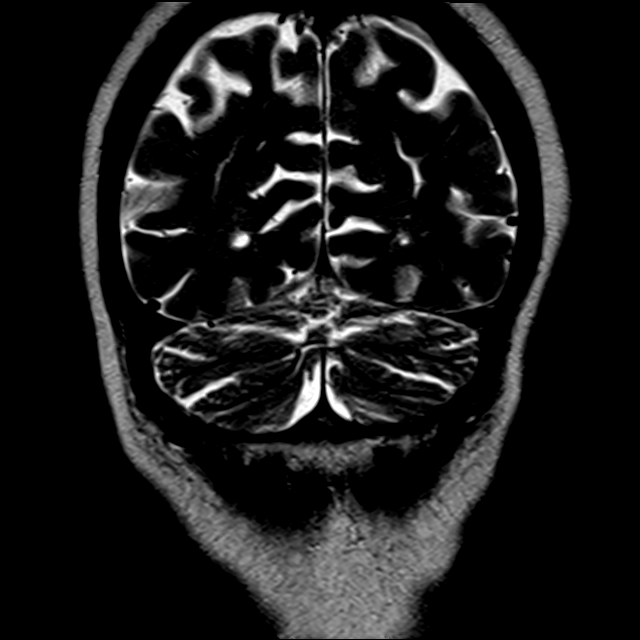

[Series 16: T2 · coronal · 5.0mm · 0.72mm/px · 2 of 26 slices shown (3 of 3)]
[im 1/26]
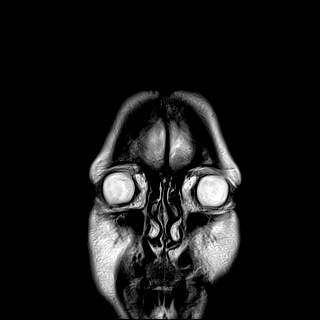
[im 26/26]
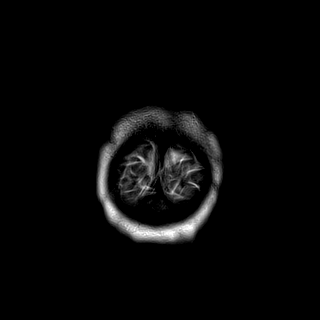

[Series 17: T1 post-contrast · coronal · 5.0mm · 0.34mm/px · 2 of 26 slices shown]
[im 1/26]
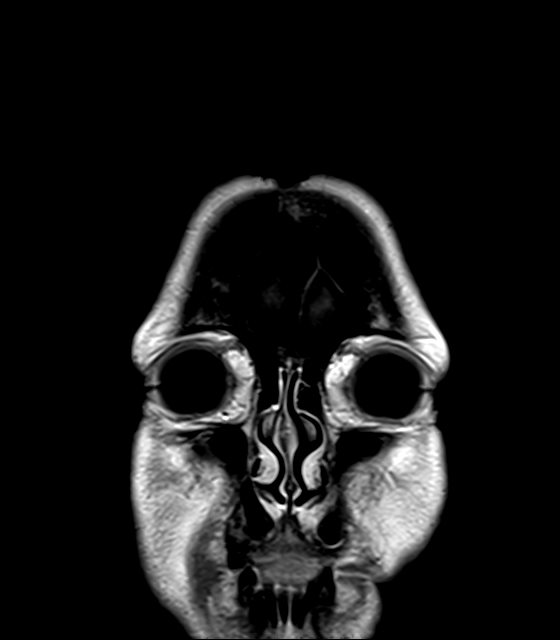
[im 26/26]
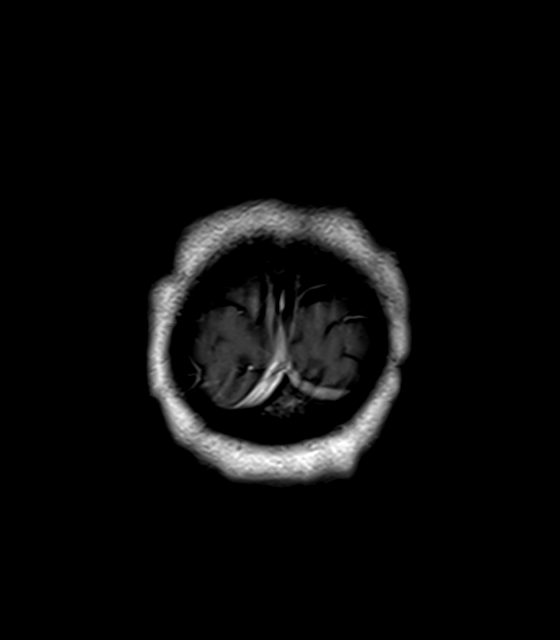

[36 of 48 positions shown; findings below may reference images not displayed]

FINDINGS: Motion artifact is present.

Brain: There is no acute infarction or intracranial hemorrhage.
There is no intracranial mass, mass effect, or edema. There is no
hydrocephalus. There are trace bilateral, primarily frontal
convexity subdural collections with dural enhancement. Ventricles
and sulci are normal in size and configuration. Small chronic
infarct of the central pons. No abnormal parenchymal or
leptomeningeal enhancement.

Vascular: Major vessel flow voids at the skull base are preserved.

Skull and upper cervical spine: Normal marrow signal is preserved.

Sinuses/Orbits: Paranasal sinuses are aerated. Orbits are
unremarkable.

Other: Sella is unremarkable.  Mastoid air cells are clear.
IMPRESSION: Trace bilateral, primarily frontal convexity subdural collections
without mass effect.

Small chronic central pontine infarct.

## 2021-09-29 ENCOUNTER — Emergency Department (HOSPITAL_COMMUNITY): Payer: Self-pay

## 2021-09-29 ENCOUNTER — Inpatient Hospital Stay (HOSPITAL_COMMUNITY)
Admission: EM | Admit: 2021-09-29 | Discharge: 2021-10-14 | DRG: 551 | Disposition: A | Discharge status: Home / Self Care | Payer: Self-pay | Attending: Internal Medicine | Admitting: Internal Medicine

## 2021-09-29 DIAGNOSIS — D709 Neutropenia, unspecified: Secondary | ICD-10-CM | POA: Diagnosis present

## 2021-09-29 DIAGNOSIS — E871 Hypo-osmolality and hyponatremia: Secondary | ICD-10-CM | POA: Diagnosis not present

## 2021-09-29 DIAGNOSIS — S32029A Unspecified fracture of second lumbar vertebra, initial encounter for closed fracture: Secondary | ICD-10-CM | POA: Diagnosis present

## 2021-09-29 DIAGNOSIS — S24101A Unspecified injury at T1 level of thoracic spinal cord, initial encounter: Secondary | ICD-10-CM | POA: Diagnosis present

## 2021-09-29 DIAGNOSIS — Z880 Allergy status to penicillin: Secondary | ICD-10-CM

## 2021-09-29 DIAGNOSIS — S32019A Unspecified fracture of first lumbar vertebra, initial encounter for closed fracture: Secondary | ICD-10-CM | POA: Diagnosis present

## 2021-09-29 DIAGNOSIS — D75839 Thrombocytosis, unspecified: Secondary | ICD-10-CM

## 2021-09-29 DIAGNOSIS — R569 Unspecified convulsions: Secondary | ICD-10-CM | POA: Diagnosis present

## 2021-09-29 DIAGNOSIS — Y92410 Unspecified street and highway as the place of occurrence of the external cause: Secondary | ICD-10-CM

## 2021-09-29 DIAGNOSIS — S60511A Abrasion of right hand, initial encounter: Secondary | ICD-10-CM | POA: Diagnosis present

## 2021-09-29 DIAGNOSIS — S22021A Stable burst fracture of second thoracic vertebra, initial encounter for closed fracture: Secondary | ICD-10-CM | POA: Diagnosis present

## 2021-09-29 DIAGNOSIS — Z20822 Contact with and (suspected) exposure to covid-19: Secondary | ICD-10-CM | POA: Diagnosis present

## 2021-09-29 DIAGNOSIS — S22011A Stable burst fracture of first thoracic vertebra, initial encounter for closed fracture: Secondary | ICD-10-CM | POA: Diagnosis present

## 2021-09-29 DIAGNOSIS — G928 Other toxic encephalopathy: Secondary | ICD-10-CM | POA: Diagnosis not present

## 2021-09-29 DIAGNOSIS — R7989 Other specified abnormal findings of blood chemistry: Secondary | ICD-10-CM

## 2021-09-29 DIAGNOSIS — Z833 Family history of diabetes mellitus: Secondary | ICD-10-CM

## 2021-09-29 DIAGNOSIS — D62 Acute posthemorrhagic anemia: Secondary | ICD-10-CM

## 2021-09-29 DIAGNOSIS — Z85820 Personal history of malignant melanoma of skin: Secondary | ICD-10-CM

## 2021-09-29 DIAGNOSIS — S01512A Laceration without foreign body of oral cavity, initial encounter: Secondary | ICD-10-CM | POA: Diagnosis present

## 2021-09-29 DIAGNOSIS — K760 Fatty (change of) liver, not elsewhere classified: Secondary | ICD-10-CM | POA: Diagnosis present

## 2021-09-29 DIAGNOSIS — E43 Unspecified severe protein-calorie malnutrition: Secondary | ICD-10-CM | POA: Diagnosis present

## 2021-09-29 DIAGNOSIS — S5001XA Contusion of right elbow, initial encounter: Secondary | ICD-10-CM | POA: Diagnosis present

## 2021-09-29 DIAGNOSIS — S12601A Unspecified nondisplaced fracture of seventh cervical vertebra, initial encounter for closed fracture: Secondary | ICD-10-CM | POA: Diagnosis present

## 2021-09-29 DIAGNOSIS — I38 Endocarditis, valve unspecified: Secondary | ICD-10-CM | POA: Diagnosis present

## 2021-09-29 DIAGNOSIS — A4101 Sepsis due to Methicillin susceptible Staphylococcus aureus: Secondary | ICD-10-CM | POA: Diagnosis not present

## 2021-09-29 DIAGNOSIS — L8990 Pressure ulcer of unspecified site, unspecified stage: Secondary | ICD-10-CM | POA: Insufficient documentation

## 2021-09-29 DIAGNOSIS — I959 Hypotension, unspecified: Secondary | ICD-10-CM | POA: Diagnosis not present

## 2021-09-29 DIAGNOSIS — K7200 Acute and subacute hepatic failure without coma: Secondary | ICD-10-CM | POA: Diagnosis not present

## 2021-09-29 DIAGNOSIS — S32039A Unspecified fracture of third lumbar vertebra, initial encounter for closed fracture: Secondary | ICD-10-CM | POA: Diagnosis present

## 2021-09-29 DIAGNOSIS — S0083XA Contusion of other part of head, initial encounter: Secondary | ICD-10-CM | POA: Diagnosis present

## 2021-09-29 DIAGNOSIS — Z781 Physical restraint status: Secondary | ICD-10-CM

## 2021-09-29 DIAGNOSIS — E876 Hypokalemia: Secondary | ICD-10-CM | POA: Diagnosis not present

## 2021-09-29 DIAGNOSIS — G4700 Insomnia, unspecified: Secondary | ICD-10-CM | POA: Diagnosis present

## 2021-09-29 DIAGNOSIS — M8448XA Pathological fracture, other site, initial encounter for fracture: Secondary | ICD-10-CM

## 2021-09-29 DIAGNOSIS — R6520 Severe sepsis without septic shock: Secondary | ICD-10-CM

## 2021-09-29 DIAGNOSIS — Z8249 Family history of ischemic heart disease and other diseases of the circulatory system: Secondary | ICD-10-CM

## 2021-09-29 DIAGNOSIS — F10231 Alcohol dependence with withdrawal delirium: Secondary | ICD-10-CM | POA: Diagnosis not present

## 2021-09-29 DIAGNOSIS — L89311 Pressure ulcer of right buttock, stage 1: Secondary | ICD-10-CM | POA: Diagnosis not present

## 2021-09-29 DIAGNOSIS — E8809 Other disorders of plasma-protein metabolism, not elsewhere classified: Secondary | ICD-10-CM | POA: Diagnosis present

## 2021-09-29 DIAGNOSIS — Z23 Encounter for immunization: Secondary | ICD-10-CM | POA: Diagnosis not present

## 2021-09-29 DIAGNOSIS — M4846XA Fatigue fracture of vertebra, lumbar region, initial encounter for fracture: Secondary | ICD-10-CM

## 2021-09-29 DIAGNOSIS — D6959 Other secondary thrombocytopenia: Secondary | ICD-10-CM | POA: Diagnosis present

## 2021-09-29 DIAGNOSIS — E860 Dehydration: Secondary | ICD-10-CM | POA: Diagnosis not present

## 2021-09-29 DIAGNOSIS — R509 Fever, unspecified: Secondary | ICD-10-CM

## 2021-09-29 DIAGNOSIS — T07XXXA Unspecified multiple injuries, initial encounter: Secondary | ICD-10-CM

## 2021-09-29 DIAGNOSIS — T1490XA Injury, unspecified, initial encounter: Secondary | ICD-10-CM

## 2021-09-29 DIAGNOSIS — K7010 Alcoholic hepatitis without ascites: Secondary | ICD-10-CM | POA: Diagnosis present

## 2021-09-29 DIAGNOSIS — L89326 Pressure-induced deep tissue damage of left buttock: Secondary | ICD-10-CM | POA: Diagnosis not present

## 2021-09-29 DIAGNOSIS — S12690A Other displaced fracture of seventh cervical vertebra, initial encounter for closed fracture: Secondary | ICD-10-CM

## 2021-09-29 DIAGNOSIS — F17210 Nicotine dependence, cigarettes, uncomplicated: Secondary | ICD-10-CM | POA: Diagnosis present

## 2021-09-29 DIAGNOSIS — R7881 Bacteremia: Secondary | ICD-10-CM

## 2021-09-29 DIAGNOSIS — E559 Vitamin D deficiency, unspecified: Secondary | ICD-10-CM | POA: Diagnosis present

## 2021-09-29 DIAGNOSIS — M4850XA Collapsed vertebra, not elsewhere classified, site unspecified, initial encounter for fracture: Secondary | ICD-10-CM | POA: Diagnosis present

## 2021-09-29 DIAGNOSIS — Z6821 Body mass index (BMI) 21.0-21.9, adult: Secondary | ICD-10-CM

## 2021-09-29 HISTORY — DX: Alcohol abuse, uncomplicated: F10.10

## 2021-09-29 HISTORY — DX: Fatty (change of) liver, not elsewhere classified: K76.0

## 2021-09-29 LAB — URINALYSIS, ROUTINE W REFLEX MICROSCOPIC
Bacteria, UA: NONE SEEN
Bilirubin Urine: NEGATIVE
Glucose, UA: NEGATIVE mg/dL
Hgb urine dipstick: NEGATIVE
Ketones, ur: 5 mg/dL — AB
Leukocytes,Ua: NEGATIVE
Nitrite: NEGATIVE
Protein, ur: 30 mg/dL — AB
Specific Gravity, Urine: >1.046 — ABNORMAL HIGH (ref 1.005–1.030)
pH: 8 (ref 5.0–8.0)

## 2021-09-29 LAB — COMPREHENSIVE METABOLIC PANEL
ALT: 89 U/L — ABNORMAL HIGH (ref 0–44)
AST: 151 U/L — ABNORMAL HIGH (ref 15–41)
Albumin: 3.7 g/dL (ref 3.5–5.0)
Alkaline Phosphatase: 138 U/L — ABNORMAL HIGH (ref 38–126)
Anion gap: 13 (ref 5–15)
BUN: 5 mg/dL — ABNORMAL LOW (ref 6–20)
CO2: 24 mmol/L (ref 22–32)
Calcium: 9.4 mg/dL (ref 8.9–10.3)
Chloride: 94 mmol/L — ABNORMAL LOW (ref 98–111)
Creatinine, Ser: 0.6 mg/dL (ref 0.44–1.00)
GFR, Estimated: >60 mL/min (ref >60)
Glucose, Bld: 111 mg/dL — ABNORMAL HIGH (ref 70–99)
Potassium: 3 mmol/L — ABNORMAL LOW (ref 3.5–5.1)
Sodium: 131 mmol/L — ABNORMAL LOW (ref 135–145)
Total Bilirubin: 1.3 mg/dL — ABNORMAL HIGH (ref 0.3–1.2)
Total Protein: 6.6 g/dL (ref 6.5–8.1)

## 2021-09-29 LAB — VITAMIN D 25 HYDROXY (VIT D DEFICIENCY, FRACTURES): Vit D, 25-Hydroxy: 27.4 ng/mL — ABNORMAL LOW (ref 30–100)

## 2021-09-29 LAB — CBC
HCT: 43.5 % (ref 36.0–46.0)
Hemoglobin: 15.4 g/dL — ABNORMAL HIGH (ref 12.0–15.0)
MCH: 34.5 pg — ABNORMAL HIGH (ref 26.0–34.0)
MCHC: 35.4 g/dL (ref 30.0–36.0)
MCV: 97.3 fL (ref 80.0–100.0)
Platelets: 40 10*3/uL — ABNORMAL LOW (ref 150–400)
RBC: 4.47 MIL/uL (ref 3.87–5.11)
RDW: 15.1 % (ref 11.5–15.5)
WBC: 6.2 10*3/uL (ref 4.0–10.5)
nRBC: 0 % (ref 0.0–0.2)

## 2021-09-29 LAB — RAPID URINE DRUG SCREEN, HOSP PERFORMED
Amphetamines: NOT DETECTED
Barbiturates: NOT DETECTED
Benzodiazepines: NOT DETECTED
Cocaine: NOT DETECTED
Opiates: POSITIVE — AB
Tetrahydrocannabinol: NOT DETECTED

## 2021-09-29 LAB — I-STAT CHEM 8, ED
BUN: 4 mg/dL — ABNORMAL LOW (ref 6–20)
Calcium, Ion: 1.01 mmol/L — ABNORMAL LOW (ref 1.15–1.40)
Chloride: 93 mmol/L — ABNORMAL LOW (ref 98–111)
Creatinine, Ser: 0.4 mg/dL — ABNORMAL LOW (ref 0.44–1.00)
Glucose, Bld: 194 mg/dL — ABNORMAL HIGH (ref 70–99)
HCT: 48 % — ABNORMAL HIGH (ref 36.0–46.0)
Hemoglobin: 16.3 g/dL — ABNORMAL HIGH (ref 12.0–15.0)
Potassium: 2.4 mmol/L — CL (ref 3.5–5.1)
Sodium: 131 mmol/L — ABNORMAL LOW (ref 135–145)
TCO2: 21 mmol/L — ABNORMAL LOW (ref 22–32)

## 2021-09-29 LAB — SAMPLE TO BLOOD BANK

## 2021-09-29 LAB — PROTIME-INR
INR: 1 (ref 0.8–1.2)
INR: 1 (ref 0.8–1.2)
Prothrombin Time: 12.8 seconds (ref 11.4–15.2)
Prothrombin Time: 12.8 seconds (ref 11.4–15.2)

## 2021-09-29 LAB — I-STAT BETA HCG BLOOD, ED (MC, WL, AP ONLY): I-stat hCG, quantitative: <5 m[IU]/mL (ref <5)

## 2021-09-29 LAB — RESP PANEL BY RT-PCR (FLU A&B, COVID) ARPGX2
Influenza A by PCR: NEGATIVE
Influenza B by PCR: NEGATIVE
SARS Coronavirus 2 by RT PCR: NEGATIVE

## 2021-09-29 LAB — MAGNESIUM: Magnesium: 1.4 mg/dL — ABNORMAL LOW (ref 1.7–2.4)

## 2021-09-29 LAB — NA AND K (SODIUM & POTASSIUM), RAND UR
Potassium Urine: 51 mmol/L
Sodium, Ur: 71 mmol/L

## 2021-09-29 LAB — LACTIC ACID, PLASMA
Lactic Acid, Venous: 4.6 mmol/L (ref 0.5–1.9)
Lactic Acid, Venous: 1.1 mmol/L (ref 0.5–1.9)

## 2021-09-29 LAB — CREATININE, URINE, RANDOM: Creatinine, Urine: 68.16 mg/dL

## 2021-09-29 LAB — ETHANOL: Alcohol, Ethyl (B): <10 mg/dL (ref <10)

## 2021-09-29 LAB — PHOSPHORUS: Phosphorus: 3.4 mg/dL (ref 2.5–4.6)

## 2021-09-29 MED ORDER — MAGNESIUM SULFATE 4 GM/100ML IV SOLN
4.0000 g | Freq: Once | INTRAVENOUS | Status: AC
  Administered 2021-09-29: 4 g via INTRAVENOUS
  Filled 2021-09-29: qty 100

## 2021-09-29 MED ORDER — POTASSIUM CHLORIDE 10 MEQ/100ML IV SOLN
10.0000 meq | INTRAVENOUS | Status: DC
Start: 2021-09-29 — End: 2021-09-29
  Administered 2021-09-29 (×2): 10 meq via INTRAVENOUS
  Filled 2021-09-29 (×2): qty 100

## 2021-09-29 MED ORDER — LORAZEPAM 1 MG PO TABS
1.0000 mg | ORAL_TABLET | ORAL | Status: DC | PRN
  Administered 2021-09-30: 1 mg via ORAL
  Filled 2021-09-29: qty 1

## 2021-09-29 MED ORDER — HYDROMORPHONE HCL 1 MG/ML IJ SOLN
1.0000 mg | INTRAMUSCULAR | Status: DC | PRN
Start: 2021-09-29 — End: 2021-09-30
  Administered 2021-09-29 – 2021-09-30 (×3): 1 mg via INTRAVENOUS
  Filled 2021-09-29 (×3): qty 1

## 2021-09-29 MED ORDER — LACTATED RINGERS IV BOLUS
1000.0000 mL | Freq: Once | INTRAVENOUS | Status: AC
  Administered 2021-09-29: 1000 mL via INTRAVENOUS

## 2021-09-29 MED ORDER — ADULT MULTIVITAMIN W/MINERALS CH: 1.0000 | ORAL_TABLET | Freq: Every day | ORAL | Status: DC

## 2021-09-29 MED ORDER — POTASSIUM CHLORIDE 2 MEQ/ML IV SOLN
INTRAVENOUS | Status: DC
  Filled 2021-09-29: qty 500

## 2021-09-29 MED ORDER — HYDROMORPHONE HCL 1 MG/ML IJ SOLN: 1.0000 mg | INTRAMUSCULAR | Status: DC | PRN

## 2021-09-29 MED ORDER — ALBUTEROL SULFATE (2.5 MG/3ML) 0.083% IN NEBU: 2.5000 mg | INHALATION_SOLUTION | RESPIRATORY_TRACT | Status: DC | PRN

## 2021-09-29 MED ORDER — FOLIC ACID 1 MG PO TABS: 1.0000 mg | ORAL_TABLET | Freq: Every day | ORAL | Status: DC

## 2021-09-29 MED ORDER — LACTATED RINGERS IV SOLN: INTRAVENOUS | Status: DC

## 2021-09-29 MED ORDER — OXYCODONE HCL 5 MG PO TABS
5.0000 mg | ORAL_TABLET | ORAL | Status: DC | PRN
Start: 2021-09-29 — End: 2021-09-30

## 2021-09-29 MED ORDER — ONDANSETRON HCL 4 MG/2ML IJ SOLN
4.0000 mg | Freq: Once | INTRAMUSCULAR | Status: AC
  Administered 2021-09-29: 4 mg via INTRAVENOUS
  Filled 2021-09-29: qty 2

## 2021-09-29 MED ORDER — IOHEXOL 300 MG/ML  SOLN
75.0000 mL | Freq: Once | INTRAMUSCULAR | Status: AC | PRN
  Administered 2021-09-29: 75 mL via INTRAVENOUS

## 2021-09-29 MED ORDER — POTASSIUM CHLORIDE 10 MEQ/100ML IV SOLN
10.0000 meq | Freq: Once | INTRAVENOUS | Status: AC
  Administered 2021-09-29: 10 meq via INTRAVENOUS
  Filled 2021-09-29: qty 100

## 2021-09-29 MED ORDER — THIAMINE HCL 100 MG/ML IJ SOLN: 100.0000 mg | Freq: Every day | INTRAMUSCULAR | Status: DC

## 2021-09-29 MED ORDER — POTASSIUM CHLORIDE 20 MEQ PO PACK
60.0000 meq | PACK | Freq: Once | ORAL | Status: AC
  Administered 2021-09-29: 60 meq via ORAL
  Filled 2021-09-29: qty 3

## 2021-09-29 MED ORDER — HYDROMORPHONE HCL 1 MG/ML IJ SOLN: 1.0000 mg | Freq: Once | INTRAMUSCULAR | Status: DC

## 2021-09-29 MED ORDER — THIAMINE HCL 100 MG PO TABS
100.0000 mg | ORAL_TABLET | Freq: Every day | ORAL | Status: DC
  Administered 2021-09-29 – 2021-10-14 (×16): 100 mg via ORAL
  Filled 2021-09-29 (×16): qty 1

## 2021-09-29 MED ORDER — LORAZEPAM 2 MG/ML IJ SOLN
1.0000 mg | INTRAMUSCULAR | Status: DC | PRN
  Administered 2021-09-30: 2 mg via INTRAVENOUS
  Filled 2021-09-29: qty 1

## 2021-09-29 MED ORDER — POTASSIUM CHLORIDE 2 MEQ/ML IV SOLN
INTRAVENOUS | Status: DC
  Filled 2021-09-29 (×2): qty 1000

## 2021-09-29 MED ORDER — MORPHINE SULFATE (PF) 4 MG/ML IV SOLN
4.0000 mg | Freq: Once | INTRAVENOUS | Status: AC
Start: 2021-09-29 — End: 2021-09-29
  Administered 2021-09-29: 4 mg via INTRAVENOUS
  Filled 2021-09-29: qty 1

## 2021-09-29 MED ORDER — POTASSIUM CHLORIDE 10 MEQ/100ML IV SOLN
10.0000 meq | INTRAVENOUS | Status: DC
Start: 2021-09-29 — End: 2021-09-29

## 2021-09-29 MED ORDER — LEVETIRACETAM IN NACL 1000 MG/100ML IV SOLN
1000.0000 mg | Freq: Once | INTRAVENOUS | Status: AC
  Administered 2021-09-29: 1000 mg via INTRAVENOUS
  Filled 2021-09-29: qty 100

## 2021-09-29 MED ORDER — TETANUS-DIPHTH-ACELL PERTUSSIS 5-2.5-18.5 LF-MCG/0.5 IM SUSY
0.5000 mL | PREFILLED_SYRINGE | Freq: Once | INTRAMUSCULAR | Status: AC
  Administered 2021-09-29: 0.5 mL via INTRAMUSCULAR
  Filled 2021-09-29: qty 0.5

## 2021-09-29 NOTE — Progress Notes (Signed)
This chaplain responded to Level 2 trauma with the medical team.  The chaplain checked in with the RN-Ryan and understands, no needs at this time.  The chaplain is available for F/U spiritual care as needed.
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
## 2021-09-29 NOTE — ED Notes (Signed)
Patient transported to CT 

## 2021-09-29 NOTE — ED Notes (Signed)
Patient transported to MRI 

## 2021-09-29 NOTE — Progress Notes (Signed)
Patient ID: Martha Campbell, female   DOB: [birth year], 45 y.o.   MRN: [id number] BP 128/74   Pulse 90   Temp 97.9 F (36.6 C) (Oral)   Resp 15   SpO2 94%  Called regarding spinal column fractures. Will need MRI to determine age of multiple fractures. Radiologist unable to determine.  Orders placed

## 2021-09-29 NOTE — ED Notes (Signed)
Lactic acid 4.6. MD Darl Householder made aware.

## 2021-09-29 NOTE — ED Notes (Signed)
Pt passed her swallow.
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
## 2021-09-30 ENCOUNTER — Inpatient Hospital Stay (HOSPITAL_COMMUNITY): Payer: Self-pay

## 2021-09-30 DIAGNOSIS — E876 Hypokalemia: Secondary | ICD-10-CM

## 2021-09-30 DIAGNOSIS — D696 Thrombocytopenia, unspecified: Secondary | ICD-10-CM

## 2021-09-30 LAB — CBC
HCT: 41.6 % (ref 36.0–46.0)
Hemoglobin: 14.4 g/dL (ref 12.0–15.0)
MCH: 34 pg (ref 26.0–34.0)
MCHC: 34.6 g/dL (ref 30.0–36.0)
MCV: 98.3 fL (ref 80.0–100.0)
Platelets: 37 10*3/uL — ABNORMAL LOW (ref 150–400)
RBC: 4.23 MIL/uL (ref 3.87–5.11)
RDW: 15.4 % (ref 11.5–15.5)
WBC: 5.9 10*3/uL (ref 4.0–10.5)
nRBC: 0 % (ref 0.0–0.2)

## 2021-09-30 LAB — HIV ANTIBODY (ROUTINE TESTING W REFLEX): HIV Screen 4th Generation wRfx: NONREACTIVE

## 2021-09-30 LAB — COMPREHENSIVE METABOLIC PANEL
ALT: 77 U/L — ABNORMAL HIGH (ref 0–44)
AST: 110 U/L — ABNORMAL HIGH (ref 15–41)
Albumin: 3.6 g/dL (ref 3.5–5.0)
Alkaline Phosphatase: 150 U/L — ABNORMAL HIGH (ref 38–126)
Anion gap: 8 (ref 5–15)
BUN: <5 mg/dL — ABNORMAL LOW (ref 6–20)
CO2: 25 mmol/L (ref 22–32)
Calcium: 8.8 mg/dL — ABNORMAL LOW (ref 8.9–10.3)
Chloride: 98 mmol/L (ref 98–111)
Creatinine, Ser: 0.55 mg/dL (ref 0.44–1.00)
GFR, Estimated: >60 mL/min (ref >60)
Glucose, Bld: 95 mg/dL (ref 70–99)
Potassium: 3.9 mmol/L (ref 3.5–5.1)
Sodium: 131 mmol/L — ABNORMAL LOW (ref 135–145)
Total Bilirubin: 1.3 mg/dL — ABNORMAL HIGH (ref 0.3–1.2)
Total Protein: 6.5 g/dL (ref 6.5–8.1)

## 2021-09-30 LAB — LACTIC ACID, PLASMA: Lactic Acid, Venous: 0.8 mmol/L (ref 0.5–1.9)

## 2021-09-30 LAB — OSMOLALITY, URINE: Osmolality, Ur: 665 mOsm/kg (ref 300–900)

## 2021-09-30 LAB — OSMOLALITY: Osmolality: 276 mOsm/kg (ref 275–295)

## 2021-09-30 LAB — CK: Total CK: 167 U/L (ref 38–234)

## 2021-09-30 LAB — MAGNESIUM: Magnesium: 1.3 mg/dL — ABNORMAL LOW (ref 1.7–2.4)

## 2021-09-30 MED ORDER — OXYCODONE HCL 5 MG PO TABS
10.0000 mg | ORAL_TABLET | ORAL | Status: DC | PRN
  Administered 2021-10-04 – 2021-10-14 (×20): 10 mg via ORAL
  Filled 2021-09-30 (×21): qty 2

## 2021-09-30 MED ORDER — FOLIC ACID 1 MG PO TABS
1.0000 mg | ORAL_TABLET | Freq: Every day | ORAL | Status: DC
  Administered 2021-10-01 – 2021-10-14 (×14): 1 mg via ORAL
  Filled 2021-09-30 (×14): qty 1

## 2021-09-30 MED ORDER — LACTATED RINGERS IV BOLUS
1000.0000 mL | Freq: Once | INTRAVENOUS | Status: AC
  Administered 2021-09-30: 1000 mL via INTRAVENOUS

## 2021-09-30 MED ORDER — LORAZEPAM 1 MG PO TABS
2.0000 mg | ORAL_TABLET | ORAL | Status: DC | PRN
  Administered 2021-10-02: 2 mg via ORAL
  Filled 2021-09-30: qty 2

## 2021-09-30 MED ORDER — HYDROMORPHONE HCL 1 MG/ML IJ SOLN
1.0000 mg | INTRAMUSCULAR | Status: DC | PRN
  Administered 2021-10-04 – 2021-10-12 (×4): 1 mg via INTRAVENOUS
  Filled 2021-09-30 (×4): qty 1

## 2021-09-30 MED ORDER — LORAZEPAM 1 MG PO TABS
1.0000 mg | ORAL_TABLET | ORAL | Status: DC | PRN
  Administered 2021-10-01: 1 mg via ORAL
  Filled 2021-09-30: qty 1

## 2021-09-30 MED ORDER — OXYCODONE HCL 5 MG PO TABS
5.0000 mg | ORAL_TABLET | ORAL | Status: DC | PRN
Start: 2021-09-30 — End: 2021-10-14
  Administered 2021-09-30 – 2021-10-12 (×9): 5 mg via ORAL
  Filled 2021-09-30 (×9): qty 1

## 2021-09-30 NOTE — Progress Notes (Signed)
PT Cancellation Note  Patient Details Name: Martha Campbell MRN: [id number] DOB: 09-15-1976   Cancelled Treatment:    Reason Eval/Treat Not Completed: Medical issues which prohibited therapy Pt is in EtOH withdrawls at this time and not appropriate for therapy. Additionally, pt does not have TLSO yet.  Abran Richard, PT Acute Rehab Services Pager [phone] [facility] [place] 09/30/2021, 1:53 PM

## 2021-09-30 NOTE — ED Notes (Signed)
Changed pt linens, gown, and provided fresh warm blankets.

## 2021-09-30 NOTE — Progress Notes (Signed)
Patient ID: Martha Campbell, female   DOB: Sep 19, 1976, 45 y.o.   MRN: [id number] Alert and oriented x 4 Moving all extremities Brace ordered Bedrest until brace arrives.
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
## 2021-09-30 NOTE — Progress Notes (Signed)
Orthopedic Tech Progress Note Patient Details:  Martha Campbell 1976/12/12 [id number]  Patient ID: Martha Campbell, female   DOB: 1976/03/29, 45 y.o.   MRN: [id number] Dropped off TLSO brace.  Edwina Barth 09/30/2021, 8:39 PM

## 2021-10-01 DIAGNOSIS — S32002A Unstable burst fracture of unspecified lumbar vertebra, initial encounter for closed fracture: Secondary | ICD-10-CM

## 2021-10-01 LAB — COMPREHENSIVE METABOLIC PANEL
ALT: 65 U/L — ABNORMAL HIGH (ref 0–44)
AST: 118 U/L — ABNORMAL HIGH (ref 15–41)
Albumin: 3.1 g/dL — ABNORMAL LOW (ref 3.5–5.0)
Alkaline Phosphatase: 143 U/L — ABNORMAL HIGH (ref 38–126)
Anion gap: 10 (ref 5–15)
BUN: <5 mg/dL — ABNORMAL LOW (ref 6–20)
CO2: 23 mmol/L (ref 22–32)
Calcium: 9.1 mg/dL (ref 8.9–10.3)
Chloride: 98 mmol/L (ref 98–111)
Creatinine, Ser: 0.48 mg/dL (ref 0.44–1.00)
GFR, Estimated: >60 mL/min (ref >60)
Glucose, Bld: 93 mg/dL (ref 70–99)
Potassium: 3.4 mmol/L — ABNORMAL LOW (ref 3.5–5.1)
Sodium: 131 mmol/L — ABNORMAL LOW (ref 135–145)
Total Bilirubin: 1 mg/dL (ref 0.3–1.2)
Total Protein: 6.1 g/dL — ABNORMAL LOW (ref 6.5–8.1)

## 2021-10-01 LAB — CBC WITH DIFFERENTIAL/PLATELET
Abs Immature Granulocytes: 0.03 10*3/uL (ref 0.00–0.07)
Basophils Absolute: 0 10*3/uL (ref 0.0–0.1)
Basophils Relative: 0 %
Eosinophils Absolute: 0.1 10*3/uL (ref 0.0–0.5)
Eosinophils Relative: 2 %
HCT: 38.9 % (ref 36.0–46.0)
Hemoglobin: 13.6 g/dL (ref 12.0–15.0)
Immature Granulocytes: 1 %
Lymphocytes Relative: 25 %
Lymphs Abs: 1.3 10*3/uL (ref 0.7–4.0)
MCH: 34.3 pg — ABNORMAL HIGH (ref 26.0–34.0)
MCHC: 35 g/dL (ref 30.0–36.0)
MCV: 98 fL (ref 80.0–100.0)
Monocytes Absolute: 0.2 10*3/uL (ref 0.1–1.0)
Monocytes Relative: 3 %
Neutro Abs: 3.5 10*3/uL (ref 1.7–7.7)
Neutrophils Relative %: 69 %
Platelets: 34 10*3/uL — ABNORMAL LOW (ref 150–400)
RBC: 3.97 MIL/uL (ref 3.87–5.11)
RDW: 15 % (ref 11.5–15.5)
WBC: 5.1 10*3/uL (ref 4.0–10.5)
nRBC: 0 % (ref 0.0–0.2)

## 2021-10-01 LAB — MAGNESIUM: Magnesium: 1.6 mg/dL — ABNORMAL LOW (ref 1.7–2.4)

## 2021-10-01 LAB — PATHOLOGIST SMEAR REVIEW

## 2021-10-01 LAB — GLUCOSE, CAPILLARY: Glucose-Capillary: 103 mg/dL — ABNORMAL HIGH (ref 70–99)

## 2021-10-01 MED ORDER — POTASSIUM CHLORIDE CRYS ER 20 MEQ PO TBCR
40.0000 meq | EXTENDED_RELEASE_TABLET | Freq: Once | ORAL | Status: AC
  Administered 2021-10-01: 40 meq via ORAL
  Filled 2021-10-01: qty 2

## 2021-10-01 MED ORDER — MAGNESIUM SULFATE 2 GM/50ML IV SOLN
2.0000 g | Freq: Once | INTRAVENOUS | Status: AC
  Administered 2021-10-01: 2 g via INTRAVENOUS
  Filled 2021-10-01: qty 50

## 2021-10-01 MED ORDER — MAGIC MOUTHWASH W/LIDOCAINE
1.0000 mL | Freq: Every day | ORAL | Status: DC | PRN
  Administered 2021-10-04: 1 mL via ORAL
  Filled 2021-10-01 (×3): qty 5

## 2021-10-01 NOTE — Progress Notes (Addendum)
On call paged again about pt still trying to get up out of bed unassisted.  Bed alarm now on sensitive setting do to pt being at end of bed before alarm sounding.  Pt now reports now trying to get up to go smoke.
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
## 2021-10-01 NOTE — Progress Notes (Signed)
Patient ID: Martha Campbell, female   DOB: [birth year], 45 y.o.   MRN: [id number] BP (!) 125/95 (BP Location: Left Arm)   Pulse (!) 108   Temp 98.6 F (37 C) (Oral)   Resp 18   Ht 5\' 5"  (1.651 m)   Wt 60 kg   SpO2 98%   BMI 22.01 kg/m  Brace received, but not fitting well. Martha Campbell was told a representative would return at some point this week to adjust.  Moving all extremities well No neurological change No new recommendations
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
## 2021-10-01 NOTE — Progress Notes (Signed)
Inpatient Rehab Admissions Coordinator Note:   Per therapy recommendations patient was screened for CIR candidacy by Michel Santee, PT. At this time, pt appears to be a potential candidate for CIR. I will request an order for rehab consult for full assessment, per our protocol.  Please contact me any with questions.Shann Medal, PT, DPT [phone] 10/01/21 2:19 PM

## 2021-10-01 NOTE — Progress Notes (Signed)
Paged by RN about patient attempting to get up out of bed again.  When nursing went to intervene, the patient reported that she saw a pack of cigarettes laying in the room.  Evaluated patient at bedside. She endorses that she did see a pack of cigarettes. She denies audio hallucinations. Currently alert and oriented, responds appropriately to questions, able to state name, where she is, name of month, and who the president is. She is rather anxious and became tearful during our evaluation. Reports that she is feeling anxious about being unemployed, bills to pay, and her mother, all worsened by being in the hospital. States she feels like a Doctor, general practice. She is not tremulous. She is tachycardic at 110-120 and endorses some diaphoresis. CIWA score of 9. Reassured patient that we are here to help. Do not feel the need for Ativan currently given low CIWA, however recommended that the patient have soft belt placed as a gentle reminder that she needs to remain in bed. She is agreeable to safety belt placement. If she becomes more tachycardic in future, may need fluids.
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
## 2021-10-02 ENCOUNTER — Inpatient Hospital Stay (HOSPITAL_COMMUNITY): Payer: Self-pay

## 2021-10-02 DIAGNOSIS — M8448XA Pathological fracture, other site, initial encounter for fracture: Secondary | ICD-10-CM

## 2021-10-02 LAB — LACTIC ACID, PLASMA
Lactic Acid, Venous: 1.6 mmol/L (ref 0.5–1.9)
Lactic Acid, Venous: 0.9 mmol/L (ref 0.5–1.9)

## 2021-10-02 LAB — KAPPA/LAMBDA LIGHT CHAINS
Kappa free light chain: 21.7 mg/L — ABNORMAL HIGH (ref 3.3–19.4)
Kappa, lambda light chain ratio: 1.08 (ref 0.26–1.65)
Lambda free light chains: 20.1 mg/L (ref 5.7–26.3)

## 2021-10-02 LAB — GLUCOSE, CAPILLARY: Glucose-Capillary: 120 mg/dL — ABNORMAL HIGH (ref 70–99)

## 2021-10-02 MED ORDER — SODIUM CHLORIDE 0.9 % IV BOLUS
500.0000 mL | Freq: Once | INTRAVENOUS | Status: AC
  Administered 2021-10-02: 500 mL via INTRAVENOUS

## 2021-10-02 MED ORDER — LORAZEPAM 2 MG/ML IJ SOLN
2.0000 mg | Freq: Once | INTRAMUSCULAR | Status: AC
  Administered 2021-10-02: 2 mg via INTRAMUSCULAR
  Filled 2021-10-02: qty 1

## 2021-10-02 MED ORDER — LORAZEPAM 1 MG PO TABS: 1.0000 mg | ORAL_TABLET | ORAL | Status: DC | PRN

## 2021-10-02 MED ORDER — KETOROLAC TROMETHAMINE 15 MG/ML IJ SOLN
15.0000 mg | Freq: Four times a day (QID) | INTRAMUSCULAR | Status: AC | PRN
  Administered 2021-10-02 – 2021-10-07 (×3): 15 mg via INTRAVENOUS
  Filled 2021-10-02 (×3): qty 1

## 2021-10-02 MED ORDER — CHLORHEXIDINE GLUCONATE CLOTH 2 % EX PADS
6.0000 | MEDICATED_PAD | Freq: Every day | CUTANEOUS | Status: DC
  Administered 2021-10-03 – 2021-10-14 (×9): 6 via TOPICAL

## 2021-10-02 MED ORDER — LORAZEPAM 2 MG/ML IJ SOLN
1.0000 mg | Freq: Once | INTRAMUSCULAR | Status: AC
  Administered 2021-10-02: 1 mg via INTRAVENOUS
  Filled 2021-10-02: qty 1

## 2021-10-02 MED ORDER — LORAZEPAM 2 MG/ML IJ SOLN
4.0000 mg | Freq: Once | INTRAMUSCULAR | Status: AC
  Administered 2021-10-02: 4 mg via INTRAVENOUS

## 2021-10-02 MED ORDER — ACETAMINOPHEN 10 MG/ML IV SOLN
1000.0000 mg | Freq: Four times a day (QID) | INTRAVENOUS | Status: AC | PRN
  Administered 2021-10-02 – 2021-10-03 (×4): 1000 mg via INTRAVENOUS
  Filled 2021-10-02 (×4): qty 100

## 2021-10-02 MED ORDER — LORAZEPAM 2 MG/ML IJ SOLN
1.0000 mg | INTRAMUSCULAR | Status: AC | PRN
  Administered 2021-10-02: 4 mg via INTRAVENOUS
  Administered 2021-10-02 – 2021-10-03 (×7): 2 mg via INTRAVENOUS
  Filled 2021-10-02: qty 2
  Filled 2021-10-02 (×6): qty 1
  Filled 2021-10-02 (×2): qty 2
  Filled 2021-10-02: qty 1

## 2021-10-02 MED ORDER — ADULT MULTIVITAMIN W/MINERALS CH
1.0000 | ORAL_TABLET | Freq: Every day | ORAL | Status: DC
  Administered 2021-10-03 – 2021-10-14 (×12): 1 via ORAL
  Filled 2021-10-02 (×12): qty 1

## 2021-10-02 MED ORDER — LACTATED RINGERS IV BOLUS
1000.0000 mL | Freq: Once | INTRAVENOUS | Status: AC
  Administered 2021-10-02: 1000 mL via INTRAVENOUS

## 2021-10-02 MED ORDER — LORAZEPAM 2 MG/ML IJ SOLN: 1.0000 mg | Freq: Once | INTRAMUSCULAR | Status: DC

## 2021-10-02 NOTE — Progress Notes (Signed)
Patient ID: Martha Campbell, female   DOB: [birth year], 45 y.o.   MRN: [id number] BP (!) 140/111   Pulse (!) 135   Temp (!) 101.2 F (38.4 C)   Resp (!) 32   Ht 5\' 5"  (1.651 m)   Wt 59.8 kg   SpO2 95%   BMI 21.94 kg/m  Alert, agitated. Will follow some commands Moving all extremities well Cervical brace in place
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
## 2021-10-02 NOTE — Progress Notes (Signed)
PT Cancellation Note  Patient Details Name: Martha Campbell MRN: [id number] DOB: Sep 05, 1976   Cancelled Treatment:    Reason Eval/Treat Not Completed: Other (comment);Medical issues which prohibited therapy; patient just now settled in 4 pt restraints with obvious sedation.  RN requests to hold on PT today.  Will follow up another day.   Reginia Naas 10/02/2021, 2:09 PM Magda Kiel, PT Acute Rehabilitation Services [phone] Office:[phone] 10/02/2021

## 2021-10-02 NOTE — Progress Notes (Signed)
[place] Progress Note Patient Name: Martha Campbell DOB: 1976-04-24 MRN: [id number]   Date of Service  10/02/2021  HPI/Events of Note  Pt tx'ed to unit for management of EtOH w/d.  Hemodynamics are stable; although is in sinus tach due to a fever  eICU Interventions  Prn tylenol ordered; pt is not alert enough to reliably take PO APAP, therefore ordered IV     Intervention Category Evaluation Type: New Patient Evaluation  Tilden Dome 10/02/2021, 9:05 PM

## 2021-10-02 NOTE — Progress Notes (Signed)
IP rehab admissions - met with patient at the bedside.  In restraints and very restless and confused.  I called her mother to discuss rehab options, no answer, left a message for mom.  Not ready for inpatient rehab at this time, but will continue to follow progress.  Call for questions.  [phone]
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
## 2021-10-02 NOTE — Significant Event (Signed)
Rapid Response Event Note   Reason for Call :  Called earlier in the day regarding need for ativan for CiWA,  patient with q1 hr PRN ativan order.  Pt has received minimal doses of ativan at this am.  Recommended appropriately dosing with ativan. FU about 1 pm patient has received about 12 mg ativan and is less restless and responding to ativan  Initial Focused Assessment:  Returned to follow up on patient. She now has a fever of 101 BP 135/96  ST 140s  RR 28 Patient is alert but confused, mildly restless Mild increased work of breathing She denies pain or shortness of breath  No tylenol per IMTS Placed ice packs and fan  Awaiting lab draw  CCM at bedside to assess patient   Interventions:  1 L LR bolus 15mg  toradol 2 mg ativan  Plan of Care:  Tx to ICU for additional care   Event Summary:   MD Notified: Rylee Christian Call Time:  Arrival Time: End Time: 1900  Raliegh Ip, RN

## 2021-10-02 NOTE — Progress Notes (Signed)
Patient extremely restless and agitated, attempting out of bed continuously and hallucinating. Tele camera was put at bedside and patient in posey belt. Tele camera proceeded to go off multiple times within 30 minutes and call received about frequency. Myself, patient's nurse and tech were at bedside. PRN Ativan given again and MD notified with CIWA of 20 in attempts to get ICU transfer order. Instructed to keep giving Ativan. AC and rapid response also notified of patient's status and again was told to continue to give Ativan. Patient tachycardic and hypertensive. Patient attempting to hit at staff, MD at bedside and wrist restraints were ordered;  Patient continued to be agitated, confused, restless, sweating and hallucinating. CCM MD came to see patient and a one time extra dose of Ativan was given. PRN Ativan range ordered q1h and given as such with appropriate CIWA scores.  Patient still tachycardic and hypertensive. Refused to eat or drink much at all for lunch. Currently she is calmer and less agitated. Will continue to monitor patient and give Ativan as needed. Leanne Chang, RN

## 2021-10-02 NOTE — Progress Notes (Addendum)
PCCM asked to see patient for oversedation.  On exam patient is awake and calm, answering questions and clearly protecting her airway.  She does now have a fever and is tachycardic, appears clinically volume depleted.  Blood cultures previously ordered.  Nursing concerned about clinical status worsening overnight, will transfer to ICU for close monitoring.  Continue CIWA, don't think she needs Precedex currently and will avoid phenobarbital for now secondary to transaminase elevation.    Otilio Carpen Karliah Kowalchuk, PA-C [facility] See Amion for pager If no response to pager , please call [phone] until 7pm After 7:00 pm call Elink  [phone]?[place]
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
## 2021-10-02 NOTE — Progress Notes (Signed)
   10/01/21 1940  What Happened  Was fall witnessed? No  Was patient injured? No (redness to knees bilaterally)  Patient found other (Comment) (crawling on hands and knees in room)  Found by  (Lesa, NT)  Stated prior activity other (comment) (pt has been bedbound; unable to stand with PT due to pain)  Follow Up  MD notified Dr. Paul Half  Time MD notified 2000  Family notified No - patient refusal  Adult Fall Risk Assessment  Risk Factor Category (scoring not indicated) High fall risk per protocol (document High fall risk);Fall has occurred during this admission (document High fall risk)  Age 45  Fall History: Fall within 6 months prior to admission 0  Elimination; Bowel and/or Urine Incontinence 0  Elimination; Bowel and/or Urine Urgency/Frequency 0  Medications: includes PCA/Opiates, Anti-convulsants, Anti-hypertensives, Diuretics, Hypnotics, Laxatives, Sedatives, and Psychotropics 5  Patient Care Equipment 3  Mobility-Assistance 2  Mobility-Gait 2  Mobility-Sensory Deficit 2  Altered awareness of immediate physical environment 1  Impulsiveness 2  Lack of understanding of one's physical/cognitive limitations 4  Total Score 21  Patient Fall Risk Level High fall risk  Adult Fall Risk Interventions  Required Bundle Interventions *See Row Information* High fall risk - low, moderate, and high requirements implemented  Additional Interventions Family Supervision;PT/OT need assessed if change in mobility from baseline;Pharmacy review of medications;Use of appropriate toileting equipment (bedpan, BSC, etc.)  Screening for Fall Injury Risk (To be completed on HIGH fall risk patients) - Assessing Need for Floor Mats  Risk For Fall Injury- Criteria for Floor Mats Confusion/dementia (+NuDESC, CIWA, TBI, etc.);Noncompliant with safety precautions;Previous fall this admission  Will Implement Floor Mats Yes

## 2021-10-02 NOTE — Progress Notes (Signed)
IP rehab admissions - I received a call back from patient's mom.  Mom says patient has no insurance and lived alone.  Mom says patient can come home with mom.  Mom works about 1 hour in the morning and about 2 hours in the afternoon, but is otherwise available.

## 2021-10-03 ENCOUNTER — Inpatient Hospital Stay (HOSPITAL_COMMUNITY): Payer: Self-pay

## 2021-10-03 DIAGNOSIS — F10931 Alcohol use, unspecified with withdrawal delirium: Secondary | ICD-10-CM

## 2021-10-03 DIAGNOSIS — R7881 Bacteremia: Secondary | ICD-10-CM

## 2021-10-03 DIAGNOSIS — B9561 Methicillin susceptible Staphylococcus aureus infection as the cause of diseases classified elsewhere: Secondary | ICD-10-CM

## 2021-10-03 LAB — PROTEIN ELECTROPHORESIS, SERUM
A/G Ratio: 1.2 (ref 0.7–1.7)
Albumin ELP: 3.2 g/dL (ref 2.9–4.4)
Alpha-1-Globulin: 0.3 g/dL (ref 0.0–0.4)
Alpha-2-Globulin: 0.5 g/dL (ref 0.4–1.0)
Beta Globulin: 1 g/dL (ref 0.7–1.3)
Gamma Globulin: 0.9 g/dL (ref 0.4–1.8)
Globulin, Total: 2.7 g/dL (ref 2.2–3.9)
Total Protein ELP: 5.9 g/dL — ABNORMAL LOW (ref 6.0–8.5)

## 2021-10-03 LAB — BASIC METABOLIC PANEL
Anion gap: 10 (ref 5–15)
Anion gap: 9 (ref 5–15)
Anion gap: 12 (ref 5–15)
Anion gap: 8 (ref 5–15)
BUN: 5 mg/dL — ABNORMAL LOW (ref 6–20)
BUN: 7 mg/dL (ref 6–20)
BUN: 7 mg/dL (ref 6–20)
BUN: 6 mg/dL (ref 6–20)
CO2: 21 mmol/L — ABNORMAL LOW (ref 22–32)
CO2: 19 mmol/L — ABNORMAL LOW (ref 22–32)
CO2: 19 mmol/L — ABNORMAL LOW (ref 22–32)
CO2: 20 mmol/L — ABNORMAL LOW (ref 22–32)
Calcium: 8.9 mg/dL (ref 8.9–10.3)
Calcium: 8.4 mg/dL — ABNORMAL LOW (ref 8.9–10.3)
Calcium: 8.3 mg/dL — ABNORMAL LOW (ref 8.9–10.3)
Calcium: 8.5 mg/dL — ABNORMAL LOW (ref 8.9–10.3)
Chloride: 98 mmol/L (ref 98–111)
Chloride: 102 mmol/L (ref 98–111)
Chloride: 99 mmol/L (ref 98–111)
Chloride: 103 mmol/L (ref 98–111)
Creatinine, Ser: 0.54 mg/dL (ref 0.44–1.00)
Creatinine, Ser: 0.53 mg/dL (ref 0.44–1.00)
Creatinine, Ser: 0.63 mg/dL (ref 0.44–1.00)
Creatinine, Ser: 0.6 mg/dL (ref 0.44–1.00)
GFR, Estimated: >60 mL/min (ref >60)
GFR, Estimated: >60 mL/min (ref >60)
GFR, Estimated: >60 mL/min (ref >60)
GFR, Estimated: >60 mL/min (ref >60)
Glucose, Bld: 120 mg/dL — ABNORMAL HIGH (ref 70–99)
Glucose, Bld: 132 mg/dL — ABNORMAL HIGH (ref 70–99)
Glucose, Bld: 136 mg/dL — ABNORMAL HIGH (ref 70–99)
Glucose, Bld: 120 mg/dL — ABNORMAL HIGH (ref 70–99)
Potassium: 2.9 mmol/L — ABNORMAL LOW (ref 3.5–5.1)
Potassium: 3.4 mmol/L — ABNORMAL LOW (ref 3.5–5.1)
Potassium: 3.4 mmol/L — ABNORMAL LOW (ref 3.5–5.1)
Potassium: 3.8 mmol/L (ref 3.5–5.1)
Sodium: 129 mmol/L — ABNORMAL LOW (ref 135–145)
Sodium: 130 mmol/L — ABNORMAL LOW (ref 135–145)
Sodium: 130 mmol/L — ABNORMAL LOW (ref 135–145)
Sodium: 131 mmol/L — ABNORMAL LOW (ref 135–145)

## 2021-10-03 LAB — ECHOCARDIOGRAM COMPLETE
Height: 65 in
S' Lateral: 3.1 cm
Weight: 2109.36 oz

## 2021-10-03 LAB — BLOOD CULTURE ID PANEL (REFLEXED) - BCID2

## 2021-10-03 LAB — URINALYSIS, COMPLETE (UACMP) WITH MICROSCOPIC
Bilirubin Urine: NEGATIVE
Glucose, UA: NEGATIVE mg/dL
Ketones, ur: 5 mg/dL — AB
Leukocytes,Ua: NEGATIVE
Nitrite: NEGATIVE
Protein, ur: 30 mg/dL — AB
Specific Gravity, Urine: 1.031 — ABNORMAL HIGH (ref 1.005–1.030)
pH: 5 (ref 5.0–8.0)

## 2021-10-03 LAB — CBC
HCT: 41.1 % (ref 36.0–46.0)
Hemoglobin: 14.7 g/dL (ref 12.0–15.0)
MCH: 34.3 pg — ABNORMAL HIGH (ref 26.0–34.0)
MCHC: 35.8 g/dL (ref 30.0–36.0)
MCV: 96 fL (ref 80.0–100.0)
Platelets: 91 10*3/uL — ABNORMAL LOW (ref 150–400)
RBC: 4.28 MIL/uL (ref 3.87–5.11)
RDW: 15.2 % (ref 11.5–15.5)
WBC: 4.4 10*3/uL (ref 4.0–10.5)
nRBC: 0 % (ref 0.0–0.2)

## 2021-10-03 LAB — GLUCOSE, CAPILLARY: Glucose-Capillary: 122 mg/dL — ABNORMAL HIGH (ref 70–99)

## 2021-10-03 LAB — MRSA NEXT GEN BY PCR, NASAL: MRSA by PCR Next Gen: NOT DETECTED

## 2021-10-03 MED ORDER — CEFAZOLIN SODIUM-DEXTROSE 2-4 GM/100ML-% IV SOLN: 2.0000 g | Freq: Three times a day (TID) | INTRAVENOUS | Status: DC

## 2021-10-03 MED ORDER — VANCOMYCIN HCL 1.25 G IV SOLR
1250.0000 mg | Freq: Once | INTRAVENOUS | Status: DC
  Filled 2021-10-03: qty 25

## 2021-10-03 MED ORDER — SODIUM CHLORIDE 0.9 % BOLUS PEDS
500.0000 mL | Freq: Once | INTRAVENOUS | Status: AC
  Administered 2021-10-03: 500 mL via INTRAVENOUS

## 2021-10-03 MED ORDER — CEFAZOLIN SODIUM-DEXTROSE 2-4 GM/100ML-% IV SOLN
2.0000 g | Freq: Three times a day (TID) | INTRAVENOUS | Status: DC
  Administered 2021-10-03 – 2021-10-08 (×15): 2 g via INTRAVENOUS
  Filled 2021-10-03 (×18): qty 100

## 2021-10-03 MED ORDER — VANCOMYCIN HCL 10 G IV SOLR: 1750.0000 mg | INTRAVENOUS | Status: DC

## 2021-10-03 MED ORDER — MAGNESIUM SULFATE 2 GM/50ML IV SOLN
2.0000 g | Freq: Once | INTRAVENOUS | Status: AC
  Administered 2021-10-03: 2 g via INTRAVENOUS
  Filled 2021-10-03: qty 50

## 2021-10-03 MED ORDER — POTASSIUM CHLORIDE 10 MEQ/100ML IV SOLN
10.0000 meq | INTRAVENOUS | Status: AC
  Administered 2021-10-03 (×6): 10 meq via INTRAVENOUS
  Filled 2021-10-03 (×4): qty 100

## 2021-10-03 MED ORDER — PERFLUTREN LIPID MICROSPHERE
1.0000 mL | INTRAVENOUS | Status: AC | PRN
  Administered 2021-10-03: 2 mL via INTRAVENOUS
  Filled 2021-10-03: qty 10

## 2021-10-03 MED ORDER — POTASSIUM CHLORIDE 10 MEQ/100ML IV SOLN
10.0000 meq | INTRAVENOUS | Status: AC
  Administered 2021-10-03 (×4): 10 meq via INTRAVENOUS
  Filled 2021-10-03 (×3): qty 100

## 2021-10-03 NOTE — Progress Notes (Signed)
E-link paged by this RN due to patient's 0500 labs.   Sodium 129 Potassium 2.9  Waiting to receive orders. RN will continue to monitor.   Wyn Quaker, RN
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
## 2021-10-03 NOTE — Progress Notes (Signed)
Inpatient Rehab Admissions Coordinator:    Pt. Agitated and in restraints at this time, not ready for CIR admission at this time. I will follow up with Pt. And family on Monday.   Clemens Catholic, [place], [place] Admissions Coordinator  [phone] ([place]) [phone] (office)
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
## 2021-10-03 NOTE — Progress Notes (Signed)
Mother Zigmund Daniel) was notified of transfer to ICU

## 2021-10-03 NOTE — Progress Notes (Signed)
Echocardiogram 2D Echocardiogram has been performed.  Oneal Deputy Shawana Knoch RDCS 10/03/2021, 12:15 PM
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
## 2021-10-03 NOTE — Progress Notes (Signed)
eLink Physician-Brief Progress Note Patient Name: Martha Campbell DOB: 27-Jun-1976 MRN: [id number]   Date of Service  10/03/2021  HPI/Events of Note  Na+ 129 K+ 2.9  eICU Interventions  Ordered six runs of K via periph IV     Intervention Category Minor Interventions: Electrolytes abnormality - evaluation and management  Tilden Dome 10/03/2021, 5:47 AM

## 2021-10-03 NOTE — Progress Notes (Signed)
OT Cancellation Note  Patient Details Name: Martha Campbell MRN: [id number] DOB: April 11, 1976   Cancelled Treatment:    Reason Eval/Treat Not Completed: Medical issues which prohibited therapy. Pt transferred to ICU. Nsg asking to hold at this time. Will follow up later date.   Ramond Dial, OT/L   Acute OT Clinical Specialist Acute Rehabilitation Services Pager [phone] Office [phone]  10/03/2021, 8:24 AM

## 2021-10-03 NOTE — Progress Notes (Signed)
MD notified of pt's fever of 102, MD at bedside, aware of HR 130 and 140s. MD will assess pt and place new orders.
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
## 2021-10-04 DIAGNOSIS — A4101 Sepsis due to Methicillin susceptible Staphylococcus aureus: Secondary | ICD-10-CM

## 2021-10-04 DIAGNOSIS — R509 Fever, unspecified: Secondary | ICD-10-CM

## 2021-10-04 LAB — COMPREHENSIVE METABOLIC PANEL
ALT: 255 U/L — ABNORMAL HIGH (ref 0–44)
AST: 384 U/L — ABNORMAL HIGH (ref 15–41)
Albumin: 2.9 g/dL — ABNORMAL LOW (ref 3.5–5.0)
Alkaline Phosphatase: 166 U/L — ABNORMAL HIGH (ref 38–126)
Anion gap: 9 (ref 5–15)
BUN: 9 mg/dL (ref 6–20)
CO2: 19 mmol/L — ABNORMAL LOW (ref 22–32)
Calcium: 8.7 mg/dL — ABNORMAL LOW (ref 8.9–10.3)
Chloride: 103 mmol/L (ref 98–111)
Creatinine, Ser: 0.63 mg/dL (ref 0.44–1.00)
GFR, Estimated: >60 mL/min (ref >60)
Glucose, Bld: 101 mg/dL — ABNORMAL HIGH (ref 70–99)
Potassium: 3.7 mmol/L (ref 3.5–5.1)
Sodium: 131 mmol/L — ABNORMAL LOW (ref 135–145)
Total Bilirubin: 1.7 mg/dL — ABNORMAL HIGH (ref 0.3–1.2)
Total Protein: 6.6 g/dL (ref 6.5–8.1)

## 2021-10-04 LAB — PROTIME-INR
INR: 1.1 (ref 0.8–1.2)
Prothrombin Time: 14.1 seconds (ref 11.4–15.2)

## 2021-10-04 LAB — MAGNESIUM: Magnesium: 2.1 mg/dL (ref 1.7–2.4)

## 2021-10-04 LAB — URINE CULTURE: Culture: NO GROWTH

## 2021-10-04 LAB — HEPATITIS PANEL, ACUTE
HCV Ab: NONREACTIVE
Hep A IgM: NONREACTIVE
Hep B C IgM: NONREACTIVE
Hepatitis B Surface Ag: NONREACTIVE

## 2021-10-04 LAB — CBC
HCT: 39.6 % (ref 36.0–46.0)
Hemoglobin: 13.9 g/dL (ref 12.0–15.0)
MCH: 34.2 pg — ABNORMAL HIGH (ref 26.0–34.0)
MCHC: 35.1 g/dL (ref 30.0–36.0)
MCV: 97.3 fL (ref 80.0–100.0)
Platelets: 134 10*3/uL — ABNORMAL LOW (ref 150–400)
RBC: 4.07 MIL/uL (ref 3.87–5.11)
RDW: 15.7 % — ABNORMAL HIGH (ref 11.5–15.5)
WBC: 2.8 10*3/uL — ABNORMAL LOW (ref 4.0–10.5)
nRBC: 0 % (ref 0.0–0.2)

## 2021-10-04 LAB — HEPATITIS B SURFACE ANTIGEN: Hepatitis B Surface Ag: NONREACTIVE

## 2021-10-04 LAB — VITAMIN B1: Vitamin B1 (Thiamine): 199.2 nmol/L (ref 66.5–200.0)

## 2021-10-04 MED ORDER — LACTATED RINGERS IV BOLUS
1000.0000 mL | Freq: Once | INTRAVENOUS | Status: AC
  Administered 2021-10-04: 1000 mL via INTRAVENOUS

## 2021-10-04 MED ORDER — CHLORDIAZEPOXIDE HCL 25 MG PO CAPS
25.0000 mg | ORAL_CAPSULE | Freq: Every day | ORAL | Status: AC
  Administered 2021-10-07: 25 mg via ORAL
  Filled 2021-10-04: qty 1

## 2021-10-04 MED ORDER — SENNOSIDES-DOCUSATE SODIUM 8.6-50 MG PO TABS
1.0000 | ORAL_TABLET | Freq: Every day | ORAL | Status: DC
  Administered 2021-10-04 – 2021-10-06 (×3): 1 via ORAL
  Filled 2021-10-04 (×4): qty 1

## 2021-10-04 MED ORDER — POLYETHYLENE GLYCOL 3350 17 G PO PACK
17.0000 g | PACK | Freq: Every day | ORAL | Status: DC
  Administered 2021-10-04 – 2021-10-06 (×2): 17 g via ORAL
  Filled 2021-10-04 (×2): qty 1

## 2021-10-04 MED ORDER — CHLORDIAZEPOXIDE HCL 25 MG PO CAPS
25.0000 mg | ORAL_CAPSULE | Freq: Four times a day (QID) | ORAL | Status: AC
  Administered 2021-10-04 (×4): 25 mg via ORAL
  Filled 2021-10-04 (×4): qty 1

## 2021-10-04 MED ORDER — LACTATED RINGERS IV SOLN: INTRAVENOUS | Status: AC

## 2021-10-04 MED ORDER — LACTATED RINGERS IV SOLN: INTRAVENOUS | Status: DC

## 2021-10-04 MED ORDER — CHLORDIAZEPOXIDE HCL 25 MG PO CAPS
25.0000 mg | ORAL_CAPSULE | Freq: Three times a day (TID) | ORAL | Status: AC
  Administered 2021-10-05 (×3): 25 mg via ORAL
  Filled 2021-10-04 (×3): qty 1

## 2021-10-04 MED ORDER — ACETAMINOPHEN 325 MG PO TABS
325.0000 mg | ORAL_TABLET | Freq: Four times a day (QID) | ORAL | Status: DC | PRN
  Administered 2021-10-04 – 2021-10-06 (×6): 325 mg via ORAL
  Filled 2021-10-04 (×6): qty 1

## 2021-10-04 MED ORDER — CHLORDIAZEPOXIDE HCL 25 MG PO CAPS
25.0000 mg | ORAL_CAPSULE | ORAL | Status: AC
  Administered 2021-10-06 (×2): 25 mg via ORAL
  Filled 2021-10-04 (×2): qty 1
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
## 2021-10-05 ENCOUNTER — Inpatient Hospital Stay (HOSPITAL_COMMUNITY): Payer: Self-pay

## 2021-10-05 LAB — CULTURE, BLOOD (ROUTINE X 2)
Special Requests: ADEQUATE
Special Requests: ADEQUATE
Special Requests: ADEQUATE

## 2021-10-05 LAB — COMPREHENSIVE METABOLIC PANEL
ALT: 120 U/L — ABNORMAL HIGH (ref 0–44)
AST: 90 U/L — ABNORMAL HIGH (ref 15–41)
Albumin: 2.2 g/dL — ABNORMAL LOW (ref 3.5–5.0)
Alkaline Phosphatase: 124 U/L (ref 38–126)
Anion gap: 6 (ref 5–15)
BUN: 7 mg/dL (ref 6–20)
CO2: 22 mmol/L (ref 22–32)
Calcium: 8.4 mg/dL — ABNORMAL LOW (ref 8.9–10.3)
Chloride: 103 mmol/L (ref 98–111)
Creatinine, Ser: 0.52 mg/dL (ref 0.44–1.00)
GFR, Estimated: >60 mL/min (ref >60)
Glucose, Bld: 92 mg/dL (ref 70–99)
Potassium: 3.2 mmol/L — ABNORMAL LOW (ref 3.5–5.1)
Sodium: 131 mmol/L — ABNORMAL LOW (ref 135–145)
Total Bilirubin: 0.8 mg/dL (ref 0.3–1.2)
Total Protein: 5.3 g/dL — ABNORMAL LOW (ref 6.5–8.1)

## 2021-10-05 LAB — CBC WITH DIFFERENTIAL/PLATELET
Abs Immature Granulocytes: 0 10*3/uL (ref 0.00–0.07)
Basophils Absolute: 0 10*3/uL (ref 0.0–0.1)
Basophils Relative: 0 %
Eosinophils Absolute: 0.1 10*3/uL (ref 0.0–0.5)
Eosinophils Relative: 2 %
HCT: 31 % — ABNORMAL LOW (ref 36.0–46.0)
Hemoglobin: 11 g/dL — ABNORMAL LOW (ref 12.0–15.0)
Lymphocytes Relative: 21 %
Lymphs Abs: 0.7 10*3/uL (ref 0.7–4.0)
MCH: 34.6 pg — ABNORMAL HIGH (ref 26.0–34.0)
MCHC: 35.5 g/dL (ref 30.0–36.0)
MCV: 97.5 fL (ref 80.0–100.0)
Monocytes Absolute: 0.3 10*3/uL (ref 0.1–1.0)
Monocytes Relative: 9 %
Neutro Abs: 2.2 10*3/uL (ref 1.7–7.7)
Neutrophils Relative %: 68 %
Platelets: 197 10*3/uL (ref 150–400)
RBC: 3.18 MIL/uL — ABNORMAL LOW (ref 3.87–5.11)
RDW: 15.9 % — ABNORMAL HIGH (ref 11.5–15.5)
WBC: 3.3 10*3/uL — ABNORMAL LOW (ref 4.0–10.5)
nRBC: 0 % (ref 0.0–0.2)
nRBC: 0 /100 WBC

## 2021-10-05 LAB — HCV AB W REFLEX TO QUANT PCR: HCV Ab: <0.1 s/co ratio (ref 0.0–0.9)

## 2021-10-05 LAB — MAGNESIUM: Magnesium: 2 mg/dL (ref 1.7–2.4)

## 2021-10-05 LAB — HCV INTERPRETATION

## 2021-10-05 LAB — PROTIME-INR
INR: 1 (ref 0.8–1.2)
Prothrombin Time: 13.1 seconds (ref 11.4–15.2)

## 2021-10-05 LAB — GLUCOSE, CAPILLARY: Glucose-Capillary: 98 mg/dL (ref 70–99)

## 2021-10-05 MED ORDER — POTASSIUM CHLORIDE CRYS ER 20 MEQ PO TBCR
40.0000 meq | EXTENDED_RELEASE_TABLET | Freq: Once | ORAL | Status: AC
  Administered 2021-10-05: 40 meq via ORAL
  Filled 2021-10-05: qty 2

## 2021-10-05 NOTE — Progress Notes (Signed)
ID Brief Note   Repeat blood cx 10/14 ( different sites ) GPC in 4/4 bottles  Repeat blood cx 10/15 ( same site and same time ) GPC in 2/4 bottles  Febrile. BP stable   Source of bacteremia seems hospital acquired ( IV line related).  TTE was a poor study and hence, will need a TEE when able to cooperate to r/o endocarditis.  Another thing to consider with persistent MRSA bacteremia is seeding of the epidural hematoma turning to epidural abscess. Neurosurgery is following    Plan  Continue cefazolin  Repeat 2 sets of blood cx tomorrow.  TEE when able  Monitor fevers, WBC count and blood cx  Rosiland Oz, MD Infectious Disease Physician [facility] [address] [place], [place] [address] Phone: [phone]  Fax: [phone]
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
## 2021-10-06 DIAGNOSIS — R509 Fever, unspecified: Secondary | ICD-10-CM

## 2021-10-06 DIAGNOSIS — A419 Sepsis, unspecified organism: Secondary | ICD-10-CM

## 2021-10-06 LAB — CBC WITH DIFFERENTIAL/PLATELET
Abs Immature Granulocytes: 0.02 10*3/uL (ref 0.00–0.07)
Basophils Absolute: 0 10*3/uL (ref 0.0–0.1)
Basophils Relative: 1 %
Eosinophils Absolute: 0.1 10*3/uL (ref 0.0–0.5)
Eosinophils Relative: 2 %
HCT: 32.6 % — ABNORMAL LOW (ref 36.0–46.0)
Hemoglobin: 11 g/dL — ABNORMAL LOW (ref 12.0–15.0)
Immature Granulocytes: 1 %
Lymphocytes Relative: 27 %
Lymphs Abs: 0.8 10*3/uL (ref 0.7–4.0)
MCH: 33.4 pg (ref 26.0–34.0)
MCHC: 33.7 g/dL (ref 30.0–36.0)
MCV: 99.1 fL (ref 80.0–100.0)
Monocytes Absolute: 0.4 10*3/uL (ref 0.1–1.0)
Monocytes Relative: 15 %
Neutro Abs: 1.6 10*3/uL — ABNORMAL LOW (ref 1.7–7.7)
Neutrophils Relative %: 54 %
Platelets: 277 10*3/uL (ref 150–400)
RBC: 3.29 MIL/uL — ABNORMAL LOW (ref 3.87–5.11)
RDW: 16.1 % — ABNORMAL HIGH (ref 11.5–15.5)
WBC: 2.8 10*3/uL — ABNORMAL LOW (ref 4.0–10.5)
nRBC: 0 % (ref 0.0–0.2)

## 2021-10-06 LAB — COMPREHENSIVE METABOLIC PANEL
ALT: 65 U/L — ABNORMAL HIGH (ref 0–44)
AST: 49 U/L — ABNORMAL HIGH (ref 15–41)
Albumin: 2.2 g/dL — ABNORMAL LOW (ref 3.5–5.0)
Alkaline Phosphatase: 155 U/L — ABNORMAL HIGH (ref 38–126)
Anion gap: 8 (ref 5–15)
BUN: 6 mg/dL (ref 6–20)
CO2: 23 mmol/L (ref 22–32)
Calcium: 8.6 mg/dL — ABNORMAL LOW (ref 8.9–10.3)
Chloride: 100 mmol/L (ref 98–111)
Creatinine, Ser: 0.56 mg/dL (ref 0.44–1.00)
GFR, Estimated: >60 mL/min (ref >60)
Glucose, Bld: 102 mg/dL — ABNORMAL HIGH (ref 70–99)
Potassium: 3.1 mmol/L — ABNORMAL LOW (ref 3.5–5.1)
Sodium: 131 mmol/L — ABNORMAL LOW (ref 135–145)
Total Bilirubin: 0.7 mg/dL (ref 0.3–1.2)
Total Protein: 5.4 g/dL — ABNORMAL LOW (ref 6.5–8.1)

## 2021-10-06 LAB — CULTURE, BLOOD (ROUTINE X 2): Special Requests: ADEQUATE

## 2021-10-06 LAB — MAGNESIUM: Magnesium: 1.7 mg/dL (ref 1.7–2.4)

## 2021-10-06 LAB — GLUCOSE, CAPILLARY: Glucose-Capillary: 81 mg/dL (ref 70–99)

## 2021-10-06 MED ORDER — POTASSIUM CHLORIDE CRYS ER 20 MEQ PO TBCR
40.0000 meq | EXTENDED_RELEASE_TABLET | Freq: Two times a day (BID) | ORAL | Status: AC
  Administered 2021-10-06 (×2): 40 meq via ORAL
  Filled 2021-10-06 (×2): qty 2

## 2021-10-06 MED ORDER — MAGNESIUM SULFATE 2 GM/50ML IV SOLN
2.0000 g | Freq: Once | INTRAVENOUS | Status: AC
  Administered 2021-10-06: 2 g via INTRAVENOUS
  Filled 2021-10-06: qty 50
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
## 2021-10-06 NOTE — Progress Notes (Signed)
Patient ID: Martha Campbell, female   DOB: [birth year], 45 y.o.   MRN: [id number] BP 102/72   Pulse (!) 115   Temp 99.7 F (37.6 C) (Oral)   Resp 20   Ht 5\' 5"  (1.651 m)   Wt 59.8 kg   SpO2 91%   BMI 21.94 kg/m  No new recommendations Moving all extremities Continue guidelines for brace.  No new recommendations.
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
## 2021-10-06 NOTE — Plan of Care (Signed)
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
## 2021-10-06 NOTE — Progress Notes (Addendum)
Inpatient Rehab Admissions Coordinator:    I spoke with PT regarding Pt.'s potential for CIR. Pt. Is progressing slowly and is difficulty to motivate for therapy, with both PT and OT recommending SNF. I am in agreement that Pt. Likely cannot tolerate the intensity of CIR at this point. CIR will sign off for now. If Pt. Progresses and demonstrates improved participation/tolerance, MD may place another order for CIR consult. Please contact me with any questions.   Clemens Catholic, [place], [place] Admissions Coordinator  [phone] ([place]) [phone] (office)

## 2021-10-06 NOTE — Progress Notes (Addendum)
Internal Medicine team rounding on patient this AM.  Currently patient is undecided at this time on undergoing TEE and has requested to discuss with her mother before making her decision.  Patient advised her mother is supposed to visit today and patient will discuss with her at that time.  Internal medicine team advised okay for patient to eat today, NPO order discontinued.    Spoke with Sande Rives PA with cardiology and she is aware of status of patient decision.   PA Sarajane Jews requested notification of when patient's mother is at bedside so cardiology can round and discuss TEE with patient and patient's mother.
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
## 2021-10-06 NOTE — Progress Notes (Signed)
   [place] has been requested to perform a transesophageal echocardiogram on Martha Campbell for bacteremia  After careful review of history and examination, the risks and benefits of transesophageal echocardiogram have been explained including risks of esophageal damage, perforation (1:10,000 risk), bleeding, pharyngeal hematoma as well as other potential complications associated with conscious sedation including aspiration, arrhythmia, respiratory failure and death. Alternatives to treatment were discussed, questions were answered. Patient and mother are willing to proceed. Patient denies any history of chest radiation and no dysphagia. She does currently have a c-collar in place.   Procedure is scheduled for 11/07/2021 at 7:30am with Dr. Harrington Challenger. Will place orders.  Darreld Mclean, PA-C 10/06/2021 11:14 AM

## 2021-10-06 NOTE — Progress Notes (Signed)
[facility] Infectious Diseases Follow Up Note  Patient Identification: Patient Name: Martha Campbell MRN: [id number] [place] Date: 09/29/2021  3:24 PM Age: 45 y.o.Today's Date: 10/06/2021   Reason for Visit: MSSA bacteremia   Active Problems:   Spinal compression fracture [facility])   Pathologic thoracic fracture   Fever   Sepsis due to methicillin susceptible Staphylococcus aureus (MSSA) with acute liver failure without hepatic coma or septic shock (HCC)   Antibiotics: cefazolin 110/14-current   Lines/Hardwares: PIVs   Interval Events: fever with T max 103.1. repeat blood cx 10/15 positive for MSSA   Assessment MSSA bacteremia in the setting of infiltrated PIV ( possibly hospital acquired ) r/o endocarditis   Multiple acute/subacute and chronic spinal fractures  Epidural hematoma  - Neurosurgery following and recommended conservative management   4.  Alcoholic Liver Disease with delirium tremens  - Primary managing - Negative acute hepatitis panel and HIV   5. Lytic lesions in the tibia/fibula - work up for MM and malignancy per primary   Recommendations Continue cefazolin. Would not recommend broadening antibiotic coverage for now as long as stable hemodynamics. She has not cleared her blood cultures and possible febrile related to that She will need a TEE to r/o endocarditis given repeat blood cultures being positive Fu repeat blood cx Monitor CBC and CMP Alcohol withdrawal management per primary Discussed with RN/ID pharmacy and primary   Rest of the management as per the primary team. Thank you for the consult. Please page with pertinent questions or concerns.  ______________________________________________________________________ Subjective patient seen and examined at the bedside.  She is in a cervical collar. RN told me she was just repositioned and is complaining of pain   Vitals VS:  T -      HR -       RR -        O2 -        BP -     Physical Exam Lying in bed, seems to be in pain and avoiding to talk  On C collar Bilateral upper extremities PIV sites looks OK with no signs of phlebitis today  Unable to lift lower extremities due to pain No peripheral joint swelling and tenderness Back unable to examine   Pertinent Microbiology Results for orders placed or performed during the hospital encounter of 09/29/21  Resp Panel by RT-PCR (Flu A&B, Covid) Nasopharyngeal Swab     Status: None   Collection Time: 09/29/21  3:34 PM   Specimen: Nasopharyngeal Swab; Nasopharyngeal(NP) swabs in vial transport medium  Result Value Ref Range Status   SARS Coronavirus 2 by RT PCR NEGATIVE NEGATIVE Final    Comment: (NOTE) SARS-CoV-2 target nucleic acids are NOT DETECTED.  The SARS-CoV-2 RNA is generally detectable in upper respiratory specimens during the acute phase of infection. The lowest concentration of SARS-CoV-2 viral copies this assay can detect is 138 copies/mL. A negative result does not preclude SARS-Cov-2 infection and should not be used as the sole basis for treatment or other patient management decisions. A negative result may occur with  improper specimen collection/handling, submission of specimen other than nasopharyngeal swab, presence of viral mutation(s) within the areas targeted by this assay, and inadequate number of viral copies(<138 copies/mL). A negative result must be combined with clinical observations, patient history, and epidemiological information. The expected result is Negative.  Fact Sheet for Patients:  EntrepreneurPulse.com.au  Fact Sheet for Healthcare Providers:  IncredibleEmployment.be  This test is no t yet approved or cleared by the  [place] Architectural technologist and  has been authorized for detection and/or diagnosis of SARS-CoV-2 by FDA under an Print production planner (EUA). This EUA will remain  in effect (meaning this test can be  used) for the duration of the COVID-19 declaration under Section 564(b)(1) of the Act, 21 U.S.C.section 360bbb-3(b)(1), unless the authorization is terminated  or revoked sooner.       Influenza A by PCR NEGATIVE NEGATIVE Final   Influenza B by PCR NEGATIVE NEGATIVE Final    Comment: (NOTE) The Xpert Xpress SARS-CoV-2/FLU/RSV plus assay is intended as an aid in the diagnosis of influenza from Nasopharyngeal swab specimens and should not be used as a sole basis for treatment. Nasal washings and aspirates are unacceptable for Xpert Xpress SARS-CoV-2/FLU/RSV testing.  Fact Sheet for Patients: EntrepreneurPulse.com.au  Fact Sheet for Healthcare Providers: IncredibleEmployment.be  This test is not yet approved or cleared by the [place] FDA and has been authorized for detection and/or diagnosis of SARS-CoV-2 by FDA under an Emergency Use Authorization (EUA). This EUA will remain in effect (meaning this test can be used) for the duration of the COVID-19 declaration under Section 564(b)(1) of the Act, 21 U.S.C. section 360bbb-3(b)(1), unless the authorization is terminated or revoked.  Performed at [facility], [place] [address] [place], [place] [address]   Culture, blood (routine x 2)     Status: Abnormal   Collection Time: 10/02/21  6:44 PM   Specimen: BLOOD RIGHT ARM  Result Value Ref Range Status   Specimen Description BLOOD RIGHT ARM  Final   Special Requests   Final    BOTTLES DRAWN AEROBIC AND ANAEROBIC Blood Culture adequate volume   Culture  Setup Time   Final    GRAM POSITIVE COCCI IN BOTH AEROBIC AND ANAEROBIC BOTTLES CRITICAL RESULT CALLED TO, READ BACK BY AND VERIFIED WITH: PHARMD ALEX L [id number] FCP Performed at [facility], [place] [address], [place], [place] [address]    Culture STAPHYLOCOCCUS AUREUS (A)  Final   Report Status 10/05/2021 FINAL  Final   Organism ID, Bacteria STAPHYLOCOCCUS AUREUS  Final       Susceptibility   Staphylococcus aureus - MIC*    CIPROFLOXACIN <=0.5 SENSITIVE Sensitive     ERYTHROMYCIN <=0.25 SENSITIVE Sensitive     GENTAMICIN <=0.5 SENSITIVE Sensitive     OXACILLIN 0.5 SENSITIVE Sensitive     TETRACYCLINE <=1 SENSITIVE Sensitive     VANCOMYCIN <=0.5 SENSITIVE Sensitive     TRIMETH/SULFA <=10 SENSITIVE Sensitive     CLINDAMYCIN <=0.25 SENSITIVE Sensitive     RIFAMPIN <=0.5 SENSITIVE Sensitive     Inducible Clindamycin NEGATIVE Sensitive     * STAPHYLOCOCCUS AUREUS  Culture, blood (routine x 2)     Status: Abnormal   Collection Time: 10/02/21  6:44 PM   Specimen: BLOOD RIGHT ARM  Result Value Ref Range Status   Specimen Description BLOOD RIGHT ARM  Final   Special Requests   Final    BOTTLES DRAWN AEROBIC AND ANAEROBIC Blood Culture adequate volume   Culture  Setup Time   Final    GRAM POSITIVE COCCI IN BOTH AEROBIC AND ANAEROBIC BOTTLES CRITICAL VALUE NOTED.  VALUE IS CONSISTENT WITH PREVIOUSLY REPORTED AND CALLED VALUE.    Culture (A)  Final    STAPHYLOCOCCUS AUREUS SUSCEPTIBILITIES PERFORMED ON PREVIOUS CULTURE WITHIN THE LAST 5 DAYS. Performed at [facility], [place] [address] [place], [place] [address]    Report Status 10/05/2021 FINAL  Final  Blood Culture ID Panel (Reflexed)     Status: Abnormal   Collection Time: 10/02/21  6:44 PM  Result Value Ref Range Status   Enterococcus faecalis NOT DETECTED NOT DETECTED Final   Enterococcus Faecium NOT DETECTED NOT DETECTED Final   Listeria monocytogenes NOT DETECTED NOT DETECTED Final   Staphylococcus species DETECTED (A) NOT DETECTED Final    Comment: CRITICAL RESULT CALLED TO, READ BACK BY AND VERIFIED WITH: A,LAWLESS PHARMD @1020  39/76/73 FCP    Staphylococcus aureus (BCID) DETECTED (A) NOT DETECTED Final    Comment: CRITICAL RESULT CALLED TO, READ BACK BY AND VERIFIED WITH: A,LAWLESS PHARMD @1020  10/03/21 FCP    Staphylococcus epidermidis NOT DETECTED NOT DETECTED Final   Staphylococcus  lugdunensis NOT DETECTED NOT DETECTED Final   Streptococcus species NOT DETECTED NOT DETECTED Final   Streptococcus agalactiae NOT DETECTED NOT DETECTED Final   Streptococcus pneumoniae NOT DETECTED NOT DETECTED Final   Streptococcus pyogenes NOT DETECTED NOT DETECTED Final   A.calcoaceticus-baumannii NOT DETECTED NOT DETECTED Final   Bacteroides fragilis NOT DETECTED NOT DETECTED Final   Enterobacterales NOT DETECTED NOT DETECTED Final   Enterobacter cloacae complex NOT DETECTED NOT DETECTED Final   Escherichia coli NOT DETECTED NOT DETECTED Final   Klebsiella aerogenes NOT DETECTED NOT DETECTED Final   Klebsiella oxytoca NOT DETECTED NOT DETECTED Final   Klebsiella pneumoniae NOT DETECTED NOT DETECTED Final   Proteus species NOT DETECTED NOT DETECTED Final   Salmonella species NOT DETECTED NOT DETECTED Final   Serratia marcescens NOT DETECTED NOT DETECTED Final   Haemophilus influenzae NOT DETECTED NOT DETECTED Final   Neisseria meningitidis NOT DETECTED NOT DETECTED Final   Pseudomonas aeruginosa NOT DETECTED NOT DETECTED Final   Stenotrophomonas maltophilia NOT DETECTED NOT DETECTED Final   Candida albicans NOT DETECTED NOT DETECTED Final   Candida auris NOT DETECTED NOT DETECTED Final   Candida glabrata NOT DETECTED NOT DETECTED Final   Candida krusei NOT DETECTED NOT DETECTED Final   Candida parapsilosis NOT DETECTED NOT DETECTED Final   Candida tropicalis NOT DETECTED NOT DETECTED Final   Cryptococcus neoformans/gattii NOT DETECTED NOT DETECTED Final   Meth resistant mecA/C and MREJ NOT DETECTED NOT DETECTED Final    Comment: Performed at [facility], [address] [place], [place] [address]  MRSA Next Gen by PCR, Nasal     Status: None   Collection Time: 10/02/21  8:34 PM   Specimen: Nasal Mucosa; Nasal Swab  Result Value Ref Range Status   MRSA by PCR Next Gen NOT DETECTED NOT DETECTED Final    Comment: (NOTE) The GeneXpert MRSA Assay (FDA approved for NASAL  specimens only), is one component of a comprehensive MRSA colonization surveillance program. It is not intended to diagnose MRSA infection nor to guide or monitor treatment for MRSA infections. Test performance is not FDA approved in patients less than 20 years old. Performed at [facility], [place] [address] [place], [place] [address]   Urine Culture     Status: None   Collection Time: 10/03/21 10:35 AM   Specimen: Urine, Clean Catch  Result Value Ref Range Status   Specimen Description URINE, CLEAN CATCH  Final   Special Requests NONE  Final   Culture   Final    NO GROWTH Performed at [facility], [place] [address] [place], [place] [address]    Report Status 10/04/2021 FINAL  Final  Culture, blood (routine x 2)     Status: Abnormal   Collection  Time: 10/03/21 10:41 AM   Specimen: BLOOD  Result Value Ref Range Status   Specimen Description BLOOD LEFT ANTECUBITAL  Final   Special Requests   Final    BOTTLES DRAWN AEROBIC AND ANAEROBIC Blood Culture adequate volume   Culture  Setup Time   Final    GRAM POSITIVE COCCI IN CLUSTERS IN BOTH AEROBIC AND ANAEROBIC BOTTLES CRITICAL VALUE NOTED.  VALUE IS CONSISTENT WITH PREVIOUSLY REPORTED AND CALLED VALUE.    Culture (A)  Final    STAPHYLOCOCCUS AUREUS SUSCEPTIBILITIES PERFORMED ON PREVIOUS CULTURE WITHIN THE LAST 5 DAYS. Performed at [facility], [place] [address], [place], [place] [address]    Report Status 10/05/2021 FINAL  Final  Culture, blood (routine x 2)     Status: Abnormal   Collection Time: 10/03/21 10:52 AM   Specimen: BLOOD LEFT HAND  Result Value Ref Range Status   Specimen Description BLOOD LEFT HAND  Final   Special Requests   Final    BOTTLES DRAWN AEROBIC AND ANAEROBIC Blood Culture results may not be optimal due to an inadequate volume of blood received in culture bottles   Culture  Setup Time   Final    IN BOTH AEROBIC AND ANAEROBIC BOTTLES GRAM POSITIVE COCCI IN CLUSTERS CRITICAL VALUE NOTED.   VALUE IS CONSISTENT WITH PREVIOUSLY REPORTED AND CALLED VALUE.    Culture (A)  Final    STAPHYLOCOCCUS AUREUS SUSCEPTIBILITIES PERFORMED ON PREVIOUS CULTURE WITHIN THE LAST 5 DAYS. Performed at [facility], [place] [address] [place], [place] [address]    Report Status 10/05/2021 FINAL  Final  Culture, blood (routine x 2)     Status: Abnormal   Collection Time: 10/04/21  4:51 AM   Specimen: BLOOD  Result Value Ref Range Status   Specimen Description BLOOD LEFT ANTECUBITAL  Final   Special Requests   Final    AEROBIC BOTTLE ONLY Blood Culture results may not be optimal due to an excessive volume of blood received in culture bottles   Culture  Setup Time   Final    GRAM POSITIVE COCCI IN CLUSTERS AEROBIC BOTTLE ONLY CRITICAL VALUE NOTED.  VALUE IS CONSISTENT WITH PREVIOUSLY REPORTED AND CALLED VALUE.    Culture (A)  Final    STAPHYLOCOCCUS AUREUS SUSCEPTIBILITIES PERFORMED ON PREVIOUS CULTURE WITHIN THE LAST 5 DAYS. Performed at [facility], [place] [address], [place], [place] [address]    Report Status 10/06/2021 FINAL  Final  Culture, blood (routine x 2)     Status: Abnormal   Collection Time: 10/04/21  4:51 AM   Specimen: BLOOD LEFT FOREARM  Result Value Ref Range Status   Specimen Description BLOOD LEFT FOREARM  Final   Special Requests AEROBIC BOTTLE ONLY Blood Culture adequate volume  Final   Culture  Setup Time   Final    GRAM POSITIVE COCCI IN CLUSTERS AEROBIC BOTTLE ONLY CRITICAL VALUE NOTED.  VALUE IS CONSISTENT WITH PREVIOUSLY REPORTED AND CALLED VALUE.    Culture (A)  Final    STAPHYLOCOCCUS AUREUS SUSCEPTIBILITIES PERFORMED ON PREVIOUS CULTURE WITHIN THE LAST 5 DAYS. Performed at [facility], [place] [address], [place], [place] [address]    Report Status 10/06/2021 FINAL  Final     Pertinent Lab. CBC Latest Ref Rng & Units 10/06/2021 10/05/2021 10/04/2021  WBC 4.0 - 10.5 K/uL 2.8(L) 3.3(L) 2.8(L)  Hemoglobin 12.0 - 15.0 g/dL 11.0(L) 11.0(L)  13.9  Hematocrit 36.0 - 46.0 % 32.6(L) 31.0(L) 39.6  Platelets 150 -  400 K/uL 277 197 134(L)   CMP Latest Ref Rng & Units 10/06/2021 10/05/2021 10/04/2021  Glucose 70 - 99 mg/dL 102(H) 92 101(H)  BUN 6 - 20 mg/dL 6 7 9   Creatinine 0.44 - 1.00 mg/dL 0.56 0.52 0.63  Sodium 135 - 145 mmol/L 131(L) 131(L) 131(L)  Potassium 3.5 - 5.1 mmol/L 3.1(L) 3.2(L) 3.7  Chloride 98 - 111 mmol/L 100 103 103  CO2 22 - 32 mmol/L 23 22 19(L)  Calcium 8.9 - 10.3 mg/dL 8.6(L) 8.4(L) 8.7(L)  Total Protein 6.5 - 8.1 g/dL 5.4(L) 5.3(L) 6.6  Total Bilirubin 0.3 - 1.2 mg/dL 0.7 0.8 1.7(H)  Alkaline Phos 38 - 126 U/L 155(H) 124 166(H)  AST 15 - 41 U/L 49(H) 90(H) 384(H)  ALT 0 - 44 U/L 65(H) 120(H) 255(H)     Pertinent Imaging today Plain films and CT images have been personally visualized and interpreted; radiology reports have been reviewed. Decision making incorporated into the Impression / Recommendations.  I spent more than 35 minutes for this patient encounter including review of prior medical records, coordination of care  with greater than 50% of time being face to face/counseling and discussing diagnostics/treatment plan with the patient/family.  Electronically signed by:   Rosiland Oz, MD Infectious Disease Physician [facility] for Infectious Disease Pager: [phone]

## 2021-10-07 ENCOUNTER — Other Ambulatory Visit (HOSPITAL_COMMUNITY): Payer: Self-pay

## 2021-10-07 ENCOUNTER — Inpatient Hospital Stay (HOSPITAL_COMMUNITY): Payer: Self-pay | Admitting: Anesthesiology

## 2021-10-07 ENCOUNTER — Encounter (HOSPITAL_COMMUNITY)
Admission: EM | Disposition: A | Discharge status: Home / Self Care | Payer: Self-pay | Source: Home / Self Care | Attending: Internal Medicine

## 2021-10-07 ENCOUNTER — Encounter (HOSPITAL_COMMUNITY): Payer: Self-pay | Admitting: Internal Medicine

## 2021-10-07 LAB — CBC WITH DIFFERENTIAL/PLATELET
Abs Immature Granulocytes: 0.03 10*3/uL (ref 0.00–0.07)
Basophils Absolute: 0 10*3/uL (ref 0.0–0.1)
Basophils Relative: 1 %
Eosinophils Absolute: 0 10*3/uL (ref 0.0–0.5)
Eosinophils Relative: 1 %
HCT: 32.7 % — ABNORMAL LOW (ref 36.0–46.0)
Hemoglobin: 11.4 g/dL — ABNORMAL LOW (ref 12.0–15.0)
Immature Granulocytes: 1 %
Lymphocytes Relative: 26 %
Lymphs Abs: 1.4 10*3/uL (ref 0.7–4.0)
MCH: 34 pg (ref 26.0–34.0)
MCHC: 34.9 g/dL (ref 30.0–36.0)
MCV: 97.6 fL (ref 80.0–100.0)
Monocytes Absolute: 0.8 10*3/uL (ref 0.1–1.0)
Monocytes Relative: 14 %
Neutro Abs: 3.1 10*3/uL (ref 1.7–7.7)
Neutrophils Relative %: 57 %
Platelets: 356 10*3/uL (ref 150–400)
RBC: 3.35 MIL/uL — ABNORMAL LOW (ref 3.87–5.11)
RDW: 15.9 % — ABNORMAL HIGH (ref 11.5–15.5)
WBC: 5.3 10*3/uL (ref 4.0–10.5)
nRBC: 0 % (ref 0.0–0.2)

## 2021-10-07 LAB — IMMUNOFIXATION ELECTROPHORESIS
IgA: 291 mg/dL (ref 87–352)
IgG (Immunoglobin G), Serum: 915 mg/dL (ref 586–1602)
IgM (Immunoglobulin M), Srm: 131 mg/dL (ref 26–217)
Total Protein ELP: 6 g/dL (ref 6.0–8.5)

## 2021-10-07 LAB — BASIC METABOLIC PANEL
Anion gap: 9 (ref 5–15)
BUN: <5 mg/dL — ABNORMAL LOW (ref 6–20)
CO2: 24 mmol/L (ref 22–32)
Calcium: 8.5 mg/dL — ABNORMAL LOW (ref 8.9–10.3)
Chloride: 100 mmol/L (ref 98–111)
Creatinine, Ser: 0.41 mg/dL — ABNORMAL LOW (ref 0.44–1.00)
GFR, Estimated: >60 mL/min (ref >60)
Glucose, Bld: 96 mg/dL (ref 70–99)
Potassium: 3.7 mmol/L (ref 3.5–5.1)
Sodium: 133 mmol/L — ABNORMAL LOW (ref 135–145)

## 2021-10-07 LAB — GLUCOSE, CAPILLARY: Glucose-Capillary: 73 mg/dL (ref 70–99)

## 2021-10-07 LAB — MAGNESIUM: Magnesium: 1.8 mg/dL (ref 1.7–2.4)

## 2021-10-07 SURGERY — CANCELLED PROCEDURE

## 2021-10-07 MED ORDER — LIDOCAINE VISCOUS HCL 2 % MT SOLN
OROMUCOSAL | Status: AC
  Filled 2021-10-07: qty 15

## 2021-10-07 MED ORDER — SODIUM CHLORIDE 0.9 % IV SOLN: INTRAVENOUS | Status: DC
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
## 2021-10-07 NOTE — Progress Notes (Signed)
   Reviewed with anesthesia.   Patient came to endoscopy unit with just C collar, no brace (painful, didn't want to wear).   Limited to doing TEE in fully supine position only.    Optimally, given C collar limitation, would like to be able to move/prop patient if intubation with TEE probe difficult supine.   Will return patient to unit.  Recomm rescheduling with patient to wear  brace when she returns.  At present unable to contact  MD from care team to notify.  Dorris Carnes MD

## 2021-10-07 NOTE — Interval H&P Note (Signed)
History and Physical Interval Note:  10/07/2021 7:51 AM  Martha Campbell  has presented today for surgery, with the diagnosis of bacteremia.  The various methods of treatment have been discussed with the patient and family. After consideration of risks, benefits and other options for treatment, the patient has consented to  Procedure(s): TRANSESOPHAGEAL ECHOCARDIOGRAM (TEE) (N/A) as a surgical intervention.  The patient's history has been reviewed, patient examined, no change in status, stable for surgery.  I have reviewed the patient's chart and labs.  Questions were answered to the patient's satisfaction.     Dorris Carnes

## 2021-10-07 NOTE — Progress Notes (Addendum)
[facility] Infectious Diseases Follow Up Note  Patient Identification: Patient Name: Martha Campbell MRN: [id number] [place] Date: 09/29/2021  3:24 PM Age: 46 y.o.Today's Date: 10/07/2021   Reason for Visit: MSSA bacteremia   Active Problems:   Spinal compression fracture [facility])   Pathologic thoracic fracture   Fever   Sepsis due to methicillin susceptible Staphylococcus aureus (MSSA) with acute liver failure without hepatic coma or septic shock (HCC)   Antibiotics: cefazolin 110/14-current   Lines/Hardwares: PIVs   Interval Events:fever curve is improving with T max 100.1. WBC 5   Assessment MSSA bacteremia in the setting of infiltrated PIV ( possibly hospital acquired ) r/o endocarditis   Multiple acute/subacute and chronic spinal fractures  Epidural hematoma  -    Neurosurgery following and recommended conservative management   4.  Alcoholic Liver Disease with delirium tremens - improving  - Primary managing - Negative acute hepatitis panel and HIV   5. Lytic lesions in the tibia/fibula - work up for MM and malignancy per primary   Recommendations Continue cefazolin.  TEE pending  to r/o endocarditis given repeat blood cultures being positive Fu repeat blood cx for clearance  Monitor CBC and CMP Alcohol withdrawal management per primary  Rest of the management as per the primary team. Thank you for the consult. Please page with pertinent questions or concerns.  ______________________________________________________________________ Subjective patient seen and examined at the bedside.  She is in a cervical collar. Denies any worsening back pain. Complains of rt leg pain but ROM is good.  Vitals BP 111/74   Pulse 96   Temp 98 F (36.7 C) (Oral)   Resp 18   Ht 5\' 5"  (1.651 m)   Wt 59.8 kg   SpO2 99%   BMI 21.94 kg/m     Physical Exam Lying in bed, appears comfortable.  On C collar Bilateral upper  extremities PIV sites looks OK with no signs of phlebitis today  No peripheral joint swelling and tenderness Back unable to examine  Chest - no signs of respiratory distress CVS- Normal s1s2 RRR Neuro - awake, alert and oriented, moving all extremities freely   Pertinent Microbiology Results for orders placed or performed during the hospital encounter of 09/29/21  Resp Panel by RT-PCR (Flu A&B, Covid) Nasopharyngeal Swab     Status: None   Collection Time: 09/29/21  3:34 PM   Specimen: Nasopharyngeal Swab; Nasopharyngeal(NP) swabs in vial transport medium  Result Value Ref Range Status   SARS Coronavirus 2 by RT PCR NEGATIVE NEGATIVE Final    Comment: (NOTE) SARS-CoV-2 target nucleic acids are NOT DETECTED.  The SARS-CoV-2 RNA is generally detectable in upper respiratory specimens during the acute phase of infection. The lowest concentration of SARS-CoV-2 viral copies this assay can detect is 138 copies/mL. A negative result does not preclude SARS-Cov-2 infection and should not be used as the sole basis for treatment or other patient management decisions. A negative result may occur with  improper specimen collection/handling, submission of specimen other than nasopharyngeal swab, presence of viral mutation(s) within the areas targeted by this assay, and inadequate number of viral copies(<138 copies/mL). A negative result must be combined with clinical observations, patient history, and epidemiological information. The expected result is Negative.  Fact Sheet for Patients:  EntrepreneurPulse.com.au  Fact Sheet for Healthcare Providers:  IncredibleEmployment.be  This test is no t yet approved or cleared by the [place] FDA and  has been authorized for detection and/or diagnosis of SARS-CoV-2 by FDA under an  Emergency Use Authorization (EUA). This EUA will remain  in effect (meaning this test can be used) for the duration of the COVID-19  declaration under Section 564(b)(1) of the Act, 21 U.S.C.section 360bbb-3(b)(1), unless the authorization is terminated  or revoked sooner.       Influenza A by PCR NEGATIVE NEGATIVE Final   Influenza B by PCR NEGATIVE NEGATIVE Final    Comment: (NOTE) The Xpert Xpress SARS-CoV-2/FLU/RSV plus assay is intended as an aid in the diagnosis of influenza from Nasopharyngeal swab specimens and should not be used as a sole basis for treatment. Nasal washings and aspirates are unacceptable for Xpert Xpress SARS-CoV-2/FLU/RSV testing.  Fact Sheet for Patients: EntrepreneurPulse.com.au  Fact Sheet for Healthcare Providers: IncredibleEmployment.be  This test is not yet approved or cleared by the [place] FDA and has been authorized for detection and/or diagnosis of SARS-CoV-2 by FDA under an Emergency Use Authorization (EUA). This EUA will remain in effect (meaning this test can be used) for the duration of the COVID-19 declaration under Section 564(b)(1) of the Act, 21 U.S.C. section 360bbb-3(b)(1), unless the authorization is terminated or revoked.  Performed at [facility], [place] [address], [place], [place] [address]   Culture, blood (routine x 2)     Status: Abnormal   Collection Time: 10/02/21  6:44 PM   Specimen: BLOOD RIGHT ARM  Result Value Ref Range Status   Specimen Description BLOOD RIGHT ARM  Final   Special Requests   Final    BOTTLES DRAWN AEROBIC AND ANAEROBIC Blood Culture adequate volume   Culture  Setup Time   Final    GRAM POSITIVE COCCI IN BOTH AEROBIC AND ANAEROBIC BOTTLES CRITICAL RESULT CALLED TO, READ BACK BY AND VERIFIED WITH: PHARMD ALEX L [id number] FCP Performed at [facility], [place] [address], [place], [place] [address]    Culture STAPHYLOCOCCUS AUREUS (A)  Final   Report Status 10/05/2021 FINAL  Final   Organism ID, Bacteria STAPHYLOCOCCUS AUREUS  Final      Susceptibility   Staphylococcus aureus  - MIC*    CIPROFLOXACIN <=0.5 SENSITIVE Sensitive     ERYTHROMYCIN <=0.25 SENSITIVE Sensitive     GENTAMICIN <=0.5 SENSITIVE Sensitive     OXACILLIN 0.5 SENSITIVE Sensitive     TETRACYCLINE <=1 SENSITIVE Sensitive     VANCOMYCIN <=0.5 SENSITIVE Sensitive     TRIMETH/SULFA <=10 SENSITIVE Sensitive     CLINDAMYCIN <=0.25 SENSITIVE Sensitive     RIFAMPIN <=0.5 SENSITIVE Sensitive     Inducible Clindamycin NEGATIVE Sensitive     * STAPHYLOCOCCUS AUREUS  Culture, blood (routine x 2)     Status: Abnormal   Collection Time: 10/02/21  6:44 PM   Specimen: BLOOD RIGHT ARM  Result Value Ref Range Status   Specimen Description BLOOD RIGHT ARM  Final   Special Requests   Final    BOTTLES DRAWN AEROBIC AND ANAEROBIC Blood Culture adequate volume   Culture  Setup Time   Final    GRAM POSITIVE COCCI IN BOTH AEROBIC AND ANAEROBIC BOTTLES CRITICAL VALUE NOTED.  VALUE IS CONSISTENT WITH PREVIOUSLY REPORTED AND CALLED VALUE.    Culture (A)  Final    STAPHYLOCOCCUS AUREUS SUSCEPTIBILITIES PERFORMED ON PREVIOUS CULTURE WITHIN THE LAST 5 DAYS. Performed at [facility], [place] [address], [place], [place] [address]    Report Status 10/05/2021 FINAL  Final  Blood Culture ID Panel (Reflexed)     Status: Abnormal   Collection Time: 10/02/21  6:44  PM  Result Value Ref Range Status   Enterococcus faecalis NOT DETECTED NOT DETECTED Final   Enterococcus Faecium NOT DETECTED NOT DETECTED Final   Listeria monocytogenes NOT DETECTED NOT DETECTED Final   Staphylococcus species DETECTED (A) NOT DETECTED Final    Comment: CRITICAL RESULT CALLED TO, READ BACK BY AND VERIFIED WITH: A,LAWLESS PHARMD @1020  10/03/21 FCP    Staphylococcus aureus (BCID) DETECTED (A) NOT DETECTED Final    Comment: CRITICAL RESULT CALLED TO, READ BACK BY AND VERIFIED WITH: A,LAWLESS PHARMD @1020  10/03/21 FCP    Staphylococcus epidermidis NOT DETECTED NOT DETECTED Final   Staphylococcus lugdunensis NOT DETECTED NOT DETECTED Final    Streptococcus species NOT DETECTED NOT DETECTED Final   Streptococcus agalactiae NOT DETECTED NOT DETECTED Final   Streptococcus pneumoniae NOT DETECTED NOT DETECTED Final   Streptococcus pyogenes NOT DETECTED NOT DETECTED Final   A.calcoaceticus-baumannii NOT DETECTED NOT DETECTED Final   Bacteroides fragilis NOT DETECTED NOT DETECTED Final   Enterobacterales NOT DETECTED NOT DETECTED Final   Enterobacter cloacae complex NOT DETECTED NOT DETECTED Final   Escherichia coli NOT DETECTED NOT DETECTED Final   Klebsiella aerogenes NOT DETECTED NOT DETECTED Final   Klebsiella oxytoca NOT DETECTED NOT DETECTED Final   Klebsiella pneumoniae NOT DETECTED NOT DETECTED Final   Proteus species NOT DETECTED NOT DETECTED Final   Salmonella species NOT DETECTED NOT DETECTED Final   Serratia marcescens NOT DETECTED NOT DETECTED Final   Haemophilus influenzae NOT DETECTED NOT DETECTED Final   Neisseria meningitidis NOT DETECTED NOT DETECTED Final   Pseudomonas aeruginosa NOT DETECTED NOT DETECTED Final   Stenotrophomonas maltophilia NOT DETECTED NOT DETECTED Final   Candida albicans NOT DETECTED NOT DETECTED Final   Candida auris NOT DETECTED NOT DETECTED Final   Candida glabrata NOT DETECTED NOT DETECTED Final   Candida krusei NOT DETECTED NOT DETECTED Final   Candida parapsilosis NOT DETECTED NOT DETECTED Final   Candida tropicalis NOT DETECTED NOT DETECTED Final   Cryptococcus neoformans/gattii NOT DETECTED NOT DETECTED Final   Meth resistant mecA/C and MREJ NOT DETECTED NOT DETECTED Final    Comment: Performed at [facility], [address] [place], [place] [address]  MRSA Next Gen by PCR, Nasal     Status: None   Collection Time: 10/02/21  8:34 PM   Specimen: Nasal Mucosa; Nasal Swab  Result Value Ref Range Status   MRSA by PCR Next Gen NOT DETECTED NOT DETECTED Final    Comment: (NOTE) The GeneXpert MRSA Assay (FDA approved for NASAL specimens only), is one component of a  comprehensive MRSA colonization surveillance program. It is not intended to diagnose MRSA infection nor to guide or monitor treatment for MRSA infections. Test performance is not FDA approved in patients less than 89 years old. Performed at [facility], [place] [address] [place], [place] [address]   Urine Culture     Status: None   Collection Time: 10/03/21 10:35 AM   Specimen: Urine, Clean Catch  Result Value Ref Range Status   Specimen Description URINE, CLEAN CATCH  Final   Special Requests NONE  Final   Culture   Final    NO GROWTH Performed at [facility], [place] [address] [place], [place] [address]    Report Status 10/04/2021 FINAL  Final  Culture, blood (routine x 2)     Status: Abnormal   Collection Time: 10/03/21 10:41 AM   Specimen: BLOOD  Result Value Ref Range Status   Specimen Description  BLOOD LEFT ANTECUBITAL  Final   Special Requests   Final    BOTTLES DRAWN AEROBIC AND ANAEROBIC Blood Culture adequate volume   Culture  Setup Time   Final    GRAM POSITIVE COCCI IN CLUSTERS IN BOTH AEROBIC AND ANAEROBIC BOTTLES CRITICAL VALUE NOTED.  VALUE IS CONSISTENT WITH PREVIOUSLY REPORTED AND CALLED VALUE.    Culture (A)  Final    STAPHYLOCOCCUS AUREUS SUSCEPTIBILITIES PERFORMED ON PREVIOUS CULTURE WITHIN THE LAST 5 DAYS. Performed at [facility], [place] [address], [place], [place] [address]    Report Status 10/05/2021 FINAL  Final  Culture, blood (routine x 2)     Status: Abnormal   Collection Time: 10/03/21 10:52 AM   Specimen: BLOOD LEFT HAND  Result Value Ref Range Status   Specimen Description BLOOD LEFT HAND  Final   Special Requests   Final    BOTTLES DRAWN AEROBIC AND ANAEROBIC Blood Culture results may not be optimal due to an inadequate volume of blood received in culture bottles   Culture  Setup Time   Final    IN BOTH AEROBIC AND ANAEROBIC BOTTLES GRAM POSITIVE COCCI IN CLUSTERS CRITICAL VALUE NOTED.  VALUE IS CONSISTENT WITH PREVIOUSLY  REPORTED AND CALLED VALUE.    Culture (A)  Final    STAPHYLOCOCCUS AUREUS SUSCEPTIBILITIES PERFORMED ON PREVIOUS CULTURE WITHIN THE LAST 5 DAYS. Performed at [facility], [place] [address] [place], [place] [address]    Report Status 10/05/2021 FINAL  Final  Culture, blood (routine x 2)     Status: Abnormal   Collection Time: 10/04/21  4:51 AM   Specimen: BLOOD  Result Value Ref Range Status   Specimen Description BLOOD LEFT ANTECUBITAL  Final   Special Requests   Final    AEROBIC BOTTLE ONLY Blood Culture results may not be optimal due to an excessive volume of blood received in culture bottles   Culture  Setup Time   Final    GRAM POSITIVE COCCI IN CLUSTERS AEROBIC BOTTLE ONLY CRITICAL VALUE NOTED.  VALUE IS CONSISTENT WITH PREVIOUSLY REPORTED AND CALLED VALUE.    Culture (A)  Final    STAPHYLOCOCCUS AUREUS SUSCEPTIBILITIES PERFORMED ON PREVIOUS CULTURE WITHIN THE LAST 5 DAYS. Performed at [facility], [place] [address], [place], [place] [address]    Report Status 10/06/2021 FINAL  Final  Culture, blood (routine x 2)     Status: Abnormal   Collection Time: 10/04/21  4:51 AM   Specimen: BLOOD LEFT FOREARM  Result Value Ref Range Status   Specimen Description BLOOD LEFT FOREARM  Final   Special Requests AEROBIC BOTTLE ONLY Blood Culture adequate volume  Final   Culture  Setup Time   Final    GRAM POSITIVE COCCI IN CLUSTERS AEROBIC BOTTLE ONLY CRITICAL VALUE NOTED.  VALUE IS CONSISTENT WITH PREVIOUSLY REPORTED AND CALLED VALUE.    Culture (A)  Final    STAPHYLOCOCCUS AUREUS SUSCEPTIBILITIES PERFORMED ON PREVIOUS CULTURE WITHIN THE LAST 5 DAYS. Performed at [facility], [place] [address] [place], [place] [address]    Report Status 10/06/2021 FINAL  Final  Culture, blood (routine x 2)     Status: None (Preliminary result)   Collection Time: 10/06/21  5:50 AM   Specimen: BLOOD  Result Value Ref Range Status   Specimen Description BLOOD LEFT ARM  Final    Special Requests   Final    BOTTLES DRAWN AEROBIC AND ANAEROBIC Blood Culture adequate volume   Culture  Final    NO GROWTH 1 DAY Performed at [facility], [place] [address], [place], [place] [address]    Report Status PENDING  Incomplete  Culture, blood (routine x 2)     Status: None (Preliminary result)   Collection Time: 10/06/21  5:57 AM   Specimen: BLOOD LEFT HAND  Result Value Ref Range Status   Specimen Description BLOOD LEFT HAND  Final   Special Requests   Final    BOTTLES DRAWN AEROBIC AND ANAEROBIC Blood Culture adequate volume   Culture   Final    NO GROWTH 1 DAY Performed at [facility], [place] [address] [place], [place] [address]    Report Status PENDING  Incomplete     Pertinent Lab. CBC Latest Ref Rng & Units 10/07/2021 10/06/2021 10/05/2021  WBC 4.0 - 10.5 K/uL 5.3 2.8(L) 3.3(L)  Hemoglobin 12.0 - 15.0 g/dL 11.4(L) 11.0(L) 11.0(L)  Hematocrit 36.0 - 46.0 % 32.7(L) 32.6(L) 31.0(L)  Platelets 150 - 400 K/uL 356 277 197   CMP Latest Ref Rng & Units 10/07/2021 10/06/2021 10/05/2021  Glucose 70 - 99 mg/dL 96 102(H) 92  BUN 6 - 20 mg/dL <5(L) 6 7  Creatinine 0.44 - 1.00 mg/dL 0.41(L) 0.56 0.52  Sodium 135 - 145 mmol/L 133(L) 131(L) 131(L)  Potassium 3.5 - 5.1 mmol/L 3.7 3.1(L) 3.2(L)  Chloride 98 - 111 mmol/L 100 100 103  CO2 22 - 32 mmol/L 24 23 22   Calcium 8.9 - 10.3 mg/dL 8.5(L) 8.6(L) 8.4(L)  Total Protein 6.5 - 8.1 g/dL - 5.4(L) 5.3(L)  Total Bilirubin 0.3 - 1.2 mg/dL - 0.7 0.8  Alkaline Phos 38 - 126 U/L - 155(H) 124  AST 15 - 41 U/L - 49(H) 90(H)  ALT 0 - 44 U/L - 65(H) 120(H)     Pertinent Imaging today Plain films and CT images have been personally visualized and interpreted; radiology reports have been reviewed. Decision making incorporated into the Impression / Recommendations.  [place] RUQ 0/16/22 IMPRESSION: 1. Nonspecific trace pericholecystic fluid. Gallbladder is otherwise unremarkable.   2. Slight increase in liver parenchymal  echogenicity which is nonspecific but most commonly seen with hepatic steatosis. Visualization of the left hepatic lobe limited by shadowing bowel gas.      I spent more than 35 minutes for this patient encounter including review of prior medical records, coordination of care  with greater than 50% of time being face to face/counseling and discussing diagnostics/treatment plan with the patient/family.  Electronically signed by:   Rosiland Oz, MD Infectious Disease Physician [facility] Pager: [phone]

## 2021-10-07 NOTE — Progress Notes (Signed)
Patient ID: Martha Campbell, female   DOB: July 01, 1976, 45 y.o.   MRN: [id number] BP 110/85 (BP Location: Left Arm)   Pulse 92   Temp 98.2 F (36.8 C) (Oral)   Resp 20   Ht 5\' 5"  (1.651 m)   Wt 59.8 kg   SpO2 93%   BMI 21.94 kg/m  Alert and oriented, x 4 speech is clear Is bacteremic, mssa. On abx Needs brace cervical and lumbar on.  No neurological changes No new neurosurgical recommendations, at this time
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
## 2021-10-08 DIAGNOSIS — M79605 Pain in left leg: Secondary | ICD-10-CM

## 2021-10-08 LAB — BASIC METABOLIC PANEL
Anion gap: 9 (ref 5–15)
Anion gap: 10 (ref 5–15)
Anion gap: 8 (ref 5–15)
Anion gap: 9 (ref 5–15)
BUN: <5 mg/dL — ABNORMAL LOW (ref 6–20)
BUN: <5 mg/dL — ABNORMAL LOW (ref 6–20)
BUN: 5 mg/dL — ABNORMAL LOW (ref 6–20)
BUN: 5 mg/dL — ABNORMAL LOW (ref 6–20)
CO2: 25 mmol/L (ref 22–32)
CO2: 23 mmol/L (ref 22–32)
CO2: 23 mmol/L (ref 22–32)
CO2: 23 mmol/L (ref 22–32)
Calcium: 8.5 mg/dL — ABNORMAL LOW (ref 8.9–10.3)
Calcium: 8.7 mg/dL — ABNORMAL LOW (ref 8.9–10.3)
Calcium: 8.6 mg/dL — ABNORMAL LOW (ref 8.9–10.3)
Calcium: 8.7 mg/dL — ABNORMAL LOW (ref 8.9–10.3)
Chloride: 100 mmol/L (ref 98–111)
Chloride: 101 mmol/L (ref 98–111)
Chloride: 102 mmol/L (ref 98–111)
Chloride: 102 mmol/L (ref 98–111)
Creatinine, Ser: 0.5 mg/dL (ref 0.44–1.00)
Creatinine, Ser: 0.44 mg/dL (ref 0.44–1.00)
Creatinine, Ser: 0.43 mg/dL — ABNORMAL LOW (ref 0.44–1.00)
Creatinine, Ser: 0.37 mg/dL — ABNORMAL LOW (ref 0.44–1.00)
GFR, Estimated: >60 mL/min (ref >60)
GFR, Estimated: >60 mL/min (ref >60)
GFR, Estimated: >60 mL/min (ref >60)
GFR, Estimated: >60 mL/min (ref >60)
Glucose, Bld: 105 mg/dL — ABNORMAL HIGH (ref 70–99)
Glucose, Bld: 100 mg/dL — ABNORMAL HIGH (ref 70–99)
Glucose, Bld: 98 mg/dL (ref 70–99)
Glucose, Bld: 92 mg/dL (ref 70–99)
Potassium: 2.9 mmol/L — ABNORMAL LOW (ref 3.5–5.1)
Potassium: 3.9 mmol/L (ref 3.5–5.1)
Potassium: 4.4 mmol/L (ref 3.5–5.1)
Potassium: 4.3 mmol/L (ref 3.5–5.1)
Sodium: 134 mmol/L — ABNORMAL LOW (ref 135–145)
Sodium: 134 mmol/L — ABNORMAL LOW (ref 135–145)
Sodium: 133 mmol/L — ABNORMAL LOW (ref 135–145)
Sodium: 134 mmol/L — ABNORMAL LOW (ref 135–145)

## 2021-10-08 LAB — CBC WITH DIFFERENTIAL/PLATELET
Abs Immature Granulocytes: 0 10*3/uL (ref 0.00–0.07)
Basophils Absolute: 0 10*3/uL (ref 0.0–0.1)
Basophils Relative: 0 %
Eosinophils Absolute: 0.3 10*3/uL (ref 0.0–0.5)
Eosinophils Relative: 4 %
HCT: 34.7 % — ABNORMAL LOW (ref 36.0–46.0)
Hemoglobin: 12.1 g/dL (ref 12.0–15.0)
Lymphocytes Relative: 24 %
Lymphs Abs: 1.5 10*3/uL (ref 0.7–4.0)
MCH: 33.7 pg (ref 26.0–34.0)
MCHC: 34.9 g/dL (ref 30.0–36.0)
MCV: 96.7 fL (ref 80.0–100.0)
Monocytes Absolute: 0.8 10*3/uL (ref 0.1–1.0)
Monocytes Relative: 12 %
Neutro Abs: 3.8 10*3/uL (ref 1.7–7.7)
Neutrophils Relative %: 60 %
Platelets: 514 10*3/uL — ABNORMAL HIGH (ref 150–400)
RBC: 3.59 MIL/uL — ABNORMAL LOW (ref 3.87–5.11)
RDW: 15.9 % — ABNORMAL HIGH (ref 11.5–15.5)
WBC: 6.3 10*3/uL (ref 4.0–10.5)
nRBC: 0 % (ref 0.0–0.2)
nRBC: 0 /100 WBC

## 2021-10-08 LAB — COMPREHENSIVE METABOLIC PANEL
ALT: 43 U/L (ref 0–44)
AST: 47 U/L — ABNORMAL HIGH (ref 15–41)
Albumin: 2.2 g/dL — ABNORMAL LOW (ref 3.5–5.0)
Alkaline Phosphatase: 241 U/L — ABNORMAL HIGH (ref 38–126)
Anion gap: 9 (ref 5–15)
BUN: <5 mg/dL — ABNORMAL LOW (ref 6–20)
CO2: 22 mmol/L (ref 22–32)
Calcium: 8.2 mg/dL — ABNORMAL LOW (ref 8.9–10.3)
Chloride: 96 mmol/L — ABNORMAL LOW (ref 98–111)
Creatinine, Ser: 0.42 mg/dL — ABNORMAL LOW (ref 0.44–1.00)
GFR, Estimated: >60 mL/min (ref >60)
Glucose, Bld: 94 mg/dL (ref 70–99)
Potassium: 2.8 mmol/L — ABNORMAL LOW (ref 3.5–5.1)
Sodium: 127 mmol/L — ABNORMAL LOW (ref 135–145)
Total Bilirubin: 0.7 mg/dL (ref 0.3–1.2)
Total Protein: 5.9 g/dL — ABNORMAL LOW (ref 6.5–8.1)

## 2021-10-08 LAB — MAGNESIUM: Magnesium: 1.8 mg/dL (ref 1.7–2.4)

## 2021-10-08 LAB — OSMOLALITY: Osmolality: 272 mOsm/kg — ABNORMAL LOW (ref 275–295)

## 2021-10-08 MED ORDER — POTASSIUM CHLORIDE 10 MEQ/100ML IV SOLN
10.0000 meq | INTRAVENOUS | Status: AC
  Administered 2021-10-08 (×4): 10 meq via INTRAVENOUS
  Filled 2021-10-08 (×4): qty 100

## 2021-10-08 MED ORDER — CEFAZOLIN SODIUM-DEXTROSE 2-4 GM/100ML-% IV SOLN
2.0000 g | Freq: Three times a day (TID) | INTRAVENOUS | Status: DC
  Administered 2021-10-08 – 2021-10-14 (×19): 2 g via INTRAVENOUS
  Filled 2021-10-08 (×20): qty 100

## 2021-10-08 MED ORDER — POTASSIUM CHLORIDE CRYS ER 20 MEQ PO TBCR
40.0000 meq | EXTENDED_RELEASE_TABLET | Freq: Two times a day (BID) | ORAL | Status: DC
  Administered 2021-10-08: 40 meq via ORAL
  Filled 2021-10-08: qty 2
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
## 2021-10-08 NOTE — Progress Notes (Signed)
Patient ID: Martha Campbell, female   DOB: 13-Dec-1976, 45 y.o.   MRN: [id number] BP 95/72   Pulse 95   Temp 98.7 F (37.1 C) (Oral)   Resp 15   Ht 5\' 5"  (1.651 m)   Wt 59.8 kg   SpO2 91%   BMI 21.94 kg/m  Alert and oriented x 4 Speech is clear and fluent Moving all extremities Brace on when out of bed.
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
## 2021-10-08 NOTE — Progress Notes (Addendum)
I have seen and examined the patient. I have personally reviewed the clinical findings, laboratory findings, microbiological data and imaging studies. The assessment and treatment plan was discussed with the Nurse Practitioner Mauricio Po. I agree with her/his recommendations except following additions/corrections.  Fevers has resolved , no leukocytosis Stable neck and back pain. Complains of left leg pain but has full ROM in left hip/knee and ankle with no signs of septic joint Few episodes of loose stool  TEE pending ( need to be rescheduled ) No peripheral joint swelling and tenderness  Continue cefazolin  Fu blood cultures  Fu TEE  Monitor CBC and BMP Following   Rosiland Oz, MD Infectious Disease Physician [facility] for Infectious Disease [address] [place], [place] [address] Phone: [phone]  Fax: [place] for Infectious Disease  Date of Admission:  09/29/2021     Total days of antibiotics 6         ASSESSMENT:  Ms. Yeung blood cultures from 10/06/21 remain without growth to date which is encouraging as she has had persistent bacteremia for a couple of days. TEE unable to be completed and will need to be rescheduled. Appears to be tolerating Cefazolin without complication. Continue current dose of Cefazolin and final recommendations pending completion of TEE if able to be done. ID will continue to follow.   PLAN:  Continue current dose of Cefazolin. Monitor blood cultures for clearance of bacteremia TEE when/if able. Remaining care per primary team.  ID will continue to follow.   Active Problems:   Spinal compression fracture [facility])   Pathologic thoracic fracture   Fever   Sepsis due to methicillin susceptible Staphylococcus aureus (MSSA) with acute liver failure without hepatic coma or septic shock (HCC)    Chlorhexidine Gluconate Cloth  6 each Topical Daily   folic acid  1 mg Oral Daily    multivitamin with minerals  1 tablet Oral Daily   thiamine  100 mg Oral Daily    SUBJECTIVE:  Afebrile overnight with no acute events. More sore today from moving around yesterday. Unable to complete TEE.   Allergies  Allergen Reactions   Penicillins Hives, Nausea And Vomiting and Swelling    fever     Review of Systems: Review of Systems  Constitutional:  Negative for chills, fever and weight loss.  Respiratory:  Negative for cough, shortness of breath and wheezing.   Cardiovascular:  Negative for chest pain and leg swelling.  Gastrointestinal:  Negative for abdominal pain, constipation, diarrhea, nausea and vomiting.  Musculoskeletal:  Positive for back pain and neck pain.  Skin:  Negative for rash.     OBJECTIVE: Vitals:   10/07/21 1948 10/07/21 2339 10/08/21 0342 10/08/21 0717  BP:  (!) 88/70 100/73 98/73  Pulse:  96 97 93  Resp:   18 16  Temp: 99 F (37.2 C)  98.9 F (37.2 C) 97.8 F (36.6 C)  TempSrc: Oral  Oral Oral  SpO2:   91% 94%  Weight:      Height:       Body mass index is 21.94 kg/m.  Physical Exam Constitutional:      General: She is not in acute distress.    Appearance: She is well-developed.     Interventions: Cervical collar in place.  Cardiovascular:     Rate and Rhythm: Normal rate and regular rhythm.     Heart sounds: Normal heart sounds.  Pulmonary:     Effort:  Pulmonary effort is normal.     Breath sounds: Normal breath sounds.  Skin:    General: Skin is warm and dry.  Neurological:     Mental Status: She is alert and oriented to person, place, and time.  Psychiatric:        Behavior: Behavior normal.        Thought Content: Thought content normal.        Judgment: Judgment normal.    Lab Results Lab Results  Component Value Date   WBC 6.3 10/08/2021   HGB 12.1 10/08/2021   HCT 34.7 (L) 10/08/2021   MCV 96.7 10/08/2021   PLT 514 (H) 10/08/2021    Lab Results  Component Value Date   CREATININE 0.42 (L) 10/08/2021   BUN  <5 (L) 10/08/2021   NA 127 (L) 10/08/2021   K 2.8 (L) 10/08/2021   CL 96 (L) 10/08/2021   CO2 22 10/08/2021    Lab Results  Component Value Date   ALT 43 10/08/2021   AST 47 (H) 10/08/2021   ALKPHOS 241 (H) 10/08/2021   BILITOT 0.7 10/08/2021     Microbiology: Recent Results (from the past 240 hour(s))  Resp Panel by RT-PCR (Flu A&B, Covid) Nasopharyngeal Swab     Status: None   Collection Time: 09/29/21  3:34 PM   Specimen: Nasopharyngeal Swab; Nasopharyngeal(NP) swabs in vial transport medium  Result Value Ref Range Status   SARS Coronavirus 2 by RT PCR NEGATIVE NEGATIVE Final    Comment: (NOTE) SARS-CoV-2 target nucleic acids are NOT DETECTED.  The SARS-CoV-2 RNA is generally detectable in upper respiratory specimens during the acute phase of infection. The lowest concentration of SARS-CoV-2 viral copies this assay can detect is 138 copies/mL. A negative result does not preclude SARS-Cov-2 infection and should not be used as the sole basis for treatment or other patient management decisions. A negative result may occur with  improper specimen collection/handling, submission of specimen other than nasopharyngeal swab, presence of viral mutation(s) within the areas targeted by this assay, and inadequate number of viral copies(<138 copies/mL). A negative result must be combined with clinical observations, patient history, and epidemiological information. The expected result is Negative.  Fact Sheet for Patients:  EntrepreneurPulse.com.au  Fact Sheet for Healthcare Providers:  IncredibleEmployment.be  This test is no t yet approved or cleared by the [place] FDA and  has been authorized for detection and/or diagnosis of SARS-CoV-2 by FDA under an Emergency Use Authorization (EUA). This EUA will remain  in effect (meaning this test can be used) for the duration of the COVID-19 declaration under Section 564(b)(1) of the Act,  21 U.S.C.section 360bbb-3(b)(1), unless the authorization is terminated  or revoked sooner.       Influenza A by PCR NEGATIVE NEGATIVE Final   Influenza B by PCR NEGATIVE NEGATIVE Final    Comment: (NOTE) The Xpert Xpress SARS-CoV-2/FLU/RSV plus assay is intended as an aid in the diagnosis of influenza from Nasopharyngeal swab specimens and should not be used as a sole basis for treatment. Nasal washings and aspirates are unacceptable for Xpert Xpress SARS-CoV-2/FLU/RSV testing.  Fact Sheet for Patients: EntrepreneurPulse.com.au  Fact Sheet for Healthcare Providers: IncredibleEmployment.be  This test is not yet approved or cleared by the [place] FDA and has been authorized for detection and/or diagnosis of SARS-CoV-2 by FDA under an Emergency Use Authorization (EUA). This EUA will remain in effect (meaning this test can be used) for the duration of the COVID-19 declaration under Section 564(b)(1) of  the Act, 21 U.S.C. section 360bbb-3(b)(1), unless the authorization is terminated or revoked.  Performed at [facility], [place] [address] [place], [place] [address]   Culture, blood (routine x 2)     Status: Abnormal   Collection Time: 10/02/21  6:44 PM   Specimen: BLOOD RIGHT ARM  Result Value Ref Range Status   Specimen Description BLOOD RIGHT ARM  Final   Special Requests   Final    BOTTLES DRAWN AEROBIC AND ANAEROBIC Blood Culture adequate volume   Culture  Setup Time   Final    GRAM POSITIVE COCCI IN BOTH AEROBIC AND ANAEROBIC BOTTLES CRITICAL RESULT CALLED TO, READ BACK BY AND VERIFIED WITH: PHARMD ALEX L [id number] [id number] FCP Performed at [facility], [place] [address] [place], [place] [address]    Culture STAPHYLOCOCCUS AUREUS (A)  Final   Report Status 10/05/2021 FINAL  Final   Organism ID, Bacteria STAPHYLOCOCCUS AUREUS  Final      Susceptibility   Staphylococcus aureus - MIC*    CIPROFLOXACIN <=0.5 SENSITIVE  Sensitive     ERYTHROMYCIN <=0.25 SENSITIVE Sensitive     GENTAMICIN <=0.5 SENSITIVE Sensitive     OXACILLIN 0.5 SENSITIVE Sensitive     TETRACYCLINE <=1 SENSITIVE Sensitive     VANCOMYCIN <=0.5 SENSITIVE Sensitive     TRIMETH/SULFA <=10 SENSITIVE Sensitive     CLINDAMYCIN <=0.25 SENSITIVE Sensitive     RIFAMPIN <=0.5 SENSITIVE Sensitive     Inducible Clindamycin NEGATIVE Sensitive     * STAPHYLOCOCCUS AUREUS  Culture, blood (routine x 2)     Status: Abnormal   Collection Time: 10/02/21  6:44 PM   Specimen: BLOOD RIGHT ARM  Result Value Ref Range Status   Specimen Description BLOOD RIGHT ARM  Final   Special Requests   Final    BOTTLES DRAWN AEROBIC AND ANAEROBIC Blood Culture adequate volume   Culture  Setup Time   Final    GRAM POSITIVE COCCI IN BOTH AEROBIC AND ANAEROBIC BOTTLES CRITICAL VALUE NOTED.  VALUE IS CONSISTENT WITH PREVIOUSLY REPORTED AND CALLED VALUE.    Culture (A)  Final    STAPHYLOCOCCUS AUREUS SUSCEPTIBILITIES PERFORMED ON PREVIOUS CULTURE WITHIN THE LAST 5 DAYS. Performed at [facility], [place] [address], [place], [place] [address]    Report Status 10/05/2021 FINAL  Final  Blood Culture ID Panel (Reflexed)     Status: Abnormal   Collection Time: 10/02/21  6:44 PM  Result Value Ref Range Status   Enterococcus faecalis NOT DETECTED NOT DETECTED Final   Enterococcus Faecium NOT DETECTED NOT DETECTED Final   Listeria monocytogenes NOT DETECTED NOT DETECTED Final   Staphylococcus species DETECTED (A) NOT DETECTED Final    Comment: CRITICAL RESULT CALLED TO, READ BACK BY AND VERIFIED WITH: A,LAWLESS PHARMD @1020  10/03/21 FCP    Staphylococcus aureus (BCID) DETECTED (A) NOT DETECTED Final    Comment: CRITICAL RESULT CALLED TO, READ BACK BY AND VERIFIED WITH: A,LAWLESS PHARMD @1020  10/03/21 FCP    Staphylococcus epidermidis NOT DETECTED NOT DETECTED Final   Staphylococcus lugdunensis NOT DETECTED NOT DETECTED Final   Streptococcus species NOT DETECTED NOT  DETECTED Final   Streptococcus agalactiae NOT DETECTED NOT DETECTED Final   Streptococcus pneumoniae NOT DETECTED NOT DETECTED Final   Streptococcus pyogenes NOT DETECTED NOT DETECTED Final   A.calcoaceticus-baumannii NOT DETECTED NOT DETECTED Final   Bacteroides fragilis NOT DETECTED NOT DETECTED Final   Enterobacterales NOT DETECTED NOT DETECTED Final   Enterobacter cloacae complex NOT DETECTED  NOT DETECTED Final   Escherichia coli NOT DETECTED NOT DETECTED Final   Klebsiella aerogenes NOT DETECTED NOT DETECTED Final   Klebsiella oxytoca NOT DETECTED NOT DETECTED Final   Klebsiella pneumoniae NOT DETECTED NOT DETECTED Final   Proteus species NOT DETECTED NOT DETECTED Final   Salmonella species NOT DETECTED NOT DETECTED Final   Serratia marcescens NOT DETECTED NOT DETECTED Final   Haemophilus influenzae NOT DETECTED NOT DETECTED Final   Neisseria meningitidis NOT DETECTED NOT DETECTED Final   Pseudomonas aeruginosa NOT DETECTED NOT DETECTED Final   Stenotrophomonas maltophilia NOT DETECTED NOT DETECTED Final   Candida albicans NOT DETECTED NOT DETECTED Final   Candida auris NOT DETECTED NOT DETECTED Final   Candida glabrata NOT DETECTED NOT DETECTED Final   Candida krusei NOT DETECTED NOT DETECTED Final   Candida parapsilosis NOT DETECTED NOT DETECTED Final   Candida tropicalis NOT DETECTED NOT DETECTED Final   Cryptococcus neoformans/gattii NOT DETECTED NOT DETECTED Final   Meth resistant mecA/C and MREJ NOT DETECTED NOT DETECTED Final    Comment: Performed at [facility], [place] [address], [place], [place] [address]  MRSA Next Gen by PCR, Nasal     Status: None   Collection Time: 10/02/21  8:34 PM   Specimen: Nasal Mucosa; Nasal Swab  Result Value Ref Range Status   MRSA by PCR Next Gen NOT DETECTED NOT DETECTED Final    Comment: (NOTE) The GeneXpert MRSA Assay (FDA approved for NASAL specimens only), is one component of a comprehensive MRSA colonization  surveillance program. It is not intended to diagnose MRSA infection nor to guide or monitor treatment for MRSA infections. Test performance is not FDA approved in patients less than 46 years old. Performed at [facility], [place] [address], [place], [place] [address]   Urine Culture     Status: None   Collection Time: 10/03/21 10:35 AM   Specimen: Urine, Clean Catch  Result Value Ref Range Status   Specimen Description URINE, CLEAN CATCH  Final   Special Requests NONE  Final   Culture   Final    NO GROWTH Performed at [facility], [place] [address] [place], [place] [address]    Report Status 10/04/2021 FINAL  Final  Culture, blood (routine x 2)     Status: Abnormal   Collection Time: 10/03/21 10:41 AM   Specimen: BLOOD  Result Value Ref Range Status   Specimen Description BLOOD LEFT ANTECUBITAL  Final   Special Requests   Final    BOTTLES DRAWN AEROBIC AND ANAEROBIC Blood Culture adequate volume   Culture  Setup Time   Final    GRAM POSITIVE COCCI IN CLUSTERS IN BOTH AEROBIC AND ANAEROBIC BOTTLES CRITICAL VALUE NOTED.  VALUE IS CONSISTENT WITH PREVIOUSLY REPORTED AND CALLED VALUE.    Culture (A)  Final    STAPHYLOCOCCUS AUREUS SUSCEPTIBILITIES PERFORMED ON PREVIOUS CULTURE WITHIN THE LAST 5 DAYS. Performed at [facility], [place] [address], [place], [place] [address]    Report Status 10/05/2021 FINAL  Final  Culture, blood (routine x 2)     Status: Abnormal   Collection Time: 10/03/21 10:52 AM   Specimen: BLOOD LEFT HAND  Result Value Ref Range Status   Specimen Description BLOOD LEFT HAND  Final   Special Requests   Final    BOTTLES DRAWN AEROBIC AND ANAEROBIC Blood Culture results may not be optimal due to an inadequate volume of blood received in culture bottles   Culture  Setup Time   Final    IN BOTH AEROBIC AND ANAEROBIC BOTTLES GRAM POSITIVE COCCI IN CLUSTERS CRITICAL VALUE NOTED.  VALUE IS CONSISTENT WITH PREVIOUSLY REPORTED AND CALLED VALUE.     Culture (A)  Final    STAPHYLOCOCCUS AUREUS SUSCEPTIBILITIES PERFORMED ON PREVIOUS CULTURE WITHIN THE LAST 5 DAYS. Performed at [facility], [place] [address] [place], [place] [address]    Report Status 10/05/2021 FINAL  Final  Culture, blood (routine x 2)     Status: Abnormal   Collection Time: 10/04/21  4:51 AM   Specimen: BLOOD  Result Value Ref Range Status   Specimen Description BLOOD LEFT ANTECUBITAL  Final   Special Requests   Final    AEROBIC BOTTLE ONLY Blood Culture results may not be optimal due to an excessive volume of blood received in culture bottles   Culture  Setup Time   Final    GRAM POSITIVE COCCI IN CLUSTERS AEROBIC BOTTLE ONLY CRITICAL VALUE NOTED.  VALUE IS CONSISTENT WITH PREVIOUSLY REPORTED AND CALLED VALUE.    Culture (A)  Final    STAPHYLOCOCCUS AUREUS SUSCEPTIBILITIES PERFORMED ON PREVIOUS CULTURE WITHIN THE LAST 5 DAYS. Performed at [facility], [place] [address] [place], [place] [address]    Report Status 10/06/2021 FINAL  Final  Culture, blood (routine x 2)     Status: Abnormal   Collection Time: 10/04/21  4:51 AM   Specimen: BLOOD LEFT FOREARM  Result Value Ref Range Status   Specimen Description BLOOD LEFT FOREARM  Final   Special Requests AEROBIC BOTTLE ONLY Blood Culture adequate volume  Final   Culture  Setup Time   Final    GRAM POSITIVE COCCI IN CLUSTERS AEROBIC BOTTLE ONLY CRITICAL VALUE NOTED.  VALUE IS CONSISTENT WITH PREVIOUSLY REPORTED AND CALLED VALUE.    Culture (A)  Final    STAPHYLOCOCCUS AUREUS SUSCEPTIBILITIES PERFORMED ON PREVIOUS CULTURE WITHIN THE LAST 5 DAYS. Performed at [facility], [place] [address] [place], [place] [address]    Report Status 10/06/2021 FINAL  Final  Culture, blood (routine x 2)     Status: None (Preliminary result)   Collection Time: 10/06/21  5:50 AM   Specimen: BLOOD  Result Value Ref Range Status   Specimen Description BLOOD LEFT ARM  Final   Special Requests   Final    BOTTLES  DRAWN AEROBIC AND ANAEROBIC Blood Culture adequate volume   Culture   Final    NO GROWTH 2 DAYS Performed at [facility], [place] [address] [place], [place] [address]    Report Status PENDING  Incomplete  Culture, blood (routine x 2)     Status: None (Preliminary result)   Collection Time: 10/06/21  5:57 AM   Specimen: BLOOD LEFT HAND  Result Value Ref Range Status   Specimen Description BLOOD LEFT HAND  Final   Special Requests   Final    BOTTLES DRAWN AEROBIC AND ANAEROBIC Blood Culture adequate volume   Culture   Final    NO GROWTH 2 DAYS Performed at [facility], [place] [address] [place], [place] [address]    Report Status PENDING  Incomplete     Terri Piedra, NP [place] [facility] [place]  10/08/2021  8:53 AM
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
## 2021-10-09 ENCOUNTER — Encounter (HOSPITAL_COMMUNITY): Payer: Self-pay | Admitting: Certified Registered Nurse Anesthetist

## 2021-10-09 ENCOUNTER — Encounter (HOSPITAL_COMMUNITY)
Admission: EM | Disposition: A | Discharge status: Home / Self Care | Payer: Self-pay | Source: Home / Self Care | Attending: Internal Medicine

## 2021-10-09 DIAGNOSIS — E43 Unspecified severe protein-calorie malnutrition: Secondary | ICD-10-CM | POA: Insufficient documentation

## 2021-10-09 LAB — CBC WITH DIFFERENTIAL/PLATELET
Abs Immature Granulocytes: 0.06 10*3/uL (ref 0.00–0.07)
Basophils Absolute: 0.1 10*3/uL (ref 0.0–0.1)
Basophils Relative: 1 %
Eosinophils Absolute: 0.1 10*3/uL (ref 0.0–0.5)
Eosinophils Relative: 1 %
HCT: 37.5 % (ref 36.0–46.0)
Hemoglobin: 12.6 g/dL (ref 12.0–15.0)
Immature Granulocytes: 1 %
Lymphocytes Relative: 29 %
Lymphs Abs: 1.9 10*3/uL (ref 0.7–4.0)
MCH: 32.9 pg (ref 26.0–34.0)
MCHC: 33.6 g/dL (ref 30.0–36.0)
MCV: 97.9 fL (ref 80.0–100.0)
Monocytes Absolute: 0.7 10*3/uL (ref 0.1–1.0)
Monocytes Relative: 10 %
Neutro Abs: 3.6 10*3/uL (ref 1.7–7.7)
Neutrophils Relative %: 58 %
Platelets: 699 10*3/uL — ABNORMAL HIGH (ref 150–400)
RBC: 3.83 MIL/uL — ABNORMAL LOW (ref 3.87–5.11)
RDW: 16.1 % — ABNORMAL HIGH (ref 11.5–15.5)
WBC: 6.4 10*3/uL (ref 4.0–10.5)
nRBC: 0 % (ref 0.0–0.2)

## 2021-10-09 LAB — BASIC METABOLIC PANEL
Anion gap: 9 (ref 5–15)
BUN: <5 mg/dL — ABNORMAL LOW (ref 6–20)
CO2: 23 mmol/L (ref 22–32)
Calcium: 8.8 mg/dL — ABNORMAL LOW (ref 8.9–10.3)
Chloride: 101 mmol/L (ref 98–111)
Creatinine, Ser: 0.5 mg/dL (ref 0.44–1.00)
GFR, Estimated: >60 mL/min (ref >60)
Glucose, Bld: 92 mg/dL (ref 70–99)
Potassium: 4.1 mmol/L (ref 3.5–5.1)
Sodium: 133 mmol/L — ABNORMAL LOW (ref 135–145)

## 2021-10-09 SURGERY — ECHOCARDIOGRAM, TRANSESOPHAGEAL
Anesthesia: Monitor Anesthesia Care

## 2021-10-09 MED ORDER — ENSURE ENLIVE PO LIQD
237.0000 mL | Freq: Three times a day (TID) | ORAL | Status: DC
  Administered 2021-10-09 – 2021-10-14 (×12): 237 mL via ORAL
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
## 2021-10-09 NOTE — Progress Notes (Signed)
Patient ID: Martha Campbell, female   DOB: 12/13/1976, 45 y.o.   MRN: [id number] Will sign off. If there are any questions please call the office [phone]

## 2021-10-10 DIAGNOSIS — E43 Unspecified severe protein-calorie malnutrition: Secondary | ICD-10-CM

## 2021-10-10 LAB — BASIC METABOLIC PANEL
Anion gap: 7 (ref 5–15)
BUN: 5 mg/dL — ABNORMAL LOW (ref 6–20)
CO2: 27 mmol/L (ref 22–32)
Calcium: 8.6 mg/dL — ABNORMAL LOW (ref 8.9–10.3)
Chloride: 98 mmol/L (ref 98–111)
Creatinine, Ser: 0.4 mg/dL — ABNORMAL LOW (ref 0.44–1.00)
GFR, Estimated: >60 mL/min (ref >60)
Glucose, Bld: 102 mg/dL — ABNORMAL HIGH (ref 70–99)
Potassium: 4.1 mmol/L (ref 3.5–5.1)
Sodium: 132 mmol/L — ABNORMAL LOW (ref 135–145)

## 2021-10-10 LAB — CBC WITH DIFFERENTIAL/PLATELET
Abs Immature Granulocytes: 0.06 10*3/uL (ref 0.00–0.07)
Basophils Absolute: 0.1 10*3/uL (ref 0.0–0.1)
Basophils Relative: 1 %
Eosinophils Absolute: 0.1 10*3/uL (ref 0.0–0.5)
Eosinophils Relative: 1 %
HCT: 35 % — ABNORMAL LOW (ref 36.0–46.0)
Hemoglobin: 12 g/dL (ref 12.0–15.0)
Immature Granulocytes: 1 %
Lymphocytes Relative: 27 %
Lymphs Abs: 1.9 10*3/uL (ref 0.7–4.0)
MCH: 33.5 pg (ref 26.0–34.0)
MCHC: 34.3 g/dL (ref 30.0–36.0)
MCV: 97.8 fL (ref 80.0–100.0)
Monocytes Absolute: 0.7 10*3/uL (ref 0.1–1.0)
Monocytes Relative: 10 %
Neutro Abs: 4.3 10*3/uL (ref 1.7–7.7)
Neutrophils Relative %: 60 %
Platelets: 741 10*3/uL — ABNORMAL HIGH (ref 150–400)
RBC: 3.58 MIL/uL — ABNORMAL LOW (ref 3.87–5.11)
RDW: 15.9 % — ABNORMAL HIGH (ref 11.5–15.5)
WBC: 7.1 10*3/uL (ref 4.0–10.5)
nRBC: 0 % (ref 0.0–0.2)

## 2021-10-10 MED ORDER — ENOXAPARIN SODIUM 40 MG/0.4ML IJ SOSY
40.0000 mg | PREFILLED_SYRINGE | INTRAMUSCULAR | Status: DC
  Administered 2021-10-10 – 2021-10-13 (×4): 40 mg via SUBCUTANEOUS
  Filled 2021-10-10 (×4): qty 0.4

## 2021-10-10 NOTE — Progress Notes (Addendum)
I have seen and examined the patient. I have personally reviewed the clinical findings, laboratory findings, microbiological data and imaging studies. The assessment and treatment plan was discussed with the Nurse Practitioner Mauricio Po. I agree with her/his recommendations except following additions/corrections.  Lying in bed with c collar. Mother at bedside.  No new complaints Exam with no concerns for septic peripheral joints Back pain is stable/improving   Afebrile, Blood cx 10/17 no growth in 3 days  No plans for TEE given C collar and concerns for c spine safety noted   Continue cefazolin for 6 weeks from date of negative blood cx from 12/75 given complicated MSSA bacteremia and cannot r/o endocarditis  Monitor CBC, BMP OK to place PICC for long term IV abtx  A follow up with [facility] will be made in 3-4 weeks  ID will sign off. Please call with questions.   Rosiland Oz, MD Infectious Disease Physician [facility] for Infectious Disease [address] [place], [place] [address] Phone: [phone]  Fax: [place] for Infectious Disease  Date of Admission:  09/29/2021     Total days of antibiotics 8         ASSESSMENT:  Ms. Eddleman blood cultures from 10/06/21 remain without growth to date. Unable to complete TEE to rule out endocarditis. Okay for PICC line placement and will plan for 6 weeks of Cefazolin from clearance of blood culture with end date of 11/17/21. Disposition pending with Home Health/OPAT orders placed. Arrange follow up in ID clinic in 3-4 weeks. Continue medical and supportive care per primary team. ID will sign off, please re-consult if needed.   PLAN:  Continue Cefazolin through 11/17/21. Okay for PICC line placement.  OPAT/Home Health orders.  Remaining medical and supportive care per primary team.  Follow up in ID clinic.  ID will sign off. Please re-consult if needed.    Diagnosis:  MSSA Bacteremia   Culture Result: MSSA   Allergies  Allergen Reactions   Penicillins Hives, Nausea And Vomiting and Swelling    fever    OPAT Orders Discharge antibiotics to be given via PICC line Discharge antibiotics: Cefazolin  Per pharmacy protocol   Duration:  6 weeks   End Date:  11/17/21  [facility] Care Per Protocol:  Home health RN for IV administration and teaching; PICC line care and labs.    Labs weekly while on IV antibiotics: _X_ CBC with differential _X_ BMP __ CMP __CRP __ESR __ Vancomycin trough __ CK  __ Please pull PIC at completion of IV antibiotics _X_ Please leave PIC in place until doctor has seen patient or been notified  Fax weekly labs to [phone]  Clinic Follow Up Appt:  10/29/21 at 2:30 pm with Dr. West Bali    Active Problems:   Spinal compression fracture [facility])   Pathologic thoracic fracture   Fever   Sepsis due to methicillin susceptible Staphylococcus aureus (MSSA) with acute liver failure without hepatic coma or septic shock (HCC)   Protein-calorie malnutrition, severe    Chlorhexidine Gluconate Cloth  6 each Topical Daily   feeding supplement  237 mL Oral TID BM   folic acid  1 mg Oral Daily   multivitamin with minerals  1 tablet Oral Daily   thiamine  100 mg Oral Daily    SUBJECTIVE:  Afebrile overnight with no acute events. No new concerns/complaints.   Allergies  Allergen Reactions   Penicillins Hives, Nausea And Vomiting and  Swelling    fever     Review of Systems: Review of Systems  Constitutional:  Negative for chills, fever and weight loss.  Respiratory:  Negative for cough, shortness of breath and wheezing.   Cardiovascular:  Negative for chest pain and leg swelling.  Gastrointestinal:  Negative for abdominal pain, constipation, diarrhea, nausea and vomiting.  Skin:  Negative for rash.     OBJECTIVE: Vitals:   10/10/21 0458 10/10/21 0511 10/10/21 0812 10/10/21 1122  BP:  109/77  105/82 105/75  Pulse: (!) 102  96 98  Resp: 19  18 15   Temp: 99.1 F (37.3 C)  98.8 F (37.1 C) 99.6 F (37.6 C)  TempSrc: Oral  Oral Oral  SpO2: 93%  94% 94%  Weight:  60.2 kg    Height:       Body mass index is 22.09 kg/m.  Physical Exam Constitutional:      General: She is not in acute distress.    Appearance: She is well-developed.     Interventions: Cervical collar in place.     Comments: Lying in bed with head of bed slightly elevated; pleasant. Wearing brace.   Cardiovascular:     Rate and Rhythm: Normal rate and regular rhythm.     Heart sounds: Normal heart sounds.  Pulmonary:     Effort: Pulmonary effort is normal.     Breath sounds: Normal breath sounds.  Skin:    General: Skin is warm and dry.  Neurological:     Mental Status: She is alert and oriented to person, place, and time.  Psychiatric:        Behavior: Behavior normal.        Thought Content: Thought content normal.        Judgment: Judgment normal.    Lab Results Lab Results  Component Value Date   WBC 7.1 10/10/2021   HGB 12.0 10/10/2021   HCT 35.0 (L) 10/10/2021   MCV 97.8 10/10/2021   PLT 741 (H) 10/10/2021    Lab Results  Component Value Date   CREATININE 0.40 (L) 10/10/2021   BUN 5 (L) 10/10/2021   NA 132 (L) 10/10/2021   K 4.1 10/10/2021   CL 98 10/10/2021   CO2 27 10/10/2021    Lab Results  Component Value Date   ALT 43 10/08/2021   AST 47 (H) 10/08/2021   ALKPHOS 241 (H) 10/08/2021   BILITOT 0.7 10/08/2021     Microbiology: Recent Results (from the past 240 hour(s))  Culture, blood (routine x 2)     Status: Abnormal   Collection Time: 10/02/21  6:44 PM   Specimen: BLOOD RIGHT ARM  Result Value Ref Range Status   Specimen Description BLOOD RIGHT ARM  Final   Special Requests   Final    BOTTLES DRAWN AEROBIC AND ANAEROBIC Blood Culture adequate volume   Culture  Setup Time   Final    GRAM POSITIVE COCCI IN BOTH AEROBIC AND ANAEROBIC BOTTLES CRITICAL RESULT  CALLED TO, READ BACK BY AND VERIFIED WITH: PHARMD ALEX L [id number] FCP Performed at [facility], [place] [address], [place], [place] [address]    Culture STAPHYLOCOCCUS AUREUS (A)  Final   Report Status 10/05/2021 FINAL  Final   Organism ID, Bacteria STAPHYLOCOCCUS AUREUS  Final      Susceptibility   Staphylococcus aureus - MIC*    CIPROFLOXACIN <=0.5 SENSITIVE Sensitive     ERYTHROMYCIN <=0.25 SENSITIVE Sensitive     GENTAMICIN <=0.5 SENSITIVE Sensitive  OXACILLIN 0.5 SENSITIVE Sensitive     TETRACYCLINE <=1 SENSITIVE Sensitive     VANCOMYCIN <=0.5 SENSITIVE Sensitive     TRIMETH/SULFA <=10 SENSITIVE Sensitive     CLINDAMYCIN <=0.25 SENSITIVE Sensitive     RIFAMPIN <=0.5 SENSITIVE Sensitive     Inducible Clindamycin NEGATIVE Sensitive     * STAPHYLOCOCCUS AUREUS  Culture, blood (routine x 2)     Status: Abnormal   Collection Time: 10/02/21  6:44 PM   Specimen: BLOOD RIGHT ARM  Result Value Ref Range Status   Specimen Description BLOOD RIGHT ARM  Final   Special Requests   Final    BOTTLES DRAWN AEROBIC AND ANAEROBIC Blood Culture adequate volume   Culture  Setup Time   Final    GRAM POSITIVE COCCI IN BOTH AEROBIC AND ANAEROBIC BOTTLES CRITICAL VALUE NOTED.  VALUE IS CONSISTENT WITH PREVIOUSLY REPORTED AND CALLED VALUE.    Culture (A)  Final    STAPHYLOCOCCUS AUREUS SUSCEPTIBILITIES PERFORMED ON PREVIOUS CULTURE WITHIN THE LAST 5 DAYS. Performed at [facility], [place] [address] [place], [place] [address]    Report Status 10/05/2021 FINAL  Final  Blood Culture ID Panel (Reflexed)     Status: Abnormal   Collection Time: 10/02/21  6:44 PM  Result Value Ref Range Status   Enterococcus faecalis NOT DETECTED NOT DETECTED Final   Enterococcus Faecium NOT DETECTED NOT DETECTED Final   Listeria monocytogenes NOT DETECTED NOT DETECTED Final   Staphylococcus species DETECTED (A) NOT DETECTED Final    Comment: CRITICAL RESULT CALLED TO, READ BACK BY AND VERIFIED  WITH: A,LAWLESS PHARMD @1020  10/03/21 FCP    Staphylococcus aureus (BCID) DETECTED (A) NOT DETECTED Final    Comment: CRITICAL RESULT CALLED TO, READ BACK BY AND VERIFIED WITH: A,LAWLESS PHARMD @1020  10/03/21 FCP    Staphylococcus epidermidis NOT DETECTED NOT DETECTED Final   Staphylococcus lugdunensis NOT DETECTED NOT DETECTED Final   Streptococcus species NOT DETECTED NOT DETECTED Final   Streptococcus agalactiae NOT DETECTED NOT DETECTED Final   Streptococcus pneumoniae NOT DETECTED NOT DETECTED Final   Streptococcus pyogenes NOT DETECTED NOT DETECTED Final   A.calcoaceticus-baumannii NOT DETECTED NOT DETECTED Final   Bacteroides fragilis NOT DETECTED NOT DETECTED Final   Enterobacterales NOT DETECTED NOT DETECTED Final   Enterobacter cloacae complex NOT DETECTED NOT DETECTED Final   Escherichia coli NOT DETECTED NOT DETECTED Final   Klebsiella aerogenes NOT DETECTED NOT DETECTED Final   Klebsiella oxytoca NOT DETECTED NOT DETECTED Final   Klebsiella pneumoniae NOT DETECTED NOT DETECTED Final   Proteus species NOT DETECTED NOT DETECTED Final   Salmonella species NOT DETECTED NOT DETECTED Final   Serratia marcescens NOT DETECTED NOT DETECTED Final   Haemophilus influenzae NOT DETECTED NOT DETECTED Final   Neisseria meningitidis NOT DETECTED NOT DETECTED Final   Pseudomonas aeruginosa NOT DETECTED NOT DETECTED Final   Stenotrophomonas maltophilia NOT DETECTED NOT DETECTED Final   Candida albicans NOT DETECTED NOT DETECTED Final   Candida auris NOT DETECTED NOT DETECTED Final   Candida glabrata NOT DETECTED NOT DETECTED Final   Candida krusei NOT DETECTED NOT DETECTED Final   Candida parapsilosis NOT DETECTED NOT DETECTED Final   Candida tropicalis NOT DETECTED NOT DETECTED Final   Cryptococcus neoformans/gattii NOT DETECTED NOT DETECTED Final   Meth resistant mecA/C and MREJ NOT DETECTED NOT DETECTED Final    Comment: Performed at [facility], [address],  [place], [place] [address]  MRSA Next Gen by PCR, Nasal  Status: None   Collection Time: 10/02/21  8:34 PM   Specimen: Nasal Mucosa; Nasal Swab  Result Value Ref Range Status   MRSA by PCR Next Gen NOT DETECTED NOT DETECTED Final    Comment: (NOTE) The GeneXpert MRSA Assay (FDA approved for NASAL specimens only), is one component of a comprehensive MRSA colonization surveillance program. It is not intended to diagnose MRSA infection nor to guide or monitor treatment for MRSA infections. Test performance is not FDA approved in patients less than 58 years old. Performed at [facility], [place] [address] [place], [place] [address]   Urine Culture     Status: None   Collection Time: 10/03/21 10:35 AM   Specimen: Urine, Clean Catch  Result Value Ref Range Status   Specimen Description URINE, CLEAN CATCH  Final   Special Requests NONE  Final   Culture   Final    NO GROWTH Performed at [facility], [place] [address], [place], [place] [address]    Report Status 10/04/2021 FINAL  Final  Culture, blood (routine x 2)     Status: Abnormal   Collection Time: 10/03/21 10:41 AM   Specimen: BLOOD  Result Value Ref Range Status   Specimen Description BLOOD LEFT ANTECUBITAL  Final   Special Requests   Final    BOTTLES DRAWN AEROBIC AND ANAEROBIC Blood Culture adequate volume   Culture  Setup Time   Final    GRAM POSITIVE COCCI IN CLUSTERS IN BOTH AEROBIC AND ANAEROBIC BOTTLES CRITICAL VALUE NOTED.  VALUE IS CONSISTENT WITH PREVIOUSLY REPORTED AND CALLED VALUE.    Culture (A)  Final    STAPHYLOCOCCUS AUREUS SUSCEPTIBILITIES PERFORMED ON PREVIOUS CULTURE WITHIN THE LAST 5 DAYS. Performed at [facility], [place] [address], [place], [place] [address]    Report Status 10/05/2021 FINAL  Final  Culture, blood (routine x 2)     Status: Abnormal   Collection Time: 10/03/21 10:52 AM   Specimen: BLOOD LEFT HAND  Result Value Ref Range Status   Specimen Description BLOOD LEFT HAND  Final    Special Requests   Final    BOTTLES DRAWN AEROBIC AND ANAEROBIC Blood Culture results may not be optimal due to an inadequate volume of blood received in culture bottles   Culture  Setup Time   Final    IN BOTH AEROBIC AND ANAEROBIC BOTTLES GRAM POSITIVE COCCI IN CLUSTERS CRITICAL VALUE NOTED.  VALUE IS CONSISTENT WITH PREVIOUSLY REPORTED AND CALLED VALUE.    Culture (A)  Final    STAPHYLOCOCCUS AUREUS SUSCEPTIBILITIES PERFORMED ON PREVIOUS CULTURE WITHIN THE LAST 5 DAYS. Performed at [facility], [place] [address], [place],  [address]    Report Status 10/05/2021 FINAL  Final  Culture, blood (routine x 2)     Status: Abnormal   Collection Time: 10/04/21  4:51 AM   Specimen: BLOOD  Result Value Ref Range Status   Specimen Description BLOOD LEFT ANTECUBITAL  Final   Special Requests   Final    AEROBIC BOTTLE ONLY Blood Culture results may not be optimal due to an excessive volume of blood received in culture bottles   Culture  Setup Time   Final    GRAM POSITIVE COCCI IN CLUSTERS AEROBIC BOTTLE ONLY CRITICAL VALUE NOTED.  VALUE IS CONSISTENT WITH PREVIOUSLY REPORTED AND CALLED VALUE.    Culture (A)  Final    STAPHYLOCOCCUS AUREUS SUSCEPTIBILITIES PERFORMED ON PREVIOUS CULTURE WITHIN THE LAST 5 DAYS. Performed at [facility], [place] [address] [place], [place] [address]    Report Status 10/06/2021 FINAL  Final  Culture, blood (routine x 2)     Status: Abnormal   Collection Time: 10/04/21  4:51 AM   Specimen: BLOOD LEFT FOREARM  Result Value Ref Range Status   Specimen Description BLOOD LEFT FOREARM  Final   Special Requests AEROBIC BOTTLE ONLY Blood Culture adequate volume  Final   Culture  Setup Time   Final    GRAM POSITIVE COCCI IN CLUSTERS AEROBIC BOTTLE ONLY CRITICAL VALUE NOTED.  VALUE IS CONSISTENT WITH PREVIOUSLY REPORTED AND CALLED VALUE.    Culture (A)  Final    STAPHYLOCOCCUS AUREUS SUSCEPTIBILITIES PERFORMED ON PREVIOUS CULTURE WITHIN THE LAST 5  DAYS. Performed at [facility], [place] [address] [place], [place] [address]    Report Status 10/06/2021 FINAL  Final  Culture, blood (routine x 2)     Status: None (Preliminary result)   Collection Time: 10/06/21  5:50 AM   Specimen: BLOOD  Result Value Ref Range Status   Specimen Description BLOOD LEFT ARM  Final   Special Requests   Final    BOTTLES DRAWN AEROBIC AND ANAEROBIC Blood Culture adequate volume   Culture   Final    NO GROWTH 3 DAYS Performed at [facility], [place] [address] [place], [place] [address]    Report Status PENDING  Incomplete  Culture, blood (routine x 2)     Status: None (Preliminary result)   Collection Time: 10/06/21  5:57 AM   Specimen: BLOOD LEFT HAND  Result Value Ref Range Status   Specimen Description BLOOD LEFT HAND  Final   Special Requests   Final    BOTTLES DRAWN AEROBIC AND ANAEROBIC Blood Culture adequate volume   Culture   Final    NO GROWTH 3 DAYS Performed at [facility], [place] [address], [place], [place] [address]    Report Status PENDING  Incomplete     Terri Piedra, NP [place] for Infectious Disease [facility]  10/10/2021  11:47 AM
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
## 2021-10-10 NOTE — Progress Notes (Signed)
PHARMACY CONSULT NOTE FOR:  OUTPATIENT  PARENTERAL ANTIBIOTIC THERAPY (OPAT)  Indication: MSSA Bacteremia Regimen: Cefazolin 2g IV q8h End date: 11/17/21  IV antibiotic discharge orders are pended. To discharging provider:  please sign these orders via discharge navigator,  Select New Orders & click on the button choice - Manage This Unsigned Work.     Thank you for allowing pharmacy to be a part of this patient's care.  Lestine Box, PharmD PGY2 Infectious Diseases Pharmacy Resident   Please check [email or web address] for unit-specific pharmacy phone numbers

## 2021-10-11 ENCOUNTER — Other Ambulatory Visit (HOSPITAL_COMMUNITY): Payer: Self-pay

## 2021-10-11 DIAGNOSIS — L8990 Pressure ulcer of unspecified site, unspecified stage: Secondary | ICD-10-CM | POA: Insufficient documentation

## 2021-10-11 LAB — BASIC METABOLIC PANEL
Anion gap: 8 (ref 5–15)
BUN: <5 mg/dL — ABNORMAL LOW (ref 6–20)
CO2: 26 mmol/L (ref 22–32)
Calcium: 9.1 mg/dL (ref 8.9–10.3)
Chloride: 99 mmol/L (ref 98–111)
Creatinine, Ser: 0.44 mg/dL (ref 0.44–1.00)
GFR, Estimated: >60 mL/min (ref >60)
Glucose, Bld: 95 mg/dL (ref 70–99)
Potassium: 4.7 mmol/L (ref 3.5–5.1)
Sodium: 133 mmol/L — ABNORMAL LOW (ref 135–145)

## 2021-10-11 LAB — GLUCOSE, CAPILLARY: Glucose-Capillary: 93 mg/dL (ref 70–99)

## 2021-10-11 LAB — CULTURE, BLOOD (ROUTINE X 2)
Culture: NO GROWTH
Culture: NO GROWTH
Special Requests: ADEQUATE
Special Requests: ADEQUATE

## 2021-10-11 LAB — CBC WITH DIFFERENTIAL/PLATELET
Abs Immature Granulocytes: 0.11 10*3/uL — ABNORMAL HIGH (ref 0.00–0.07)
Basophils Absolute: 0.1 10*3/uL (ref 0.0–0.1)
Basophils Relative: 1 %
Eosinophils Absolute: 0.1 10*3/uL (ref 0.0–0.5)
Eosinophils Relative: 1 %
HCT: 35.9 % — ABNORMAL LOW (ref 36.0–46.0)
Hemoglobin: 12.2 g/dL (ref 12.0–15.0)
Immature Granulocytes: 1 %
Lymphocytes Relative: 28 %
Lymphs Abs: 2.3 10*3/uL (ref 0.7–4.0)
MCH: 33.5 pg (ref 26.0–34.0)
MCHC: 34 g/dL (ref 30.0–36.0)
MCV: 98.6 fL (ref 80.0–100.0)
Monocytes Absolute: 0.7 10*3/uL (ref 0.1–1.0)
Monocytes Relative: 8 %
Neutro Abs: 5.1 10*3/uL (ref 1.7–7.7)
Neutrophils Relative %: 61 %
Platelets: 767 10*3/uL — ABNORMAL HIGH (ref 150–400)
RBC: 3.64 MIL/uL — ABNORMAL LOW (ref 3.87–5.11)
RDW: 15.9 % — ABNORMAL HIGH (ref 11.5–15.5)
WBC: 8.3 10*3/uL (ref 4.0–10.5)
nRBC: 0 % (ref 0.0–0.2)
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
## 2021-10-12 ENCOUNTER — Inpatient Hospital Stay: Payer: Self-pay

## 2021-10-12 LAB — CBC WITH DIFFERENTIAL/PLATELET
Abs Immature Granulocytes: 0.07 10*3/uL (ref 0.00–0.07)
Basophils Absolute: 0.1 10*3/uL (ref 0.0–0.1)
Basophils Relative: 1 %
Eosinophils Absolute: 0.1 10*3/uL (ref 0.0–0.5)
Eosinophils Relative: 1 %
HCT: 35.5 % — ABNORMAL LOW (ref 36.0–46.0)
Hemoglobin: 12 g/dL (ref 12.0–15.0)
Immature Granulocytes: 1 %
Lymphocytes Relative: 24 %
Lymphs Abs: 2.1 10*3/uL (ref 0.7–4.0)
MCH: 33.1 pg (ref 26.0–34.0)
MCHC: 33.8 g/dL (ref 30.0–36.0)
MCV: 98.1 fL (ref 80.0–100.0)
Monocytes Absolute: 0.7 10*3/uL (ref 0.1–1.0)
Monocytes Relative: 8 %
Neutro Abs: 5.9 10*3/uL (ref 1.7–7.7)
Neutrophils Relative %: 65 %
Platelets: 747 10*3/uL — ABNORMAL HIGH (ref 150–400)
RBC: 3.62 MIL/uL — ABNORMAL LOW (ref 3.87–5.11)
RDW: 15.6 % — ABNORMAL HIGH (ref 11.5–15.5)
WBC: 9 10*3/uL (ref 4.0–10.5)
nRBC: 0 % (ref 0.0–0.2)

## 2021-10-12 LAB — BASIC METABOLIC PANEL
Anion gap: 9 (ref 5–15)
BUN: 6 mg/dL (ref 6–20)
CO2: 25 mmol/L (ref 22–32)
Calcium: 8.9 mg/dL (ref 8.9–10.3)
Chloride: 96 mmol/L — ABNORMAL LOW (ref 98–111)
Creatinine, Ser: 0.4 mg/dL — ABNORMAL LOW (ref 0.44–1.00)
GFR, Estimated: >60 mL/min (ref >60)
Glucose, Bld: 95 mg/dL (ref 70–99)
Potassium: 4.5 mmol/L (ref 3.5–5.1)
Sodium: 130 mmol/L — ABNORMAL LOW (ref 135–145)

## 2021-10-12 LAB — SAVE SMEAR(SSMR), FOR PROVIDER SLIDE REVIEW
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
## 2021-10-12 NOTE — TOC Progression Note (Signed)
Transition of Care [facility]) - Progression Note    Patient Details  Name: ELANE PEABODY MRN: [id number] Date of Birth: 1976-08-30  Transition of Care [facility]) CM/SW [place], [place] Phone Number: 10/12/2021, 10:03 AM  Clinical Narrative:     Patient with no bed offers due to no payor sources and other SNF barriers. TOC appreciates therapy efforts to work with patient while in the hospital.   Per notes from CIR/[place] on 10/17- copied- see below I spoke with PT regarding Pt.'s potential for CIR. Pt. Is progressing slowly and is difficulty to motivate for therapy, with both PT and OT recommending SNF. I am in agreement that Pt. Likely cannot tolerate the intensity of CIR at this point. CIR will sign off for now. If Pt. Progresses and demonstrates improved participation/tolerance, MD may place another order for CIR consult. Please contact me with any questions.   PT is now recommending CIR- patient needs CIR consult    -TOC will continue to follow to and assist with discharge planning.   Thurmond Butts, MSW, LCSW Clinical Social Worker    Expected Discharge Plan: Skilled Nursing Facility Barriers to Discharge: Inadequate or no insurance, No SNF bed  Expected Discharge Plan and Services Expected Discharge Plan: [place] In-house Referral: Clinical Social Work   Post Acute Care Choice: [place]                                         Social Determinants of Health (SDOH) Interventions    Readmission Risk Interventions No flowsheet data found.

## 2021-10-12 NOTE — Progress Notes (Signed)
Secure chat with Dr Johnney Ou re PICC order.  Has adequate PIV access at this point.  Disposition re SNF vs CIR still undetermined.  New order to cancel PICC until firm placement has been determined.

## 2021-10-13 LAB — HEPATIC FUNCTION PANEL
ALT: 21 U/L (ref 0–44)
AST: 41 U/L (ref 15–41)
Albumin: 2.4 g/dL — ABNORMAL LOW (ref 3.5–5.0)
Alkaline Phosphatase: 199 U/L — ABNORMAL HIGH (ref 38–126)
Bilirubin, Direct: 0.1 mg/dL (ref 0.0–0.2)
Indirect Bilirubin: 0.4 mg/dL (ref 0.3–0.9)
Total Bilirubin: 0.5 mg/dL (ref 0.3–1.2)
Total Protein: 6.7 g/dL (ref 6.5–8.1)

## 2021-10-13 LAB — BASIC METABOLIC PANEL
Anion gap: 8 (ref 5–15)
BUN: <5 mg/dL — ABNORMAL LOW (ref 6–20)
CO2: 27 mmol/L (ref 22–32)
Calcium: 9.1 mg/dL (ref 8.9–10.3)
Chloride: 95 mmol/L — ABNORMAL LOW (ref 98–111)
Creatinine, Ser: 0.45 mg/dL (ref 0.44–1.00)
GFR, Estimated: >60 mL/min (ref >60)
Glucose, Bld: 93 mg/dL (ref 70–99)
Potassium: 4.3 mmol/L (ref 3.5–5.1)
Sodium: 130 mmol/L — ABNORMAL LOW (ref 135–145)

## 2021-10-13 MED ORDER — ACETAMINOPHEN 500 MG PO TABS
1000.0000 mg | ORAL_TABLET | Freq: Two times a day (BID) | ORAL | Status: DC
  Administered 2021-10-14: 1000 mg via ORAL
  Filled 2021-10-13: qty 2

## 2021-10-13 NOTE — Progress Notes (Signed)
Patient refusing to take scheduled tylenol due to the fact that she was told not to take it because of her liver damage.  Patient would like to speak to Dr. about this in the morning.

## 2021-10-13 NOTE — Progress Notes (Signed)
Inpatient Rehab Admissions Coordinator:   Met with pt at bedside to discuss CIR recommendations.  She feels her pain is better controlled and she's better able to participate in therapy at this time.  We reviewed 3 hrs/day of therapy and she feels ready.  I confirmed that her plan is to stay with her mom at discharge and I also reviewed cost of CIR with her, as well as how that works with Medicaid potential.  She is agreeable to cost and I will follow for potential admit pending bed availability.   Shann Medal, PT, DPT Admissions Coordinator [phone] 10/13/21  12:49 PM
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
## 2021-10-13 NOTE — Progress Notes (Signed)
Inpatient Rehab Admissions Coordinator:   Consult received and chart reviewed.  Note difficulty with SNF placement.  Pt demonstrated good participation with PT/OT on 10/19, but this has not been consistent.  Will see how she does with PT today, but she really needs to be participating in out of bed therapy each session to demonstrate tolerance and potential to make gains on CIR.    Shann Medal, PT, DPT Admissions Coordinator [phone] 10/13/21  10:08 AM

## 2021-10-14 ENCOUNTER — Encounter (HOSPITAL_COMMUNITY): Payer: Self-pay | Admitting: Physical Medicine & Rehabilitation

## 2021-10-14 ENCOUNTER — Encounter (HOSPITAL_COMMUNITY): Payer: Self-pay | Admitting: Internal Medicine

## 2021-10-14 ENCOUNTER — Encounter (HOSPITAL_COMMUNITY): Payer: Self-pay | Admitting: Emergency Medicine

## 2021-10-14 ENCOUNTER — Inpatient Hospital Stay: Payer: Self-pay

## 2021-10-14 ENCOUNTER — Other Ambulatory Visit: Payer: Self-pay

## 2021-10-14 ENCOUNTER — Inpatient Hospital Stay (HOSPITAL_COMMUNITY)
Admission: RE | Admit: 2021-10-14 | Discharge: 2021-10-24 | DRG: 560 | Disposition: A | Discharge status: Home / Self Care | Payer: Self-pay | Source: Intra-hospital | Attending: Physical Medicine & Rehabilitation | Admitting: Physical Medicine & Rehabilitation

## 2021-10-14 DIAGNOSIS — M2580 Other specified joint disorders, unspecified joint: Secondary | ICD-10-CM

## 2021-10-14 DIAGNOSIS — M21372 Foot drop, left foot: Secondary | ICD-10-CM | POA: Diagnosis present

## 2021-10-14 DIAGNOSIS — Z23 Encounter for immunization: Secondary | ICD-10-CM

## 2021-10-14 DIAGNOSIS — Z880 Allergy status to penicillin: Secondary | ICD-10-CM

## 2021-10-14 DIAGNOSIS — F17210 Nicotine dependence, cigarettes, uncomplicated: Secondary | ICD-10-CM | POA: Diagnosis present

## 2021-10-14 DIAGNOSIS — S12690D Other displaced fracture of seventh cervical vertebra, subsequent encounter for fracture with routine healing: Secondary | ICD-10-CM | POA: Diagnosis present

## 2021-10-14 DIAGNOSIS — E8809 Other disorders of plasma-protein metabolism, not elsewhere classified: Secondary | ICD-10-CM | POA: Diagnosis present

## 2021-10-14 DIAGNOSIS — D696 Thrombocytopenia, unspecified: Secondary | ICD-10-CM | POA: Diagnosis present

## 2021-10-14 DIAGNOSIS — T1490XA Injury, unspecified, initial encounter: Secondary | ICD-10-CM | POA: Diagnosis present

## 2021-10-14 DIAGNOSIS — E871 Hypo-osmolality and hyponatremia: Secondary | ICD-10-CM | POA: Diagnosis not present

## 2021-10-14 DIAGNOSIS — M4850XA Collapsed vertebra, not elsewhere classified, site unspecified, initial encounter for fracture: Secondary | ICD-10-CM | POA: Diagnosis present

## 2021-10-14 DIAGNOSIS — F1010 Alcohol abuse, uncomplicated: Secondary | ICD-10-CM | POA: Diagnosis present

## 2021-10-14 DIAGNOSIS — M810 Age-related osteoporosis without current pathological fracture: Secondary | ICD-10-CM | POA: Diagnosis present

## 2021-10-14 DIAGNOSIS — I38 Endocarditis, valve unspecified: Secondary | ICD-10-CM | POA: Diagnosis present

## 2021-10-14 DIAGNOSIS — K760 Fatty (change of) liver, not elsewhere classified: Secondary | ICD-10-CM | POA: Diagnosis present

## 2021-10-14 DIAGNOSIS — K5900 Constipation, unspecified: Secondary | ICD-10-CM | POA: Diagnosis not present

## 2021-10-14 DIAGNOSIS — L89321 Pressure ulcer of left buttock, stage 1: Secondary | ICD-10-CM | POA: Diagnosis present

## 2021-10-14 DIAGNOSIS — E559 Vitamin D deficiency, unspecified: Secondary | ICD-10-CM | POA: Diagnosis present

## 2021-10-14 DIAGNOSIS — Z681 Body mass index (BMI) 19 or less, adult: Secondary | ICD-10-CM | POA: Diagnosis not present

## 2021-10-14 DIAGNOSIS — D75838 Other thrombocytosis: Secondary | ICD-10-CM | POA: Diagnosis not present

## 2021-10-14 DIAGNOSIS — R7881 Bacteremia: Secondary | ICD-10-CM | POA: Diagnosis present

## 2021-10-14 DIAGNOSIS — D75839 Thrombocytosis, unspecified: Secondary | ICD-10-CM | POA: Diagnosis present

## 2021-10-14 DIAGNOSIS — R6520 Severe sepsis without septic shock: Secondary | ICD-10-CM

## 2021-10-14 DIAGNOSIS — D62 Acute posthemorrhagic anemia: Secondary | ICD-10-CM

## 2021-10-14 DIAGNOSIS — B9561 Methicillin susceptible Staphylococcus aureus infection as the cause of diseases classified elsewhere: Secondary | ICD-10-CM | POA: Diagnosis present

## 2021-10-14 DIAGNOSIS — E876 Hypokalemia: Secondary | ICD-10-CM

## 2021-10-14 DIAGNOSIS — L89311 Pressure ulcer of right buttock, stage 1: Secondary | ICD-10-CM | POA: Diagnosis present

## 2021-10-14 DIAGNOSIS — E440 Moderate protein-calorie malnutrition: Secondary | ICD-10-CM | POA: Diagnosis present

## 2021-10-14 DIAGNOSIS — D709 Neutropenia, unspecified: Secondary | ICD-10-CM | POA: Diagnosis present

## 2021-10-14 DIAGNOSIS — R350 Frequency of micturition: Secondary | ICD-10-CM

## 2021-10-14 DIAGNOSIS — Z85828 Personal history of other malignant neoplasm of skin: Secondary | ICD-10-CM

## 2021-10-14 DIAGNOSIS — A4101 Sepsis due to Methicillin susceptible Staphylococcus aureus: Secondary | ICD-10-CM

## 2021-10-14 DIAGNOSIS — T07XXXA Unspecified multiple injuries, initial encounter: Secondary | ICD-10-CM

## 2021-10-14 LAB — BASIC METABOLIC PANEL
Anion gap: 10 (ref 5–15)
BUN: 5 mg/dL — ABNORMAL LOW (ref 6–20)
CO2: 28 mmol/L (ref 22–32)
Calcium: 9.2 mg/dL (ref 8.9–10.3)
Chloride: 93 mmol/L — ABNORMAL LOW (ref 98–111)
Creatinine, Ser: 0.45 mg/dL (ref 0.44–1.00)
GFR, Estimated: >60 mL/min (ref >60)
Glucose, Bld: 95 mg/dL (ref 70–99)
Potassium: 4.3 mmol/L (ref 3.5–5.1)
Sodium: 131 mmol/L — ABNORMAL LOW (ref 135–145)

## 2021-10-14 LAB — CBC WITH DIFFERENTIAL/PLATELET
Abs Immature Granulocytes: 0.05 10*3/uL (ref 0.00–0.07)
Basophils Absolute: 0.1 10*3/uL (ref 0.0–0.1)
Basophils Relative: 1 %
Eosinophils Absolute: 0.1 10*3/uL (ref 0.0–0.5)
Eosinophils Relative: 2 %
HCT: 34.6 % — ABNORMAL LOW (ref 36.0–46.0)
Hemoglobin: 11.8 g/dL — ABNORMAL LOW (ref 12.0–15.0)
Immature Granulocytes: 1 %
Lymphocytes Relative: 24 %
Lymphs Abs: 1.9 10*3/uL (ref 0.7–4.0)
MCH: 33.1 pg (ref 26.0–34.0)
MCHC: 34.1 g/dL (ref 30.0–36.0)
MCV: 97.2 fL (ref 80.0–100.0)
Monocytes Absolute: 0.7 10*3/uL (ref 0.1–1.0)
Monocytes Relative: 8 %
Neutro Abs: 5.1 10*3/uL (ref 1.7–7.7)
Neutrophils Relative %: 64 %
Platelets: 634 10*3/uL — ABNORMAL HIGH (ref 150–400)
RBC: 3.56 MIL/uL — ABNORMAL LOW (ref 3.87–5.11)
RDW: 15.5 % (ref 11.5–15.5)
WBC: 7.9 10*3/uL (ref 4.0–10.5)
nRBC: 0 % (ref 0.0–0.2)

## 2021-10-14 LAB — PATHOLOGIST SMEAR REVIEW

## 2021-10-14 MED ORDER — OXYCODONE HCL 5 MG PO TABS
10.0000 mg | ORAL_TABLET | ORAL | Status: DC | PRN
  Administered 2021-10-14 – 2021-10-22 (×32): 10 mg via ORAL
  Filled 2021-10-14 (×32): qty 2

## 2021-10-14 MED ORDER — OXYCODONE HCL 5 MG PO TABS
5.0000 mg | ORAL_TABLET | ORAL | Status: DC | PRN
  Administered 2021-10-14 – 2021-10-17 (×4): 5 mg via ORAL
  Filled 2021-10-14 (×5): qty 1

## 2021-10-14 MED ORDER — PROCHLORPERAZINE 25 MG RE SUPP: 12.5000 mg | Freq: Four times a day (QID) | RECTAL | Status: DC | PRN

## 2021-10-14 MED ORDER — THIAMINE HCL 100 MG PO TABS: 100.0000 mg | ORAL_TABLET | Freq: Every day | ORAL | 0 refills | Status: DC

## 2021-10-14 MED ORDER — CEFAZOLIN SODIUM-DEXTROSE 2-4 GM/100ML-% IV SOLN
2.0000 g | Freq: Three times a day (TID) | INTRAVENOUS | Status: DC
  Administered 2021-10-14 – 2021-10-24 (×30): 2 g via INTRAVENOUS
  Filled 2021-10-14 (×36): qty 100

## 2021-10-14 MED ORDER — FOLIC ACID 1 MG PO TABS: 1.0000 mg | ORAL_TABLET | Freq: Every day | ORAL | 0 refills | Status: DC

## 2021-10-14 MED ORDER — CHLORHEXIDINE GLUCONATE CLOTH 2 % EX PADS
6.0000 | MEDICATED_PAD | Freq: Every day | CUTANEOUS | Status: DC
Start: 2021-10-15 — End: 2021-10-24
  Administered 2021-10-15 – 2021-10-24 (×8): 6 via TOPICAL

## 2021-10-14 MED ORDER — SODIUM CHLORIDE 0.9% FLUSH
10.0000 mL | Freq: Two times a day (BID) | INTRAVENOUS | Status: DC
  Administered 2021-10-14 – 2021-10-24 (×17): 10 mL

## 2021-10-14 MED ORDER — INFLUENZA VAC SPLIT QUAD 0.5 ML IM SUSY
0.5000 mL | PREFILLED_SYRINGE | INTRAMUSCULAR | Status: AC
  Administered 2021-10-15: 0.5 mL via INTRAMUSCULAR
  Filled 2021-10-14: qty 0.5

## 2021-10-14 MED ORDER — FOLIC ACID 1 MG PO TABS
1.0000 mg | ORAL_TABLET | Freq: Every day | ORAL | Status: DC
  Administered 2021-10-15 – 2021-10-24 (×10): 1 mg via ORAL
  Filled 2021-10-14 (×10): qty 1

## 2021-10-14 MED ORDER — OXYCODONE HCL 5 MG PO TABS: 5.0000 mg | ORAL_TABLET | ORAL | 0 refills | Status: DC | PRN

## 2021-10-14 MED ORDER — MAGIC MOUTHWASH W/LIDOCAINE: 1.0000 mL | Freq: Every day | ORAL | Status: DC | PRN

## 2021-10-14 MED ORDER — ALBUTEROL SULFATE (2.5 MG/3ML) 0.083% IN NEBU: 2.5000 mg | INHALATION_SOLUTION | RESPIRATORY_TRACT | Status: DC | PRN

## 2021-10-14 MED ORDER — CEFAZOLIN IV (FOR PTA / DISCHARGE USE ONLY): 2.0000 g | Freq: Three times a day (TID) | INTRAVENOUS | 0 refills | Status: DC

## 2021-10-14 MED ORDER — THIAMINE HCL 100 MG PO TABS
100.0000 mg | ORAL_TABLET | Freq: Every day | ORAL | Status: DC
Start: 2021-10-15 — End: 2021-10-24
  Administered 2021-10-15 – 2021-10-24 (×10): 100 mg via ORAL
  Filled 2021-10-14 (×10): qty 1

## 2021-10-14 MED ORDER — SODIUM CHLORIDE 0.9% FLUSH: 10.0000 mL | INTRAVENOUS | Status: DC | PRN

## 2021-10-14 MED ORDER — ENOXAPARIN SODIUM 40 MG/0.4ML IJ SOSY
40.0000 mg | PREFILLED_SYRINGE | INTRAMUSCULAR | Status: DC
  Administered 2021-10-14 – 2021-10-23 (×10): 40 mg via SUBCUTANEOUS
  Filled 2021-10-14 (×10): qty 0.4

## 2021-10-14 MED ORDER — DIPHENHYDRAMINE HCL 12.5 MG/5ML PO ELIX: 12.5000 mg | ORAL_SOLUTION | Freq: Four times a day (QID) | ORAL | Status: DC | PRN

## 2021-10-14 MED ORDER — ALUM & MAG HYDROXIDE-SIMETH 200-200-20 MG/5ML PO SUSP: 30.0000 mL | ORAL | Status: DC | PRN

## 2021-10-14 MED ORDER — PROCHLORPERAZINE EDISYLATE 10 MG/2ML IJ SOLN: 5.0000 mg | Freq: Four times a day (QID) | INTRAMUSCULAR | Status: DC | PRN

## 2021-10-14 MED ORDER — BISACODYL 10 MG RE SUPP: 10.0000 mg | Freq: Every day | RECTAL | Status: DC | PRN

## 2021-10-14 MED ORDER — TRAZODONE HCL 50 MG PO TABS
25.0000 mg | ORAL_TABLET | Freq: Every evening | ORAL | Status: DC | PRN
  Administered 2021-10-14 – 2021-10-22 (×5): 50 mg via ORAL
  Filled 2021-10-14 (×7): qty 1

## 2021-10-14 MED ORDER — ACETAMINOPHEN 325 MG PO TABS
650.0000 mg | ORAL_TABLET | Freq: Two times a day (BID) | ORAL | Status: DC
  Administered 2021-10-14 – 2021-10-24 (×20): 650 mg via ORAL
  Filled 2021-10-14 (×20): qty 2

## 2021-10-14 MED ORDER — SODIUM CHLORIDE 0.9% FLUSH
10.0000 mL | INTRAVENOUS | Status: DC | PRN
Start: 2021-10-14 — End: 2021-10-24
  Administered 2021-10-20: 10 mL

## 2021-10-14 MED ORDER — GUAIFENESIN-DM 100-10 MG/5ML PO SYRP: 5.0000 mL | ORAL_SOLUTION | Freq: Four times a day (QID) | ORAL | Status: DC | PRN

## 2021-10-14 MED ORDER — POLYETHYLENE GLYCOL 3350 17 G PO PACK
17.0000 g | PACK | Freq: Every day | ORAL | Status: DC | PRN
  Administered 2021-10-22: 17 g via ORAL
  Filled 2021-10-14 (×2): qty 1

## 2021-10-14 MED ORDER — SODIUM CHLORIDE 0.9% FLUSH
10.0000 mL | Freq: Two times a day (BID) | INTRAVENOUS | Status: DC
  Administered 2021-10-14: 10 mL

## 2021-10-14 MED ORDER — ENSURE ENLIVE PO LIQD
237.0000 mL | Freq: Three times a day (TID) | ORAL | Status: DC
  Administered 2021-10-15 – 2021-10-24 (×19): 237 mL via ORAL

## 2021-10-14 MED ORDER — MAGIC MOUTHWASH W/LIDOCAINE: 1.0000 mL | Freq: Every day | ORAL | 0 refills | Status: DC | PRN

## 2021-10-14 MED ORDER — FLEET ENEMA 7-19 GM/118ML RE ENEM: 1.0000 | ENEMA | Freq: Once | RECTAL | Status: DC | PRN

## 2021-10-14 MED ORDER — ADULT MULTIVITAMIN W/MINERALS CH
1.0000 | ORAL_TABLET | Freq: Every day | ORAL | Status: DC
  Administered 2021-10-15 – 2021-10-24 (×10): 1 via ORAL
  Filled 2021-10-14 (×10): qty 1

## 2021-10-14 MED ORDER — PROCHLORPERAZINE MALEATE 5 MG PO TABS: 5.0000 mg | ORAL_TABLET | Freq: Four times a day (QID) | ORAL | Status: DC | PRN

## 2021-10-14 MED ORDER — ACETAMINOPHEN 325 MG PO TABS
325.0000 mg | ORAL_TABLET | ORAL | Status: DC | PRN
  Administered 2021-10-22 (×2): 650 mg via ORAL
  Filled 2021-10-14 (×4): qty 2

## 2021-10-14 MED ORDER — ACETAMINOPHEN 500 MG PO TABS: 1000.0000 mg | ORAL_TABLET | Freq: Two times a day (BID) | ORAL | 0 refills | Status: DC

## 2021-10-14 MED ORDER — ENOXAPARIN SODIUM 40 MG/0.4ML IJ SOSY: 40.0000 mg | PREFILLED_SYRINGE | INTRAMUSCULAR | Status: DC

## 2021-10-14 NOTE — Progress Notes (Signed)
Inpatient Rehab Admissions Coordinator:   I have a bed available for this patient to admit to CIR today. Dr. Charleen Kirks in agreement.  I will let pt/family and TOC team know.   Shann Medal, PT, DPT Admissions Coordinator [phone] 10/14/21  11:00 AM
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
## 2021-10-14 NOTE — Progress Notes (Addendum)
Inpatient Rehabilitation Admission Medication Review by a Pharmacist  A complete drug regimen review was completed for this patient to identify any potential clinically significant medication issues.  High Risk Drug Classes Is patient taking? Indication by Medication  Antipsychotic Yes Prochlorperazine PRN nausea   Anticoagulant Yes Enoxaparin for DVT prophylaxis  Antibiotic Yes is intravenous  Cefazolin for osteomyelitis  Opioid Yes Oxycocone PRN for acute pain  Antiplatelet No   Hypoglycemics/insulin No   Vasoactive Medication No   Chemotherapy No   Other No      Type of Medication Issue Identified Description of Issue Recommendation(s)  Drug Interaction(s) (clinically significant)     Duplicate Therapy  Duplicate enoxaparin prophylaxis orders Discontinued one  Allergy     No Medication Administration End Date  Cefazolin for MSSA Bacteremia  Add end date of 11/17/21   Incorrect Dose     Additional Drug Therapy Needed     Significant med changes from prior encounter (inform family/care partners about these prior to discharge). Did not take any medications PTA, all will be new to patient  Educate at discharge   Other       Clinically significant medication issues were identified that warrant physician communication and completion of prescribed/recommended actions by midnight of the next day:  No  Name of provider notified for urgent issues identified: N/a  Provider Method of Notification:     Time spent performing this drug regimen review (minutes):  [place], PharmD, BCPS, [facility] Clinical Pharmacist  Please check AMION for all [facility] phone numbers After 10:00 PM, call Britt

## 2021-10-14 NOTE — Progress Notes (Signed)
PT Cancellation Note  Patient Details Name: Martha Campbell MRN: [id number] DOB: Aug 03, 1976   Cancelled Treatment:    Reason Eval/Treat Not Completed: Other (comment).  Headed to CIR, discharged from PT.   Ramond Dial 10/14/2021, 3:48 PM  Mee Hives, PT PhD Acute Rehab Dept. Number: [place]

## 2021-10-15 DIAGNOSIS — D75839 Thrombocytosis, unspecified: Secondary | ICD-10-CM

## 2021-10-15 DIAGNOSIS — R350 Frequency of micturition: Secondary | ICD-10-CM

## 2021-10-15 DIAGNOSIS — E871 Hypo-osmolality and hyponatremia: Secondary | ICD-10-CM

## 2021-10-15 DIAGNOSIS — M21372 Foot drop, left foot: Secondary | ICD-10-CM

## 2021-10-15 DIAGNOSIS — T1490XA Injury, unspecified, initial encounter: Secondary | ICD-10-CM

## 2021-10-15 DIAGNOSIS — R7881 Bacteremia: Secondary | ICD-10-CM

## 2021-10-15 DIAGNOSIS — D62 Acute posthemorrhagic anemia: Secondary | ICD-10-CM

## 2021-10-15 LAB — COMPREHENSIVE METABOLIC PANEL
ALT: 18 U/L (ref 0–44)
AST: 33 U/L (ref 15–41)
Albumin: 2.3 g/dL — ABNORMAL LOW (ref 3.5–5.0)
Alkaline Phosphatase: 176 U/L — ABNORMAL HIGH (ref 38–126)
Anion gap: 6 (ref 5–15)
BUN: <5 mg/dL — ABNORMAL LOW (ref 6–20)
CO2: 28 mmol/L (ref 22–32)
Calcium: 8.8 mg/dL — ABNORMAL LOW (ref 8.9–10.3)
Chloride: 97 mmol/L — ABNORMAL LOW (ref 98–111)
Creatinine, Ser: 0.42 mg/dL — ABNORMAL LOW (ref 0.44–1.00)
GFR, Estimated: >60 mL/min (ref >60)
Glucose, Bld: 95 mg/dL (ref 70–99)
Potassium: 3.6 mmol/L (ref 3.5–5.1)
Sodium: 131 mmol/L — ABNORMAL LOW (ref 135–145)
Total Bilirubin: 0.7 mg/dL (ref 0.3–1.2)
Total Protein: 6.2 g/dL — ABNORMAL LOW (ref 6.5–8.1)

## 2021-10-15 LAB — URINALYSIS, ROUTINE W REFLEX MICROSCOPIC
Bilirubin Urine: NEGATIVE
Glucose, UA: NEGATIVE mg/dL
Hgb urine dipstick: NEGATIVE
Ketones, ur: NEGATIVE mg/dL
Leukocytes,Ua: NEGATIVE
Nitrite: NEGATIVE
Protein, ur: NEGATIVE mg/dL
Specific Gravity, Urine: 1.005 (ref 1.005–1.030)
pH: 8 (ref 5.0–8.0)

## 2021-10-15 LAB — CBC WITH DIFFERENTIAL/PLATELET
Abs Immature Granulocytes: 0.03 10*3/uL (ref 0.00–0.07)
Basophils Absolute: 0.1 10*3/uL (ref 0.0–0.1)
Basophils Relative: 1 %
Eosinophils Absolute: 0.1 10*3/uL (ref 0.0–0.5)
Eosinophils Relative: 2 %
HCT: 33.2 % — ABNORMAL LOW (ref 36.0–46.0)
Hemoglobin: 11.1 g/dL — ABNORMAL LOW (ref 12.0–15.0)
Immature Granulocytes: 1 %
Lymphocytes Relative: 29 %
Lymphs Abs: 1.9 10*3/uL (ref 0.7–4.0)
MCH: 32.8 pg (ref 26.0–34.0)
MCHC: 33.4 g/dL (ref 30.0–36.0)
MCV: 98.2 fL (ref 80.0–100.0)
Monocytes Absolute: 0.6 10*3/uL (ref 0.1–1.0)
Monocytes Relative: 8 %
Neutro Abs: 3.9 10*3/uL (ref 1.7–7.7)
Neutrophils Relative %: 59 %
Platelets: 544 10*3/uL — ABNORMAL HIGH (ref 150–400)
RBC: 3.38 MIL/uL — ABNORMAL LOW (ref 3.87–5.11)
RDW: 15.3 % (ref 11.5–15.5)
WBC: 6.5 10*3/uL (ref 4.0–10.5)
nRBC: 0 % (ref 0.0–0.2)

## 2021-10-15 LAB — GLUCOSE, CAPILLARY: Glucose-Capillary: 84 mg/dL (ref 70–99)

## 2021-10-15 MED ORDER — CITALOPRAM HYDROBROMIDE 10 MG PO TABS
10.0000 mg | ORAL_TABLET | Freq: Every day | ORAL | Status: DC
  Administered 2021-10-15: 10 mg via ORAL
  Filled 2021-10-15 (×2): qty 1
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
## 2021-10-15 NOTE — Progress Notes (Signed)
Patient information reviewed and entered into e[facility]hab System by Becky Astrid Vides, PPS coordinator. Information including medical coding, function ability, and quality indicators will be reviewed and updated through discharge.   

## 2021-10-15 NOTE — Progress Notes (Signed)
Orthopedic Tech Progress Note Patient Details:  SVARA TWYMAN 1976-07-08 [id number]  Called in order to [employer] for a [facility] PRAFO BOOT  Patient ID: KAYTELYN GLORE, female   DOB: [birth year], 45 y.o.   MRN: [id number]  Janit Pagan 10/15/2021, 1:49 PM

## 2021-10-15 NOTE — Progress Notes (Addendum)
[facility] PHYSICAL MEDICINE & REHABILITATION PROGRESS NOTE  Subjective/Complaints: Patient seen laying in bed this morning, attempting to work with therapies.  She states she slept well overnight.  She states she is in too much pain when she moves and is awaiting pain medications.  Discussed urinary frequency with nursing.  Discussed pain and bracing with therapies.  ROS: + Back pain. Denies CP, SOB, N/V/D  Objective: Vital Signs: Blood pressure 97/67, pulse 92, temperature 98.3 F (36.8 C), temperature source Oral, resp. rate 16, height _0  (1.651 m), weight 53.6 kg, last menstrual period 08/04/2013, SpO2 96 %. [place] EKG SITE RITE  Result Date: 10/14/2021 If Site Rite image not attached, placement could not be confirmed due to current cardiac rhythm.  Recent Labs    10/14/21 0244 10/15/21 0403  WBC 7.9 6.5  HGB 11.8* 11.1*  HCT 34.6* 33.2*  PLT 634* 544*   Recent Labs    10/14/21 0244 10/15/21 0403  NA 131* 131*  K 4.3 3.6  CL 93* 97*  CO2 28 28  GLUCOSE 95 95  BUN 5* <5*  CREATININE 0.45 0.42*  CALCIUM 9.2 8.8*    Intake/Output Summary (Last 24 hours) at 10/15/2021 1324 Last data filed at 10/15/2021 1100 Gross per 24 hour  Intake 640 ml  Output 0 ml  Net 640 ml     Pressure Injury 10/11/21 Buttocks Right;Left Deep Tissue Pressure Injury - Purple or maroon localized area of discolored intact skin or blood-filled blister due to damage of underlying soft tissue from pressure and/or shear. about 1x1 bilateral bottocks  (Active)  10/11/21 0735  Location: Buttocks  Location Orientation: Right;Left  Staging: Deep Tissue Pressure Injury - Purple or maroon localized area of discolored intact skin or blood-filled blister due to damage of underlying soft tissue from pressure and/or shear.  Wound Description (Comments): about 1x1 bilateral bottocks purple color  Present on Admission:      Pressure Injury 10/11/21 Buttocks Right;Left Stage 1 -  Intact skin with  non-blanchable redness of a localized area usually over a bony prominence. redness 6x7 bilateral bottocks (Active)  10/11/21 0735  Location: Buttocks  Location Orientation: Right;Left  Staging: Stage 1 -  Intact skin with non-blanchable redness of a localized area usually over a bony prominence.  Wound Description (Comments): redness 6x7 bilateral bottocks  Present on Admission:     Physical Exam: BP 97/67 (BP Location: Left Arm)   Pulse 92   Temp 98.3 F (36.8 C) (Oral)   Resp 16   Ht _1  (1.651 m)   Wt 53.6 kg   LMP 08/04/2013   SpO2 96%   BMI 19.66 kg/m  Constitutional: + Distressed. Vital signs reviewed. HENT: Normocephalic.  Atraumatic. Neck: + C-collar Eyes: EOMI. No discharge. Cardiovascular: No JVD.  + Tachycardia. Respiratory: Normal effort.  No stridor.  Bilateral clear to auscultation. GI: Non-distended.  BS +. Skin: Warm and dry.  Intact. Psych: Tearful.  Slowed. Musc: No edema in extremities.  No tenderness in extremities. + TLSO. Neuro: Alert Motor: B/l UE: 5/5 proximal to distal RLE:4--4/5 proximal to distal LLE:HF, KE 4-/5, ADF 1/5   Assessment/Plan: 1. Functional deficits which require 3+ hours per day of interdisciplinary therapy in a comprehensive inpatient rehab setting. Physiatrist is providing close team supervision and 24 hour management of active medical problems listed below. Physiatrist and rehab team continue to assess barriers to discharge/monitor patient progress toward functional and medical goals   Care Tool:  Bathing    Body parts  bathed by patient: Right arm, Left arm, Chest, Abdomen, Right upper leg, Left upper leg, Right lower leg, Left lower leg, Face     Body parts n/a: Front perineal area, Buttocks (pt reported nursing performed peri-care prior to OT)   Bathing assist Assist Level: Minimal Assistance - Patient > 75%     Upper Body Dressing/Undressing Upper body dressing   What is the patient wearing?: Pull over shirt,  Orthosis (TLSO & C-Collar) Orthosis activity level: Performed by helper  Upper body assist Assist Level: Minimal Assistance - Patient > 75%    Lower Body Dressing/Undressing Lower body dressing      What is the patient wearing?: Pants, Incontinence brief, Orthosis     Lower body assist Assist for lower body dressing: Maximal Assistance - Patient 25 - 49%     Toileting Toileting    Toileting assist Assist for toileting: Maximal Assistance - Patient 25 - 49%     Transfers Chair/bed transfer  Transfers assist     Chair/bed transfer assist level: Maximal Assistance - Patient 25 - 49%     Locomotion Ambulation   Ambulation assist              Walk 10 feet activity   Assist           Walk 50 feet activity   Assist           Walk 150 feet activity   Assist           Walk 10 feet on uneven surface  activity   Assist           Wheelchair     Assist               Wheelchair 50 feet with 2 turns activity    Assist            Wheelchair 150 feet activity     Assist           Medical Problem List and Plan: 1.  Deficits with mobility, transfers, self-care secondary to polytrauma.  Begin CIR evaluations 2.  Antithrombotics: -DVT/anticoagulation:  Pharmaceutical: Lovenox added 10/22             -antiplatelet therapy:  3. Pain Management: PRN meds  Will be cautious with medications due to history of drug abuse  Patient will require significant encouragement and reassurance 4. Mood: Celexa started on 10/26             -antipsychotic agents: N/A as delirium tremens has resolved.  5. Neuropsych: This patient is capable of making decisions on her own behalf. 6. Skin/Wound Care: Routine pressure relief measures.  7. Fluids/Electrolytes/Nutrition: Monitor I/Os 8. MSSA bacteremia on IV cefazolin for 6 weeks--end date 11/17/21 as endocarditis not ruled out.  9. Fatty liver: Abnormal LFTs resolved except for  elevated A phos.    Alk phos improving on 10/26 10. Acute on chronic hyponatremia: Continue to monitor Na with serial checks.              Sodium 131 on 10/26 11. Thrombocytosis: Likely reactive.  Platelets 544 on 10/26 12. Hypoalbuminemia: Albumin level- 2.4.  13. Lytic lesions right tibia: Will need outpatient work up. 14.  C7 fracture: Aspen collar on at all times.  15. T1, T2  and T11 deformities/fractures/hematoma: TLSO when OOB 16. ABLA: Monitor H/H with serial checks. Hgb 11-12 range. WBC stable.              Hemoglobin 11.1 on  7/26 17.  Urinary frequency  UA/culture ordered 18.  Left foot drop  PRAFO ordered  Will likely require AFO  LOS: 1 days A FACE TO FACE EVALUATION WAS PERFORMED  Claud Gowan Lorie Phenix 10/15/2021, 1:24 PM

## 2021-10-15 NOTE — Progress Notes (Signed)
Occupational Therapy Session Note  Patient Details  Name: Martha Campbell MRN: [id number] Date of Birth: [birth year]  Today's Date: 10/15/2021 OT Individual Time: 1435-1500 OT Individual Time Calculation (min): 25 min    Short Term Goals: Week 1:  OT Short Term Goal 1 (Week 1): Pt will complete LB dressing with MIN A & LRA OT Short Term Goal 2 (Week 1): Pt will perform grooming standing with LRAD & min A OT Short Term Goal 3 (Week 1): Pt will transfer with LRAD to [facility] with CGA  Skilled Therapeutic Interventions/Progress Updates:  Pt supine reporting high levels of pain from previous session, politely declining any bed mobility or OOB mobility. Pt with STM deficits, mixing up staff members and information explained earlier. Pt verbose and requiring cueing to sustain attention to task at hand. Pt completed the MOCA to assess cognition- she scored 25/30. Normal score is 26/30. 25 is indicative of a mild cognitive impairment. Pt reports she had prior strategies for memory deficits, then also stated her memory was perfect. Will continue to follow up on cognition and its' impact on functional activities. Pt was left supine with all needs met, bed alarm set.   Therapy Documentation Precautions:  Precautions Precautions: Cervical, Back Precaution Booklet Issued: Yes (comment) (from acute) Required Braces or Orthoses: Cervical Brace, Spinal Brace Cervical Brace: Hard collar, At all times Spinal Brace: Thoracolumbosacral orthotic, Applied in sitting position Restrictions Weight Bearing Restrictions: Yes   Therapy/Group: Individual Therapy  Curtis Sites 10/15/2021, 3:20 PM

## 2021-10-16 DIAGNOSIS — E559 Vitamin D deficiency, unspecified: Secondary | ICD-10-CM

## 2021-10-16 LAB — URINE CULTURE: Culture: NO GROWTH

## 2021-10-16 MED ORDER — METHOCARBAMOL 500 MG PO TABS
500.0000 mg | ORAL_TABLET | Freq: Three times a day (TID) | ORAL | Status: DC | PRN
  Administered 2021-10-16 – 2021-10-17 (×4): 500 mg via ORAL
  Filled 2021-10-16 (×4): qty 1

## 2021-10-16 MED ORDER — VITAMIN D 25 MCG (1000 UNIT) PO TABS
1000.0000 [IU] | ORAL_TABLET | Freq: Every day | ORAL | Status: DC
  Administered 2021-10-16 – 2021-10-24 (×9): 1000 [IU] via ORAL
  Filled 2021-10-16 (×9): qty 1

## 2021-10-16 NOTE — Progress Notes (Signed)
Physical Therapy Session Note  Patient Details  Name: Martha Campbell MRN: [id number] Date of Birth: 03-20-1976  Today's Date: 10/16/2021 PT Individual Time: 1025-1045 PT Individual Time Calculation (min): 20 min   Short Term Goals: Week 1:  PT Short Term Goal 1 (Week 1): Patient will perform bed mobility with min A in a flat bed without use of bed rails with improved pain tolerance by >2 points on 10 point pain scale (8/10 with rolling on eval). PT Short Term Goal 2 (Week 1): Patient will perform basic transfers with CGA consistently from various household surfaces using LRAD. PT Short Term Goal 3 (Week 1): Patient with ambulate >100 feet with CGA using LRAD. PT Short Term Goal 4 (Week 1): Patient will tolerate sitting OOB >30 min between therapy sessions 2/5 days this week.  Skilled Therapeutic Interventions/Progress Updates:     Patient in bed ordering her meals with nutritional services upon PT arrival. Patient requested to finish ordering her meals prior to PT. PT returned after 10 min with patient finishing her orders. Patient reported 8/10 back pain with muscle spasms and reported that the spasms increased with mobility. Patient stated, "I just don't think I can do therapy." Spent >10 min discussing pain type and frequency with patient then discussing management with RN and Martha Hoff PA. Orders being put in for Robaxin with plan to alternate oxy and Robaxin for improved pain management. Educated patient on medications, frequency of administration, calling for medications, as they are PRN, and wrote next dose time for alternating doses, 11pm oxy and 1pm Robaxin for improved patient agency with pain management.   Patient very appreciative for education on pain management and PT advocacy with medical and nursing team for patient. Patient declined further intervention at this time due to pain. Patient missed 25 min of skilled PT due to ordering meals and pain, RN made aware. Will attempt to make-up  missed time as able.    Patient in bed at end of session with breaks locked, bed alarm set, and all needs within reach.   Therapy Documentation Precautions:  Precautions Precautions: Cervical, Back Precaution Booklet Issued: Yes (comment) (from acute) Required Braces or Orthoses: Cervical Brace, Spinal Brace Cervical Brace: Hard collar, At all times Spinal Brace: Thoracolumbosacral orthotic, Applied in sitting position Restrictions Weight Bearing Restrictions: Yes   Therapy/Group: Individual Therapy  Amiir Heckard L Keoshia Steinmetz PT, DPT  10/16/2021, 11:04 AM

## 2021-10-16 NOTE — Progress Notes (Signed)
Patient ID: Martha Campbell, female   DOB: July 07, 1976, 45 y.o.   MRN: [id number]  SW spoke with [facility] to discuss medication costs. Will begin to review pt chart and follow-up about costs. SW requested LOG with Tourist information centre manager for HHSN/PT/OT and waiting on follow-up.   Loralee Pacas, MSW, [place] Office: [phone] Cell: [phone] Fax: [phone]
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
## 2021-10-16 NOTE — Progress Notes (Signed)
Spoke with Dr. Posey Pronto regarding Vitamin D that was noted to be low while she was inpatient. It was 28 at that time. It was not repleted, so this was information was given to her current care team. Appreciate Dr. Posey Pronto prompt response.    [name], MD [facility]. [facility] Internal Medicine Residency, PGY-1

## 2021-10-16 NOTE — Care Management (Signed)
[place] Individual Statement of Services  Patient Name:  Martha Campbell  Date:  10/16/2021  Welcome to the [facility].  Our goal is to provide you with an individualized program based on your diagnosis and situation, designed to meet your specific needs.  With this comprehensive rehabilitation program, you will be expected to participate in at least 3 hours of rehabilitation therapies Monday-Friday, with modified therapy programming on the weekends.  Your rehabilitation program will include the following services:  Physical Therapy (PT), Occupational Therapy (OT), 24 hour per day rehabilitation nursing, Therapeutic Recreaction (TR), Psychology, Neuropsychology, Care Coordinator, Rehabilitation Medicine, [place], and Other  Weekly team conferences will be held on Tuesdays to discuss your progress.  Your Inpatient Rehabilitation Care Coordinator will talk with you frequently to get your input and to update you on team discussions.  Team conferences with you and your family in attendance may also be held.  Expected length of stay: 10-12 days    Overall anticipated outcome: Independent with an Assistive Device  Depending on your progress and recovery, your program may change. Your Inpatient Rehabilitation Care Coordinator will coordinate services and will keep you informed of any changes. Your Inpatient Rehabilitation Care Coordinator's name and contact numbers are listed  below.  The following services may also be recommended but are not provided by the [facility] will be made to provide these services after discharge if needed.  Arrangements include referral to agencies that provide these services.  Your insurance has been verified to be:  Uninsured  Your primary  doctor is:  [employer]  Pertinent information will be shared with your doctor and your insurance company.  Inpatient Rehabilitation Care Coordinator:  Cathleen Corti [phone] or ([phone]  Information discussed with and copy given to patient by: Rana Snare, 10/16/2021, 9:39 AM

## 2021-10-16 NOTE — Progress Notes (Signed)
Occupational Therapy Session Note  Patient Details  Name: Martha Campbell MRN: [id number] Date of Birth: 30-Nov-1976  Today's Date: 10/16/2021 OT Individual Time: 0800-0900 OT Individual Time Calculation (min): 60 min  Today's Date: 10/16/2021 OT Individual Time: 6295-2841 OT Individual Time Calculation (min): 29 min  and Today's Date: 10/16/2021 OT Missed Time: 76 Minutes Missed Time Reason: Pain;Other (comment) (pt politely refused tx d/t increased pain)    Short Term Goals: Week 1:  OT Short Term Goal 1 (Week 1): Pt will complete LB dressing with MIN A & LRA OT Short Term Goal 2 (Week 1): Pt will perform grooming standing with LRAD & min A OT Short Term Goal 3 (Week 1): Pt will transfer with LRAD to [facility] with CGA  Skilled Therapeutic Interventions/Progress Updates:   Pt received in bed  with 4/10 out of 10 pain in low back. Rest and repositioning provided for pain relief. C/o pain after EOB activity 7/10 L lower back area.   ADL: Pt completes BADL at overall MIN-MOD level EOB Skilled interventions include: Pt with poor recall of back precautions during functional tasks. Pt verbose & getting staff members confused with information given/medications. Pt required cuing to sustain attention to task at hand. Pt particular about order of events and apprehensive to pain during tx. Pt completes UB dressing EOB with overall min A and additional min cuing to avoid twisting or bending to reach feet. Donned hospital gown, denying wearing scrubs d/t room temperature. Pt with perseverative tendencies about therapists, PICC line, number of pillows and details of previous therapist sessions. TLSO donned EOB with max A. Requests to use commode, completes sit <> stand with overall min A & RW. Stand <> pivot to [facility] with void of urine. Mod A to pull pants over hips with grimaces of pain during OOB activity. Pt with increased pain and requests rest break before performing next activity. Brushes teeth at bed level  with setup. Pt left at end of session in bed with exit alarm on, 4 bed rails up per pt request, call light in reach and all needs met   Session II: Pt received in bed, politely declining OOB activities d/t pain and muscle spasms. Pt reported she started muscle relaxer today and hopes to resume therapies tomorrow. Pt received skilled education on breathing and relaxation techniques for pain mgmt. Introduced extended exhale technique. Pt verbalized understanding and is open to further education. Pt requests pads on C-collar to be changed. OT/OTS changed & washed pads, applying new ones with pt lying in supine. Spent extensive time discussing d/c planning, family education, and DME needs at d/c. Pt left in bed with needs met, bed alarm on and call bell nearby.   Missed 46 mins of therapy.  Therapy Documentation Precautions:  Precautions Precautions: Cervical, Back Precaution Booklet Issued: Yes (comment) (from acute) Required Braces or Orthoses: Cervical Brace, Spinal Brace Cervical Brace: Hard collar, At all times Spinal Brace: Thoracolumbosacral orthotic, Applied in sitting position Restrictions Weight Bearing Restrictions: Yes   Therapy/Group: Individual Therapy  Joaquim Tolen 10/16/2021, 8:50 AM

## 2021-10-16 NOTE — Progress Notes (Signed)
Inpatient Rehabilitation Care Coordinator Assessment and Plan Patient Details  Name: Martha Campbell MRN: [id number] Date of Birth: 07/27/1976  Today's Date: 10/16/2021  Hospital Problems: Principal Problem:   Trauma Active Problems:   Urinary frequency   Left foot drop   Vitamin D deficiency  Past Medical History:  Past Medical History:  Diagnosis Date   Anxiety    ETOH abuse    Fatty liver    Skin cancer    Withdrawal seizures, with delirium ([place]) 2019   Two different ED visits--incidental finding of SDH on 10/21   Past Surgical History:  Past Surgical History:  Procedure Laterality Date   BASAL CELL CARCINOMA EXCISION  03/2018   Cyst removed from [place] Right 06/16/2018   Procedure: surgical prep scalp wound 16cm2, full thickness skin graft from right arm to scalp;  Surgeon: Irene Limbo, MD;  Location: [facility];  Service: Plastics;  Laterality: Right;   SKIN FULL THICKNESS GRAFT  05/2018   To scalp   Social History:  reports that she has been smoking cigarettes. She has a 42.00 pack-year smoking history. She has never used smokeless tobacco. She reports current alcohol use. She reports that she does not use drugs.  Family / Support Systems Marital Status: Divorced How Long?: 15 years Patient Roles: Parent Spouse/Significant Other: N/A Children: no Children Other Supports: Mother Anticipated Caregiver: Mother Ability/Limitations of Caregiver: Pt mother can provider intermittent support since she works 1hr in the morning and 2hrs in the afternoon for children she babysits. Caregiver Availability: Intermittent Family Dynamics: Pt lives alone.  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 Education: some college- associates in [employer] - How often do you need to have someone help you when you  read instructions, pamphlets, or other written material from your doctor or pharmacy?: Never Writes: Yes Employment Status: Unemployed Date Retired/Disabled/Unemployed: 09/2021 due to accident; able to return to [employer] when able to work again Public relations account executive Issues: Denies Guardian/Conservator: N/A   Abuse/Neglect Abuse/Neglect Assessment Can Be Completed: Yes Physical Abuse: Denies Verbal Abuse: Denies Sexual Abuse: Denies Exploitation of patient/patient's resources: Denies Self-Neglect: Denies  Patient response to: Social Isolation - How often do you feel lonely or isolated from those around you?: Never  Emotional Status Pt's affect, behavior and adjustment status: Pt in good spirits at time of visit Recent Psychosocial Issues: Denies Psychiatric History: Denies Substance Abuse History: Pt admits to smoking cigarettes 1ppd for the last 10 years; admits to drinking scotch atleast 2xs per week. denies rec drug use.  Patient / Family Perceptions, Expectations & Goals Pt/Family understanding of illness & functional limitations: Pt and family have general understanding of care needs Premorbid pt/family roles/activities: independent Anticipated changes in roles/activities/participation: some assitstance with ADLs/IADLs Pt/family expectations/goals: Pt goal is to "begin walking again and get back cleare dup because I am ready to return to work."  [employer]: None Premorbid Home Care/DME Agencies: None Transportation available at discharge: Mother Is the patient able to respond to transportation needs?: Yes In the past 12 months, has lack of transportation kept you from medical appointments or from getting medications?: No In the past 12 months, has lack of transportation kept you from meetings, work, or from getting things needed for daily living?: No Resource referrals recommended: Neuropsychology  Discharge Planning Living  Arrangements: Alone Support Systems: Parent  Type of Residence: Private residence Insurance Resources: Teacher, adult education Resources: Family Support Financial Screen Referred: Yes Living Expenses: Lives with family Money Management: Patient Does the patient have any problems obtaining your medications?: No Home Management: Pt managed all home care needs Patient/Family Preliminary Plans: TBD Care Coordinator Barriers to Discharge: Decreased caregiver support, Lack of/limited family support Care Coordinator Anticipated Follow Up Needs: HH/OP Expected length of stay: 10-12 days  Clinical Impression SW met with pt in room to introduce self, explain role, and discuss discharge process. Pt is not a English as a second language teacher. NO HCPOA formal paperwork but would like for it to be her mother. No DME. SW discussed with patient barriers towards discharge and working on potential options for [facility] and reduced medication costs with [facility]. Pt aware SW will follow-up with her mother.   *[phone]- SW spoke with pt mother Zigmund Daniel [phone]) to introduce self, explain above,and discuss discharge process. SW informed will provide pt with Medicaid application she can complete as pt was not eligible with screened by financial counselors. SW discussed above with regard to medications and charity [facility] if possible. SW will f/u after team conference on Tuesday.   Rana Snare 10/16/2021, 3:44 PM

## 2021-10-17 DIAGNOSIS — T07XXXA Unspecified multiple injuries, initial encounter: Secondary | ICD-10-CM

## 2021-10-17 LAB — CBC WITH DIFFERENTIAL/PLATELET
Abs Immature Granulocytes: 0.02 10*3/uL (ref 0.00–0.07)
Basophils Absolute: 0.1 10*3/uL (ref 0.0–0.1)
Basophils Relative: 2 %
Eosinophils Absolute: 0.2 10*3/uL (ref 0.0–0.5)
Eosinophils Relative: 3 %
HCT: 33.3 % — ABNORMAL LOW (ref 36.0–46.0)
Hemoglobin: 11 g/dL — ABNORMAL LOW (ref 12.0–15.0)
Immature Granulocytes: 0 %
Lymphocytes Relative: 37 %
Lymphs Abs: 2 10*3/uL (ref 0.7–4.0)
MCH: 32.6 pg (ref 26.0–34.0)
MCHC: 33 g/dL (ref 30.0–36.0)
MCV: 98.8 fL (ref 80.0–100.0)
Monocytes Absolute: 0.5 10*3/uL (ref 0.1–1.0)
Monocytes Relative: 9 %
Neutro Abs: 2.6 10*3/uL (ref 1.7–7.7)
Neutrophils Relative %: 49 %
Platelets: 458 10*3/uL — ABNORMAL HIGH (ref 150–400)
RBC: 3.37 MIL/uL — ABNORMAL LOW (ref 3.87–5.11)
RDW: 15.3 % (ref 11.5–15.5)
WBC: 5.3 10*3/uL (ref 4.0–10.5)
nRBC: 0 % (ref 0.0–0.2)

## 2021-10-17 LAB — GLUCOSE, CAPILLARY: Glucose-Capillary: 89 mg/dL (ref 70–99)

## 2021-10-17 NOTE — Progress Notes (Signed)
Physical Therapy Session Note  Patient Details  Name: Martha Campbell MRN: [id number] Date of Birth: Dec 19, 1976  Today's Date: 10/17/2021 PT Individual Time: 1035-1100 PT Individual Time Calculation (min): 25 min   Short Term Goals: Week 1:  PT Short Term Goal 1 (Week 1): Patient will perform bed mobility with min A in a flat bed without use of bed rails with improved pain tolerance by >2 points on 10 point pain scale (8/10 with rolling on eval). PT Short Term Goal 2 (Week 1): Patient will perform basic transfers with CGA consistently from various household surfaces using LRAD. PT Short Term Goal 3 (Week 1): Patient with ambulate >100 feet with CGA using LRAD. PT Short Term Goal 4 (Week 1): Patient will tolerate sitting OOB >30 min between therapy sessions 2/5 days this week.   Skilled Therapeutic Interventions/Progress Updates:   Pt received sitting in WC and agreeable to PT. Pt performed stand pivot transfer to [facility] with CGA and UE support on WC. Supine>sit with supervision assist once TLSO was removed.   PT instructed pt in SUpine therex  SLR x 8 with AAROM Heel slide x 10  Hip abduction  x 12  Ankle PF/DF x 20 with AAROM on the LLE SAQ x 12.  Cues for increased ROM throughout as well as increased hold at end range as tolerated.   Pt left supine in bed with call bell in reach and all needs met.      Therapy Documentation Precautions:  Precautions Precautions: Cervical, Back Precaution Booklet Issued: Yes (comment) (from acute) Required Braces or Orthoses: Cervical Brace, Spinal Brace Cervical Brace: Hard collar, At all times Spinal Brace: Thoracolumbosacral orthotic, Applied in sitting position Restrictions Weight Bearing Restrictions: No    Vital Signs: Therapy Vitals Temp: 97.8 F (36.6 C) Pulse Rate: 77 Resp: 16 BP: 93/68 Patient Position (if appropriate): Lying Oxygen Therapy SpO2: 94 % O2 Device: Room Air Pain: Pain Assessment Pain Score: 0-No  pain    Therapy/Group: Individual Therapy  Lorie Phenix 10/17/2021, 3:32 PM

## 2021-10-17 NOTE — Progress Notes (Signed)
Orthopedic Tech Progress Note Patient Details:  Martha Campbell [birth year] [id number] Patient has brace Patient ID: Martha Campbell, female   DOB: 1976-11-16, 45 y.o.   MRN: [id number]  [name] 10/17/2021, 8:22 PM

## 2021-10-17 NOTE — Progress Notes (Signed)
Occupational Therapy Session Note  Patient Details  Name: Martha Campbell MRN: [id number] Date of Birth: October 22, 1976  Today's Date: 10/17/2021 OT Individual Time: 1335-1405 OT Individual Time Calculation (min): 30 min  and Today's Date: 10/17/2021 OT Missed Time: 30 Minutes Missed Time Reason: Pain   Short Term Goals: Week 1:  OT Short Term Goal 1 (Week 1): Pt will complete LB dressing with MIN A & LRA OT Short Term Goal 2 (Week 1): Pt will perform grooming standing with LRAD & min A OT Short Term Goal 3 (Week 1): Pt will transfer with LRAD to [facility] with CGA  Skilled Therapeutic Interventions/Progress Updates:    Pt received in bed with 4 out of 10 pain in low back. Frequent rest provided for pain relief. Participation continues to be limited d/t pain, lack of cognitive flexibility and anxiety.  ADL: Pt completes BADL at overall min level. Skilled interventions include: Pt requests to use toilet, and needs heavy encouragement to use BSC instead of bed pan. Pt comes from supine to EOB using logroll technique with frequent grimaces of pain and needing rests. TLSO donned with total A for time mgmt. Pt completes EOB <> BSC with min a and continent void of urine. Pt demanding of placement of brief on bed and slightly agitated. Pt with perseveration on prior therapy sessions, stating she pushed herself "too hard" and worked with unfamiliar therapists which she was concerned about. Pt returned to EOB, and OTS facilitated working on functional transfers. Pt immediately with increased anxiety, back spasms and pain, requesting to lie back down and rest. Pt refused further OOB activities and declined getting in the wc to look at the bathroom setup for showering in future. OTS explained bathroom setup with grab bars and TTB - pt states it is not like her bathroom at home since her showerhead is on the R, not the L. Pt overly concerned with the TTB and swinging legs to R (not left). Pt declined any modalities for  pain relief, requested bed to be lowered and pillows placed under arms. Pt left at end of session in bed with exit alarm on, all bedrails up per pt, call light in reach and all needs met  30 minutes of Skilled OT missed d/t pain and pt refusal.   Therapy Documentation Precautions:  Precautions Precautions: Cervical, Back Precaution Booklet Issued: Yes (comment) (from acute) Required Braces or Orthoses: Cervical Brace, Spinal Brace Cervical Brace: Hard collar, At all times Spinal Brace: Thoracolumbosacral orthotic, Applied in sitting position Restrictions Weight Bearing Restrictions: No General: General OT Amount of Missed Time: 30 Minutes   Therapy/Group: Individual Therapy  Magon Croson 10/17/2021, 2:48 PM

## 2021-10-17 NOTE — Progress Notes (Signed)
Occupational Therapy Session Note  Patient Details  Name: Martha Campbell MRN: [id number] Date of Birth: June 08, 1976  Today's Date: 10/17/2021 OT Individual Time: 3837-7939 OT Individual Time Calculation (min): 28 min    Short Term Goals: Week 1:  OT Short Term Goal 1 (Week 1): Pt will complete LB dressing with MIN A & LRA OT Short Term Goal 2 (Week 1): Pt will perform grooming standing with LRAD & min A OT Short Term Goal 3 (Week 1): Pt will transfer with LRAD to [facility] with CGA  Skilled Therapeutic Interventions/Progress Updates:    Pt resting in w/c upon arrival. See pain below. Pt "worn out" from earlier PT sessin. OT intervention with review of OT goals and discharge planning. Pt to temporarily with mother who has BSC and other DME. Pt will need a TTB if her mother does not have one. Educated on back and cervical precautions. Pt remained in w/c with all needs within reach.   Therapy Documentation Precautions:  Precautions Precautions: Cervical, Back Precaution Booklet Issued: Yes (comment) (from acute) Required Braces or Orthoses: Cervical Brace, Spinal Brace Cervical Brace: Hard collar, At all times Spinal Brace: Thoracolumbosacral orthotic, Applied in sitting position Restrictions Weight Bearing Restrictions: No   Pain: Pt denies pain but states she is "worn out and sore" from earlier therapy;emotional support   Therapy/Group: Individual Therapy  Leroy Libman 10/17/2021, 9:59 AM

## 2021-10-17 NOTE — Progress Notes (Signed)
[facility] PHYSICAL MEDICINE & REHABILITATION PROGRESS NOTE  Subjective/Complaints:  Seen in PT, feels better some left sided buttock pain, meds adjusted yesterday , muscle relaxer added, pain control improved   ROS: + Back pain, improved.  Denies CP, SOB, N/V/D  Objective: Vital Signs: Blood pressure 93/67, pulse 65, temperature 98.3 F (36.8 C), temperature source Oral, resp. rate 18, height $RemoveBe'5\' 5"'qNpjMqQhp$  (1.651 m), weight 55.1 kg, last menstrual period 08/04/2013, SpO2 95 %. No results found. Recent Labs    10/15/21 0403 10/17/21 0445  WBC 6.5 5.3  HGB 11.1* 11.0*  HCT 33.2* 33.3*  PLT 544* 458*    Recent Labs    10/15/21 0403  NA 131*  K 3.6  CL 97*  CO2 28  GLUCOSE 95  BUN <5*  CREATININE 0.42*  CALCIUM 8.8*     Intake/Output Summary (Last 24 hours) at 10/17/2021 0836 Last data filed at 10/17/2021 0758 Gross per 24 hour  Intake 590 ml  Output --  Net 590 ml      Pressure Injury 10/11/21 Buttocks Right;Left Deep Tissue Pressure Injury - Purple or maroon localized area of discolored intact skin or blood-filled blister due to damage of underlying soft tissue from pressure and/or shear. about 1x1 bilateral bottocks  (Active)  10/11/21 0735  Location: Buttocks  Location Orientation: Right;Left  Staging: Deep Tissue Pressure Injury - Purple or maroon localized area of discolored intact skin or blood-filled blister due to damage of underlying soft tissue from pressure and/or shear.  Wound Description (Comments): about 1x1 bilateral bottocks purple color  Present on Admission:      Pressure Injury 10/11/21 Buttocks Right;Left Stage 1 -  Intact skin with non-blanchable redness of a localized area usually over a bony prominence. redness 6x7 bilateral bottocks (Active)  10/11/21 0735  Location: Buttocks  Location Orientation: Right;Left  Staging: Stage 1 -  Intact skin with non-blanchable redness of a localized area usually over a bony prominence.  Wound Description  (Comments): redness 6x7 bilateral bottocks  Present on Admission:     Physical Exam: BP 93/67 (BP Location: Left Arm)   Pulse 65   Temp 98.3 F (36.8 C) (Oral)   Resp 18   Ht $R'5\' 5"'dH$  (1.651 m)   Wt 55.1 kg   LMP 08/04/2013   SpO2 95%   BMI 20.21 kg/m  Constitutional: No distress . Vital signs reviewed. HENT: Normocephalic.  Atraumatic. Neck: + C-collar.  General: No acute distress Mood and affect are appropriate Heart: Regular rate and rhythm no rubs murmurs or extra sounds Lungs: Clear to auscultation, breathing unlabored, no rales or wheezes Abdomen: unable to examine with TLSO on Extremities: No clubbing, cyanosis, or edema Skin: No evidence of breakdown, no evidence of rash + TLSO. Neuro: Alert Motor: B/l UE: 5/5 proximal to distal RLE:4--4/5 proximal to distal LLE:HF, KE 4-/5, ADF 2/5, sl improved   Assessment/Plan: 1. Functional deficits which require 3+ hours per day of interdisciplinary therapy in a comprehensive inpatient rehab setting. Physiatrist is providing close team supervision and 24 hour management of active medical problems listed below. Physiatrist and rehab team continue to assess barriers to discharge/monitor patient progress toward functional and medical goals   Care Tool:  Bathing    Body parts bathed by patient: Right arm, Left arm, Chest, Abdomen, Right upper leg, Left upper leg, Right lower leg, Left lower leg, Face     Body parts n/a: Front perineal area, Buttocks (pt reported nursing performed peri-care prior to OT)   Bathing assist  Assist Level: Moderate Assistance - Patient 50 - 74%     Upper Body Dressing/Undressing Upper body dressing   What is the patient wearing?: Pull over shirt, Orthosis (TLSO & C-Collar) Orthosis activity level: Performed by helper  Upper body assist Assist Level: Minimal Assistance - Patient > 75%    Lower Body Dressing/Undressing Lower body dressing      What is the patient wearing?: Pants, Incontinence  brief, Orthosis     Lower body assist Assist for lower body dressing: Maximal Assistance - Patient 25 - 49%     Toileting Toileting    Toileting assist Assist for toileting: Maximal Assistance - Patient 25 - 49%     Transfers Chair/bed transfer  Transfers assist     Chair/bed transfer assist level: Minimal Assistance - Patient > 75% Chair/bed transfer assistive device: Programmer, multimedia   Ambulation assist      Assist level: Minimal Assistance - Patient > 75% Assistive device: Walker-rolling Max distance: 33 ft   Walk 10 feet activity   Assist     Assist level: Minimal Assistance - Patient > 75% Assistive device: Walker-rolling   Walk 50 feet activity   Assist Walk 50 feet with 2 turns activity did not occur: Safety/medical concerns (decreased pain/activity tolerance)         Walk 150 feet activity   Assist Walk 150 feet activity did not occur: Safety/medical concerns         Walk 10 feet on uneven surface  activity   Assist Walk 10 feet on uneven surfaces activity did not occur: Safety/medical concerns         Wheelchair     Assist Is the patient using a wheelchair?: Yes Type of Wheelchair: Manual    Wheelchair assist level: Supervision/Verbal cueing Max wheelchair distance: >150 ft    Wheelchair 50 feet with 2 turns activity    Assist        Assist Level: Supervision/Verbal cueing   Wheelchair 150 feet activity     Assist      Assist Level: Supervision/Verbal cueing    Medical Problem List and Plan: 1.  Deficits with mobility, transfers, self-care secondary to polytrauma.THoracolumbar epidural hematoma  Continue CIR PT, OT 2.  Antithrombotics: -DVT/anticoagulation:  Pharmaceutical: Lovenox added 10/22             -antiplatelet therapy:  3. Pain Management: PRN meds  Will be cautious with medications due to history of drug abuse  Patient will require significant encouragement and  reassurance  Relatively controlled on 10/27 4. Mood: Celexa started on 10/26             -antipsychotic agents: N/A as delirium tremens has resolved.  5. Neuropsych: This patient is capable of making decisions on her own behalf. 6. Skin/Wound Care: Routine pressure relief measures.  7. Fluids/Electrolytes/Nutrition: Monitor I/Os 8. MSSA bacteremia on IV cefazolin for 6 weeks--end date 11/17/21 as endocarditis not ruled out.  9. Fatty liver: Abnormal LFTs resolved except for elevated A phos.    Alk phos improving on 10/26 10. Acute on chronic hyponatremia: Continue to monitor Na with serial checks.              Sodium 131 on 10/26  Continue to monitor 11. Thrombocytosis: Likely reactive.  Platelets 544 on 10/26 12. Hypoalbuminemia: Albumin level- 2.4.  13. Lytic lesions right tibia: Will need outpatient work up. 14.  C7 fracture: Aspen collar on at all times.  15. T1, T2  and  T11 deformities/fractures/hematoma: TLSO when OOB 16. ABLA: Monitor H/H with serial checks. Hgb 11-12 range. WBC stable.              Hemoglobin 11.1 on 10/26  Labs ordered for tomorrow 17.  Urinary frequency  UA unremarkable, urine culture pending 18.  Left foot drop  PRAFO nightly  Will likely require AFO 19.  Vitamin D deficiency  Supplement started on 10/27  LOS: 3 days A FACE TO FACE EVALUATION WAS PERFORMED  Charlett Blake 10/17/2021, 8:36 AM

## 2021-10-17 NOTE — Progress Notes (Signed)
Physical Therapy Session Note  Patient Details  Name: Martha Campbell MRN: [id number] Date of Birth: 1976-01-24  Today's Date: 10/17/2021 PT Individual Time: 0805-0859 PT Individual Time Calculation (min): 54 min  and Today's Date: 10/17/2021 PT Missed Time: 30 Minutes Missed Time Reason: Patient unwilling to participate;Pain  Short Term Goals: Week 1:  PT Short Term Goal 1 (Week 1): Patient will perform bed mobility with min A in a flat bed without use of bed rails with improved pain tolerance by >2 points on 10 point pain scale (8/10 with rolling on eval). PT Short Term Goal 2 (Week 1): Patient will perform basic transfers with CGA consistently from various household surfaces using LRAD. PT Short Term Goal 3 (Week 1): Patient with ambulate >100 feet with CGA using LRAD. PT Short Term Goal 4 (Week 1): Patient will tolerate sitting OOB >30 min between therapy sessions 2/5 days this week.  Skilled Therapeutic Interventions/Progress Updates:     Pt received supine in bed and agrees to therapy. Reports 2/10 pain in low back. PT provides bracing and rest breaks as needed to manage pain. Supine to sit with bed rails and logrolling technique, with no verbal cues required to complete. PT provides modA to don TLSO while seated at EOB. Sit to stand and stand step transfer to [facility] with CGA and cues for sequencing. Pt self propels WC x150' with bilateral upper extremities for strength and endurance training, with verbal cues for increased efficiency of propulsion. Pt performs multiple reps of sit to stand with CGA and cues for hand placement and sequencing. Pt ambulates x65' with RW and CGA, with verbal cues to decrease WB through RW for energy conservation. Following extended seated rest break, pt stands to RW and speaks with MD for several minutes as he assesses her status and vitals. Following MD visit, pt ambulates additional 65', though reports increase in back pain toward end of ambulation. PT encourages pt  to ambulate additional bouts but pt says that she needs to rest due to pain and feeling "overheated." WC transport back to room. Pt performs stand step transfer from [facility] to elevated toilet with minA and cues for sequencing. Pt is continent of urine and independent with pericare. Stand pivot back to WC with minA. Pt appears to be experiencing increased pain toward end of session and is less talkative with therapist. Left seated in [facility] with all needs with in reach. RN notified of increase in pain and pt requesting pain medication.  2nd Session: Pt received supine in bed. Says she cannot participate in therapy at this time due to pain. Says she likely "overdid it" in morning session. PT will follow up as appropriate.   Therapy Documentation Precautions:  Precautions Precautions: Cervical, Back Precaution Booklet Issued: Yes (comment) (from acute) Required Braces or Orthoses: Cervical Brace, Spinal Brace Cervical Brace: Hard collar, At all times Spinal Brace: Thoracolumbosacral orthotic, Applied in sitting position Restrictions Weight Bearing Restrictions: No   Therapy/Group: Individual Therapy  Breck Coons, PT, DPT 10/17/2021, 3:53 PM

## 2021-10-18 LAB — GLUCOSE, CAPILLARY: Glucose-Capillary: 109 mg/dL — ABNORMAL HIGH (ref 70–99)

## 2021-10-18 MED ORDER — SODIUM CHLORIDE 0.9 % IV SOLN: INTRAVENOUS | Status: DC | PRN

## 2021-10-18 MED ORDER — METHOCARBAMOL 500 MG PO TABS
1000.0000 mg | ORAL_TABLET | Freq: Three times a day (TID) | ORAL | Status: DC
  Administered 2021-10-18 – 2021-10-24 (×20): 1000 mg via ORAL
  Filled 2021-10-18 (×20): qty 2

## 2021-10-18 NOTE — Progress Notes (Signed)
Physical Therapy Session Note  Patient Details  Name: Martha Campbell MRN: [id number] Date of Birth: 07/22/1976  Today's Date: 10/18/2021 PT Individual Time: 0355-9741 PT Individual Time Calculation (min): 69 min   Short Term Goals: Week 1:  PT Short Term Goal 1 (Week 1): Patient will perform bed mobility with min A in a flat bed without use of bed rails with improved pain tolerance by >2 points on 10 point pain scale (8/10 with rolling on eval). PT Short Term Goal 2 (Week 1): Patient will perform basic transfers with CGA consistently from various household surfaces using LRAD. PT Short Term Goal 3 (Week 1): Patient with ambulate >100 feet with CGA using LRAD. PT Short Term Goal 4 (Week 1): Patient will tolerate sitting OOB >30 min between therapy sessions 2/5 days this week.  Skilled Therapeutic Interventions/Progress Updates:    Patient in supine and reports knee brace here but not concerned about it.  Applied and noted too large as pt reports 100# weight loss since using it.  Patient rolled and side to sit with rail and CGA.  Patient seated to don TLSO with total A.  Patient sit to stand to RW with min A.  Stand step to w/c with CGA.  Patient in w/c propelled with S x 220' to ortho gym with rest break on elevator.  Patient educated in car transfer technique and reports plans to go home in [place].  Performed transfer in car simulator to small SUV simulated height (24") with RW ambulatory with CGA and cues for scooting and moving hips prior to bringing legs in or out to avoid twisting. Patient performed seated ankle DF as much as able on L and on R, then side step ups at counter to 6" step on R and 4" step on L x 5 reps with c/o increased back pain and seated rest in between.  Patient ambulated 120' with RW and min/CGA.  Propelled w/c x 300' with 2 stops to rest and c/o increased pain so RN made aware and assist pt in w/c to room.  Sit to stand and stand step to bed with RW and CGA.  Seated pt  educated on brace doffing and pt performed about 25%.  Sit to sidelying with CGA to make sure feet cleared EOB.  Cues for positioning in bed.  Left in supine with call bell and needs in reach and bed alarm active and mother in the room.   Therapy Documentation Precautions:  Precautions Precautions: Cervical, Back Precaution Booklet Issued: Yes (comment) (from acute) Required Braces or Orthoses: Cervical Brace, Spinal Brace Cervical Brace: Hard collar, At all times Spinal Brace: Thoracolumbosacral orthotic, Applied in sitting position Restrictions Weight Bearing Restrictions: No  Pain: Pain Assessment Pain Scale: 0-10 Pain Score: 5  Pain Type: Acute pain Pain Location: Back Pain Orientation: Mid;Upper;Lower Pain Descriptors / Indicators: Crying;Guarding Pain Onset: With Activity Pain Intervention(s): Rest;RN made aware;Repositioned    Therapy/Group: Individual Therapy  Reginia Naas Magda Kiel, PT 10/18/2021, 12:32 PM

## 2021-10-18 NOTE — Progress Notes (Signed)
Occupational Therapy Session Note  Patient Details  Name: Martha Campbell MRN: [id number] Date of Birth: 06/21/1976  Today's Date: 10/18/2021 OT Individual Time: 1500-1545 OT Individual Time Calculation (min): 45 min    Short Term Goals: Week 1:  OT Short Term Goal 1 (Week 1): Pt will complete LB dressing with MIN A & LRA OT Short Term Goal 2 (Week 1): Pt will perform grooming standing with LRAD & min A OT Short Term Goal 3 (Week 1): Pt will transfer with LRAD to [facility] with CGA  Skilled Therapeutic Interventions/Progress Updates:     Pt received in bed tearful with unrated back pain.Pt already received a muscle relaxer and unable to receive Pain medication till after 4pm. Pt agreeable to OT with significant rest breaks provided PRN.   Pt requires total A to don brace. Overall when given the opportunity to pull support strap, pt pulls it an in and then winces. Pt completes all transfers EOB<>w/c<>TTB with CGA and VC for hand placement & options to improve tub/bathroom safety. Pt throughout is very self limiting because of pain. Pt states,"My backs not just fractured, It is broken. I'm not stupid." And "I just feel like no one cares about my pain. They just want me to do what they want me to do. I dont kno why everyone is asking me to walk a mile. I understand doing bed exercises but everything else is too much." Provided empathetic listening and explained that leg exercises will not improve functional independence and if left to only that for tx, mobility will only become harder or more painful. Also stated that pain will still be present when pt goes home and we need to know that she can do the things she needs to do to take care of herself even with the pain. Educated pt that if she is unable to handle this level of care and reach a functional level she will need to DC SNF as unsure of how much help mother can provide.   Pt left at end of session in bed with exit alarm on, call light in reach and  all needs met Pt missed 15 min skilled OT d/t pain.   Therapy Documentation Precautions:  Precautions Precautions: Cervical, Back Precaution Booklet Issued: Yes (comment) (from acute) Required Braces or Orthoses: Cervical Brace, Spinal Brace Cervical Brace: Hard collar, At all times Spinal Brace: Thoracolumbosacral orthotic, Applied in sitting position Restrictions Weight Bearing Restrictions: No General:     Therapy/Group: Individual Therapy  Tonny Branch 10/18/2021, 6:54 AM

## 2021-10-18 NOTE — Progress Notes (Signed)
Pt. Made aware of 72H with no BM. Refused miralax. Pt stated she will wait till tomorrow if no bm reported. Educated pt. On the risk of not moving her bowels.

## 2021-10-18 NOTE — Progress Notes (Signed)
Physical Therapy Session Note  Patient Details  Name: Martha Campbell MRN: [id number] Date of Birth: [birth year]  Today's Date: 10/18/2021 PT Individual Time: 6270-3500 PT Individual Time Calculation (min): 72 min   Short Term Goals: Week 1:  PT Short Term Goal 1 (Week 1): Patient will perform bed mobility with min A in a flat bed without use of bed rails with improved pain tolerance by >2 points on 10 point pain scale (8/10 with rolling on eval). PT Short Term Goal 2 (Week 1): Patient will perform basic transfers with CGA consistently from various household surfaces using LRAD. PT Short Term Goal 3 (Week 1): Patient with ambulate >100 feet with CGA using LRAD. PT Short Term Goal 4 (Week 1): Patient will tolerate sitting OOB >30 min between therapy sessions 2/5 days this week.  Skilled Therapeutic Interventions/Progress Updates:  Patient supine in bed with [place] J hard collar donned on entrance to room. IV medication running. Patient alert and agreeable to PT session. Pt anxious re: session and asking immediately, "What are we going to do?" Pt calmed and informed that therapist is aware of pt's recent experience with increased pain following therapy session. It is important following pt's recent injuries that she participate in therapy sessions and pt has two more sessions this day. This session will focus on use of abdominal bracing prior to mobilizing in order to strengthen core for potential reduction in pain with movement.   Patient with 2/ 10 pain on entrance to room. Pain increases to 5/10 with upright positioning seated EOB and in standing.   Therapeutic Activity: Bed Mobility: Prior to mobility, pt guided in abdominal contraction to point of holding trunk position but continuing ability to breathe normally. Patient performed supine <> sit with good technique in log roll with initial position in hooklying, rolling with hips and shoulders in line all with supervision. Pt does require Min/ ModA  to push up from bed surface this session. Once seated EOB, pt can maintain position for ~1 to <11min before c/o increasing back pain and pain in L thigh. TLSO donned MaxA in sitting. VC provided throughout for maintaining abdominal bracing to assist with trunk stability.   IV nurse arriving to disconnect IV medication at start of session.   TLSO doffed at end of session when returned to seated position at EOB. Return to supine performed with pt requesting full supervision and assist as needed to ensure proper technique. Pt performs with supervision assist and demos good technique throughout.  Transfers: Patient performed sit<>stand and stand pivot transfers throughout session with close supervision and intermittent CGA. Provided verbal cues for abdominal bracing prior to transfer performance.  Gait Training:  Patient ambulated 80' x1/ 10' x2 using RW with close supervision/ CGA. Demonstrated step through pattern with hesitant steps. Provided vc/ tc for maintaining smooth, successive pace. Foot drop noted on LLE and light steppage gait pattern required for foot clearance. 0/5 noted with AROM dorsiflexors and everters this session. Discussion of use of PRAFO at end of session during rest periods, as well as overnight in order to prevent PF contracture.   Patient supine  in bed at end of session with brakes locked, bed alarm set, and all needs within reach. Oriented to time and time of next therapy session. Recommended to rest in order to participate in next session which will require more activity from pt.      Therapy Documentation Precautions:  Precautions Precautions: Cervical, Back Precaution Booklet Issued: Yes (comment) (from acute) Required  Braces or Orthoses: Cervical Brace, Spinal Brace Cervical Brace: Hard collar, At all times Spinal Brace: Thoracolumbosacral orthotic, Applied in sitting position Restrictions Weight Bearing Restrictions: No General:   Pain: Pain Assessment Pain  Scale: 0-10 Pain Score: 5   Therapy/Group: Individual Therapy  Alger Simons PT, DPT 10/18/2021, 12:24 PM

## 2021-10-19 NOTE — Progress Notes (Signed)
[facility] PHYSICAL MEDICINE & REHABILITATION PROGRESS NOTE  Subjective/Complaints:  Pt reports muscle spasms were still "awful" as of Saturday, however much better when increased robaxin to 1000 mg TID from 500 mg TID prn.   LBM this AM- ate better today as well-   ROS:  Pt denies SOB, abd pain, CP, N/V/C/D, and vision changes  Objective: Vital Signs: Blood pressure 97/65, pulse 80, temperature 98.3 F (36.8 C), resp. rate 16, height $RemoveBe'5\' 5"'vWRAgIBVO$  (1.651 m), weight 55.1 kg, last menstrual period 08/04/2013, SpO2 95 %. No results found. Recent Labs    10/17/21 0445  WBC 5.3  HGB 11.0*  HCT 33.3*  PLT 458*   No results for input(s): NA, K, CL, CO2, GLUCOSE, BUN, CREATININE, CALCIUM in the last 72 hours.  Intake/Output Summary (Last 24 hours) at 10/19/2021 1700 Last data filed at 10/19/2021 1355 Gross per 24 hour  Intake 592.15 ml  Output --  Net 592.15 ml     Pressure Injury 10/11/21 Buttocks Right;Left Deep Tissue Pressure Injury - Purple or maroon localized area of discolored intact skin or blood-filled blister due to damage of underlying soft tissue from pressure and/or shear. about 1x1 bilateral bottocks  (Active)  10/11/21 0735  Location: Buttocks  Location Orientation: Right;Left  Staging: Deep Tissue Pressure Injury - Purple or maroon localized area of discolored intact skin or blood-filled blister due to damage of underlying soft tissue from pressure and/or shear.  Wound Description (Comments): about 1x1 bilateral bottocks purple color  Present on Admission:      Pressure Injury 10/11/21 Buttocks Right;Left Stage 1 -  Intact skin with non-blanchable redness of a localized area usually over a bony prominence. redness 6x7 bilateral bottocks (Active)  10/11/21 0735  Location: Buttocks  Location Orientation: Right;Left  Staging: Stage 1 -  Intact skin with non-blanchable redness of a localized area usually over a bony prominence.  Wound Description (Comments): redness 6x7  bilateral bottocks  Present on Admission:     Physical Exam: BP 97/65 (BP Location: Left Arm)   Pulse 80   Temp 98.3 F (36.8 C)   Resp 16   Ht $R'5\' 5"'BL$  (1.651 m)   Wt 55.1 kg   LMP 08/04/2013   SpO2 95%   BMI 20.21 kg/m   General: awake, alert, appropriate, brighter affect today- eating lunch; NAD HENT: conjugate gaze; oropharynx moist C collar in place CV: regular rate; no JVD Pulmonary: CTA B/L; no W/R/R- good air movement GI: soft, NT, ND, (+)BS- hypoactive- just had BM Psychiatric: appropriate- less irritable due to pain Neurological: Ox3  Extremities: No clubbing, cyanosis, or edema Skin: No evidence of breakdown, no evidence of rash + TLSO. Neuro: Alert Motor: B/l UE: 5/5 proximal to distal RLE:4--4/5 proximal to distal LLE:HF, KE 4-/5, ADF 2/5, sl improved   Assessment/Plan: 1. Functional deficits which require 3+ hours per day of interdisciplinary therapy in a comprehensive inpatient rehab setting. Physiatrist is providing close team supervision and 24 hour management of active medical problems listed below. Physiatrist and rehab team continue to assess barriers to discharge/monitor patient progress toward functional and medical goals   Care Tool:  Bathing  Bathing activity did not occur: Refused Body parts bathed by patient: Right arm, Left arm, Chest, Abdomen, Right upper leg, Left upper leg, Right lower leg, Left lower leg, Face     Body parts n/a: Front perineal area, Buttocks (pt reported nursing performed peri-care prior to OT)   Bathing assist Assist Level: Moderate Assistance - Patient 50 -  74%     Upper Body Dressing/Undressing Upper body dressing Upper body dressing/undressing activity did not occur (including orthotics): Refused What is the patient wearing?: Pull over shirt, Orthosis (TLSO & C-Collar) Orthosis activity level: Performed by helper  Upper body assist Assist Level: Minimal Assistance - Patient > 75%    Lower Body  Dressing/Undressing Lower body dressing    Lower body dressing activity did not occur: Refused What is the patient wearing?: Pants, Incontinence brief, Orthosis     Lower body assist Assist for lower body dressing: Maximal Assistance - Patient 25 - 49%     Toileting Toileting    Toileting assist Assist for toileting: Maximal Assistance - Patient 25 - 49% (bedpan)     Transfers Chair/bed transfer  Transfers assist     Chair/bed transfer assist level: Minimal Assistance - Patient > 75% Chair/bed transfer assistive device: Programmer, multimedia   Ambulation assist      Assist level: Minimal Assistance - Patient > 75% Assistive device: Walker-rolling Max distance: 33 ft   Walk 10 feet activity   Assist     Assist level: Minimal Assistance - Patient > 75% Assistive device: Walker-rolling   Walk 50 feet activity   Assist Walk 50 feet with 2 turns activity did not occur: Safety/medical concerns (decreased pain/activity tolerance)         Walk 150 feet activity   Assist Walk 150 feet activity did not occur: Safety/medical concerns         Walk 10 feet on uneven surface  activity   Assist Walk 10 feet on uneven surfaces activity did not occur: Safety/medical concerns         Wheelchair     Assist Is the patient using a wheelchair?: Yes Type of Wheelchair: Manual    Wheelchair assist level: Supervision/Verbal cueing Max wheelchair distance: >150 ft    Wheelchair 50 feet with 2 turns activity    Assist        Assist Level: Supervision/Verbal cueing   Wheelchair 150 feet activity     Assist      Assist Level: Supervision/Verbal cueing    Medical Problem List and Plan: 1.  Deficits with mobility, transfers, self-care secondary to polytrauma.THoracolumbar epidural hematoma  Con't PT and OT/CIR 2.  Antithrombotics: -DVT/anticoagulation:  Pharmaceutical: Lovenox added 10/22             -antiplatelet therapy:   3. Pain Management: PRN meds  Will be cautious with medications due to history of drug abuse  Patient will require significant encouragement and reassurance  Relatively controlled on 10/27  10/30- increase robaxin to 1000 mg TID- pain much better per pt. -con't regimen 4. Mood: Celexa started on 10/26             -antipsychotic agents: N/A as delirium tremens has resolved.  5. Neuropsych: This patient is capable of making decisions on her own behalf. 6. Skin/Wound Care: Routine pressure relief measures.  7. Fluids/Electrolytes/Nutrition: Monitor I/Os 8. MSSA bacteremia on IV cefazolin for 6 weeks--end date 11/17/21 as endocarditis not ruled out.  9. Fatty liver: Abnormal LFTs resolved except for elevated A phos.    Alk phos improving on 10/26 10. Acute on chronic hyponatremia: Continue to monitor Na with serial checks.              Sodium 131 on 10/26  Continue to monitor 11. Thrombocytosis: Likely reactive.  Platelets 544 on 10/26  10/30- Plts down to 458 on 10/28- will recheck  weekly.  12. Hypoalbuminemia: Albumin level- 2.4.  13. Lytic lesions right tibia: Will need outpatient work up. 14.  C7 fracture: Aspen collar on at all times.  15. T1, T2  and T11 deformities/fractures/hematoma: TLSO when OOB 16. ABLA: Monitor H/H with serial checks. Hgb 11-12 range. WBC stable.              Hemoglobin 11.1 on 10/26  10/30- Hb 11.0 - check weekly 17.  Urinary frequency  UA unremarkable, urine culture pending 18.  Left foot drop  PRAFO nightly  Will likely require AFO 19.  Vitamin D deficiency  Supplement started on 10/27 20. Constipation  10/30- LBM this am- con't regimen  LOS: 5 days A FACE TO FACE EVALUATION WAS PERFORMED  Martha Campbell 10/19/2021, 5:00 PM

## 2021-10-20 DIAGNOSIS — K5901 Slow transit constipation: Secondary | ICD-10-CM

## 2021-10-20 LAB — CBC WITH DIFFERENTIAL/PLATELET
Abs Immature Granulocytes: 0 10*3/uL (ref 0.00–0.07)
Basophils Absolute: 0.1 10*3/uL (ref 0.0–0.1)
Basophils Relative: 2 %
Eosinophils Absolute: 0.2 10*3/uL (ref 0.0–0.5)
Eosinophils Relative: 4 %
HCT: 33.4 % — ABNORMAL LOW (ref 36.0–46.0)
Hemoglobin: 11.2 g/dL — ABNORMAL LOW (ref 12.0–15.0)
Immature Granulocytes: 0 %
Lymphocytes Relative: 41 %
Lymphs Abs: 2.1 10*3/uL (ref 0.7–4.0)
MCH: 32.9 pg (ref 26.0–34.0)
MCHC: 33.5 g/dL (ref 30.0–36.0)
MCV: 98.2 fL (ref 80.0–100.0)
Monocytes Absolute: 0.4 10*3/uL (ref 0.1–1.0)
Monocytes Relative: 8 %
Neutro Abs: 2.3 10*3/uL (ref 1.7–7.7)
Neutrophils Relative %: 45 %
Platelets: 341 10*3/uL (ref 150–400)
RBC: 3.4 MIL/uL — ABNORMAL LOW (ref 3.87–5.11)
RDW: 15.2 % (ref 11.5–15.5)
WBC: 5.1 10*3/uL (ref 4.0–10.5)
nRBC: 0 % (ref 0.0–0.2)

## 2021-10-20 LAB — BASIC METABOLIC PANEL
Anion gap: 6 (ref 5–15)
BUN: <5 mg/dL — ABNORMAL LOW (ref 6–20)
CO2: 25 mmol/L (ref 22–32)
Calcium: 9.1 mg/dL (ref 8.9–10.3)
Chloride: 105 mmol/L (ref 98–111)
Creatinine, Ser: 0.44 mg/dL (ref 0.44–1.00)
GFR, Estimated: >60 mL/min (ref >60)
Glucose, Bld: 93 mg/dL (ref 70–99)
Potassium: 3.7 mmol/L (ref 3.5–5.1)
Sodium: 136 mmol/L (ref 135–145)

## 2021-10-20 NOTE — Progress Notes (Signed)
[place] PHYSICAL MEDICINE & REHABILITATION PROGRESS NOTE  Subjective/Complaints:  Pain, spasms much better controlled. No complaints this morning. Had a busy day of therapy already and feels she did well  ROS: Patient denies fever, rash, sore throat, blurred vision, nausea, vomiting, diarrhea, cough, shortness of breath or chest pain, joint or back pain, headache, or mood change.   Objective: Vital Signs: Blood pressure (!) 93/56, pulse 67, temperature 98.3 F (36.8 C), temperature source Oral, resp. rate 18, height $RemoveBe'5\' 5"'bMDTZOjlo$  (1.651 m), weight 57.7 kg, last menstrual period 08/04/2013, SpO2 97 %. No results found. Recent Labs    10/20/21 0324  WBC 5.1  HGB 11.2*  HCT 33.4*  PLT 341   Recent Labs    10/20/21 0324  NA 136  K 3.7  CL 105  CO2 25  GLUCOSE 93  BUN <5*  CREATININE 0.44  CALCIUM 9.1    Intake/Output Summary (Last 24 hours) at 10/20/2021 1327 Last data filed at 10/20/2021 1315 Gross per 24 hour  Intake 547 ml  Output --  Net 547 ml     Pressure Injury 10/11/21 Buttocks Right;Left Deep Tissue Pressure Injury - Purple or maroon localized area of discolored intact skin or blood-filled blister due to damage of underlying soft tissue from pressure and/or shear. about 1x1 bilateral bottocks  (Active)  10/11/21 0735  Location: Buttocks  Location Orientation: Right;Left  Staging: Deep Tissue Pressure Injury - Purple or maroon localized area of discolored intact skin or blood-filled blister due to damage of underlying soft tissue from pressure and/or shear.  Wound Description (Comments): about 1x1 bilateral bottocks purple color  Present on Admission:      Pressure Injury 10/11/21 Buttocks Right;Left Stage 1 -  Intact skin with non-blanchable redness of a localized area usually over a bony prominence. redness 6x7 bilateral bottocks (Active)  10/11/21 0735  Location: Buttocks  Location Orientation: Right;Left  Staging: Stage 1 -  Intact skin with non-blanchable  redness of a localized area usually over a bony prominence.  Wound Description (Comments): redness 6x7 bilateral bottocks  Present on Admission:     Physical Exam: BP (!) 93/56 (BP Location: Left Arm)   Pulse 67   Temp 98.3 F (36.8 C) (Oral)   Resp 18   Ht $R'5\' 5"'vQ$  (1.651 m)   Wt 57.7 kg   LMP 08/04/2013   SpO2 97%   BMI 21.17 kg/m   Constitutional: No distress . Vital signs reviewed. HEENT: NCAT, EOMI, oral membranes moist Neck: supple, [place] J Cardiovascular: RRR without murmur. No JVD    Respiratory/Chest: CTA Bilaterally without wheezes or rales. Normal effort    GI/Abdomen: BS +, non-tender, non-distended Ext: no clubbing, cyanosis, or edema Psych: pleasant and cooperative  Extremities: No clubbing, cyanosis, or edema Skin: buttocks wounds   Neuro: Alert Motor: B/l UE: 5/5 proximal to distal RLE:4--4/5 proximal to distal LLE:HF, KE 4-/5, ADF 2/5,stable   Assessment/Plan: 1. Functional deficits which require 3+ hours per day of interdisciplinary therapy in a comprehensive inpatient rehab setting. Physiatrist is providing close team supervision and 24 hour management of active medical problems listed below. Physiatrist and rehab team continue to assess barriers to discharge/monitor patient progress toward functional and medical goals   Care Tool:  Bathing  Bathing activity did not occur: Refused Body parts bathed by patient: Right arm, Left arm, Chest, Abdomen, Right upper leg, Left upper leg, Right lower leg, Left lower leg, Face     Body parts n/a: Front perineal area, Buttocks (pt reported  nursing performed peri-care prior to OT)   Bathing assist Assist Level: Moderate Assistance - Patient 50 - 74%     Upper Body Dressing/Undressing Upper body dressing Upper body dressing/undressing activity did not occur (including orthotics): Refused What is the patient wearing?: Pull over shirt, Orthosis (TLSO & C-Collar) Orthosis activity level: Performed by helper   Upper body assist Assist Level: Minimal Assistance - Patient > 75%    Lower Body Dressing/Undressing Lower body dressing    Lower body dressing activity did not occur: Refused What is the patient wearing?: Pants, Incontinence brief, Orthosis     Lower body assist Assist for lower body dressing: Maximal Assistance - Patient 25 - 49%     Toileting Toileting    Toileting assist Assist for toileting: Maximal Assistance - Patient 25 - 49% (bedpan)     Transfers Chair/bed transfer  Transfers assist     Chair/bed transfer assist level: Minimal Assistance - Patient > 75% Chair/bed transfer assistive device: Programmer, multimedia   Ambulation assist      Assist level: Minimal Assistance - Patient > 75% Assistive device: Walker-rolling Max distance: 33 ft   Walk 10 feet activity   Assist     Assist level: Minimal Assistance - Patient > 75% Assistive device: Walker-rolling   Walk 50 feet activity   Assist Walk 50 feet with 2 turns activity did not occur: Safety/medical concerns (decreased pain/activity tolerance)         Walk 150 feet activity   Assist Walk 150 feet activity did not occur: Safety/medical concerns         Walk 10 feet on uneven surface  activity   Assist Walk 10 feet on uneven surfaces activity did not occur: Safety/medical concerns         Wheelchair     Assist Is the patient using a wheelchair?: Yes Type of Wheelchair: Manual    Wheelchair assist level: Supervision/Verbal cueing Max wheelchair distance: >150 ft    Wheelchair 50 feet with 2 turns activity    Assist        Assist Level: Supervision/Verbal cueing   Wheelchair 150 feet activity     Assist      Assist Level: Supervision/Verbal cueing    Medical Problem List and Plan: 1.  Deficits with mobility, transfers, self-care secondary to polytrauma.THoracolumbar epidural hematoma  -Continue CIR therapies including PT, OT  2.   Antithrombotics: -DVT/anticoagulation:  Pharmaceutical: Lovenox added 10/22             -antiplatelet therapy:  3. Pain Management: PRN meds  Will be cautious with medications due to history of drug abuse  Patient will require significant encouragement and reassurance  Relatively controlled on 10/27 10/31- increased robaxin to 1000 mg TID- pain much better per pt. -con't regimen 4. Mood: Celexa started on 10/26             -antipsychotic agents: N/A as delirium tremens has resolved.  5. Neuropsych: This patient is capable of making decisions on her own behalf. 6. Skin/Wound Care: Routine pressure relief measures.  7. Fluids/Electrolytes/Nutrition: Monitor I/Os 8. MSSA bacteremia on IV cefazolin for 6 weeks--end date 11/17/21 as endocarditis not ruled out.  9. Fatty liver: Abnormal LFTs resolved except for elevated A phos.    Alk phos improving on 10/26 10. Acute on chronic hyponatremia: Continue to monitor Na with serial checks.              Sodium 136 on 10/31  Continue to  monitor 11. Thrombocytosis: Likely reactive.  Platelets 544 on 10/26  10/31- Plts down to 341.  12. Hypoalbuminemia: Albumin level- 2.4.  13. Lytic lesions right tibia: Will need outpatient work up. 14.  C7 fracture: Aspen collar on at all times.  15. T1, T2  and T11 deformities/fractures/hematoma: TLSO when OOB 16. ABLA: Monitor H/H with serial checks. Hgb 11-12 range. WBC stable.              Hemoglobin 11.1 on 10/26  10/3`- Hb 11.2   17.  Urinary frequency  UA unremarkable, urine culture neg 18.  Left foot drop  PRAFO nightly  Will likely require AFO 19.  Vitamin D deficiency  Supplement started on 10/27 20. Constipation  10/30-  bm, con't regimen  LOS: 6 days A FACE TO FACE EVALUATION WAS PERFORMED  Meredith Staggers 10/20/2021, 1:27 PM

## 2021-10-20 NOTE — Progress Notes (Signed)
Physical Therapy Session Note  Patient Details  Name: Martha Campbell MRN: [id number] Date of Birth: [birth year]  Today's Date: 10/20/2021 PT Individual Time: 1133-1200 PT Individual Time Calculation (min): 27 min   Short Term Goals: Week 1:  PT Short Term Goal 1 (Week 1): Patient will perform bed mobility with min A in a flat bed without use of bed rails with improved pain tolerance by >2 points on 10 point pain scale (8/10 with rolling on eval). PT Short Term Goal 2 (Week 1): Patient will perform basic transfers with CGA consistently from various household surfaces using LRAD. PT Short Term Goal 3 (Week 1): Patient with ambulate >100 feet with CGA using LRAD. PT Short Term Goal 4 (Week 1): Patient will tolerate sitting OOB >30 min between therapy sessions 2/5 days this week.  Skilled Therapeutic Interventions/Progress Updates:     Pt received supine in bed and agrees to therapy. Pt performs bed mobility with verbal cues for logrolling technique. PT dons TLSO at EOB providing maxA. Stand pivot transfer to [facility] with RW and CGA, with cues for sequencing. WC transport to ADL apartment for time management. Pt performs household ambulation in ADL kitchen, using RW and with CGA and cues for RW management. Pt reaches up into cabinet to retrieve cooking supplies and then tasked with carrying supplies to table. PT cues for safety as pt attempts to carry all supplies at once, exceeding spinal precautions. Pt then able to carry with CGA and RW. Pt ambulates to recliner and performs stand<>sit in recliner with cues for body mechanics. PT educates on use of lumbar support if sitting in plush chairs at home. WC transport back to room. Stand pivot to bed with verbal cues for initiation. Pt left supine with alarm intact and all needs within reach.  Therapy Documentation Precautions:  Precautions Precautions: Cervical, Back Precaution Booklet Issued: Yes (comment) (from acute) Required Braces or Orthoses: Cervical  Brace, Spinal Brace Cervical Brace: Hard collar, At all times Spinal Brace: Thoracolumbosacral orthotic, Applied in sitting position Restrictions Weight Bearing Restrictions: No   Therapy/Group: Individual Therapy  Breck Coons, PT, DPT 10/20/2021, 3:50 PM

## 2021-10-20 NOTE — Progress Notes (Addendum)
Physical Therapy Session Note  Patient Details  Name: Martha Campbell MRN: [id number] Date of Birth: Jun 06, 1976  Today's Date: 10/20/2021 PT Individual Time: 0800-0900 PT Individual Time Calculation (min): 60 min   Short Term Goals: Week 1:  PT Short Term Goal 1 (Week 1): Patient will perform bed mobility with min A in a flat bed without use of bed rails with improved pain tolerance by >2 points on 10 point pain scale (8/10 with rolling on eval). PT Short Term Goal 2 (Week 1): Patient will perform basic transfers with CGA consistently from various household surfaces using LRAD. PT Short Term Goal 3 (Week 1): Patient with ambulate >100 feet with CGA using LRAD. PT Short Term Goal 4 (Week 1): Patient will tolerate sitting OOB >30 min between therapy sessions 2/5 days this week.  Skilled Therapeutic Interventions/Progress Updates:     Patient in bed with c-collar donned upon PT arrival. Patient alert and agreeable to PT session. Patient reported 5/10 back pain during session, RN made aware. PT provided repositioning, rest breaks, and distraction as pain interventions throughout session. Patient reports continued awakening at 3pm due to pain and unable to return to sleep, discussed night time pain management with RN. Patient reports that afternoon therapy sessions are difficult due to early waking time. Adjusted therapy schedule for early start between 7 and 7:30 and end at 2pm for improved patient participation, patient appreciative.   Therapeutic Activity: Bed Mobility: Patient performed supine to sit with supervision. Provided verbal cues for log roll technique, attempted with bed flat and without use of bed rails, as patient does not want to go home with a hospital bed, however, patient reports increased muscle spasms in flat position and requests use of bed rail, will continue to assess equipment needs. Donned TLSO with patient sitting EOB with total A, patient reports her mother can do this for  her at home.  Transfers: Patient performed stand pivot bed>w/c and sit to/from stand x3 with supervision and use of RW. Provided verbal cues for hand placement on RW and reaching back to sit for safety. Patient performed step-up on/off standing scale with CGA x2 to obtain patient's weight, 121.7 lbs.  Gait Training:  Patient ascended/descended 4x6" steps using B rails to simulate home set-up with CGA. Performed step-to gait pattern leading with R while ascending and L while descending. Provided cues for technique and sequencing.   Therapeutic Exercise: Patient performed the following exercises with verbal and tactile cues for proper technique. Patient propelled w/c >300 x2 feet using B upper extremities with rest break on elevators for upper extremity strengthening and endurance  Obtained break extenders for w/c and Foot-up brace for L DF assist with gait. Unable to place brace in tennis shoes due to style of shoes, patient to ask family to bring a different pair of shoes tomorrow that will allow proper lacing of brace in the shoe.   Patient in w/c handed off to [place], at end of session.   Therapy Documentation Precautions:  Precautions Precautions: Cervical, Back Precaution Booklet Issued: Yes (comment) (from acute) Required Braces or Orthoses: Cervical Brace, Spinal Brace Cervical Brace: Hard collar, At all times Spinal Brace: Thoracolumbosacral orthotic, Applied in sitting position Restrictions Weight Bearing Restrictions: No    Therapy/Group: Individual Therapy  Kimbely Whiteaker L Ravis Herne PT, DPT  10/20/2021, 2:58 PM

## 2021-10-20 NOTE — Progress Notes (Signed)
Occupational Therapy Session Note  Patient Details  Name: Martha Campbell MRN: [id number] Date of Birth: 1976-09-30  Today's Date: 10/20/2021 OT Individual Time: 0900-1000   &  1430-1510 OT Individual Time Calculation (min): 60 min   &  40 min Missed 20 minutes at end of pm session due to pain and fatigue   Short Term Goals: Week 1:  OT Short Term Goal 1 (Week 1): Pt will complete LB dressing with MIN A & LRA OT Short Term Goal 2 (Week 1): Pt will perform grooming standing with LRAD & min A OT Short Term Goal 3 (Week 1): Pt will transfer with LRAD to [facility] with CGA  Skilled Therapeutic Interventions/Progress Updates:    AM session:   Patient seated in w/c, ready for therapy session.  She notes that pain is under control at this time.  Completed face washing with set up, assisted with washing neck and changing pads in collar.  She stood from w/c and ambulated to sink with CGA to brush teeth.  Reviewed and practiced with reacher, sock aide, shoe funnel and long handled sponge for foot care and doffing/donning socks/shoes.  Discussed using assistive devices vs crossing legs to complete lower leg care - she was able to complete all tasks with set up both with and without devices.  Stand pivot transfer w/c to bed with CGA.  Able to doff TLSO with min A.  Sit to side lying to supine with CS.  Assisted with donning foot orthosis.  She remained in bed at close of session, bed alarm set and call bell in reach.     PM session:   patient in bed and in process of getting onto bed pan.   Total assist to place bed pan, mod A hygiene and mod A to place clean brief.  Discussed use of commode/toilet for future voids as she does not plan to have a bed pan at home.  Supine to sitting edge of bed with CS.  Mod A to donn TLSO.  Sit to stand and stand pivot transfer with RW to w/c with CGA.  Completed upper body ergometer for 2 minutes but discontinued due to shoulder discomfort.  Completed 20 foot walk with RW CGA.   Completed leg stretch and reviewed positioning for pain management with min relief.  She states pain is increasing during session due to fatigue and activity today requesting to go back to bed.  Returned to bed CGA.  Min A to doff TLSO.  CS to get to supine position with good carryover of safe back technique.  Max A to donn foot/ankle support and manage linens.  She remained in bed at close of session, bed alarm set and call bell in hand.       Therapy Documentation Precautions:  Precautions Precautions: Cervical, Back Precaution Booklet Issued: Yes (comment) (from acute) Required Braces or Orthoses: Cervical Brace, Spinal Brace Cervical Brace: Hard collar, At all times Spinal Brace: Thoracolumbosacral orthotic, Applied in sitting position Restrictions Weight Bearing Restrictions: No   Therapy/Group: Individual Therapy  Carlos Levering 10/20/2021, 7:40 AM

## 2021-10-20 NOTE — Progress Notes (Signed)
Patient awake at 3 am requesting coffee. Patient refusing CHG bath, educated on importance, stated she would do it after 8 am PT session, but not right now.

## 2021-10-20 NOTE — Discharge Instructions (Addendum)
Inpatient Rehab Discharge Instructions  Martha Campbell Discharge date and time:    Activities/Precautions/ Functional Status: Activity: no lifting, driving, or strenuous exercise  till cleared by MD Diet: regular diet Wound Care: keep wound clean and dry   Functional status:  ___ No restrictions     ___ Walk up steps independently _X__ 24/7 supervision/assistance   ___ Walk up steps with assistance ___ Intermittent supervision/assistance  ___ Bathe/dress independently ___ Walk with walker     ___ Bathe/dress with assistance ___ Walk Independently    ___ Shower independently ___ Walk with assistance    __X_ Shower with supervision/set up _X__ No alcohol     ___ Return to work/school ________    COMMUNITY REFERRALS UPON DISCHARGE:    Home Health:    RN                Agency: [facility] Phone:  [phone] *Please expect start of care to begin on 10/27/2021. If you have any questions or concerns, please be sure to contact the branch directly.*  Medical Equipment/Items Ordered: IV antibiotics (Ancef)                                                 Agency/Supplier: [facility] [phone]  Medical Equipment/Items Ordered: RW, w/c and tub transfer bench-has already; brake extensions (to be shipped to home)                                                 Agency/Supplier: [facility] [phone]    Special Instructions: Wear collar at all times. Wear TLSO/back brace when at edge of bed or out of bed.    My questions have been answered and I understand these instructions. I will adhere to these goals and the provided educational materials after my discharge from the hospital.  Patient/Caregiver Signature _______________________________ Date __________  Clinician Signature _______________________________________ Date __________  Please bring this form and your medication list with you to all your follow-up doctor's appointments.

## 2021-10-21 NOTE — Progress Notes (Signed)
Physical Therapy Session Note  Patient Details  Name: Martha Campbell MRN: [id number] Date of Birth: [birth year]  Today's Date: 10/21/2021 PT Individual Time: 0800-0900 and 2706-2376 PT Individual Time Calculation (min): 60 min and 55 min  Short Term Goals: Week 1:  PT Short Term Goal 1 (Week 1): Patient will perform bed mobility with min A in a flat bed without use of bed rails with improved pain tolerance by >2 points on 10 point pain scale (8/10 with rolling on eval). PT Short Term Goal 2 (Week 1): Patient will perform basic transfers with CGA consistently from various household surfaces using LRAD. PT Short Term Goal 3 (Week 1): Patient with ambulate >100 feet with CGA using LRAD. PT Short Term Goal 4 (Week 1): Patient will tolerate sitting OOB >30 min between therapy sessions 2/5 days this week.  Skilled Therapeutic Interventions/Progress Updates:     Session 1: Patient in bed upon PT arrival. Patient alert and agreeable to PT session. Patient reported 4/10 back pain during session, RN made aware. PT provided repositioning, rest breaks, and distraction as pain interventions throughout session. Reports improved sleep, waking at 2am to take pain meds, then returning to sleep until ~5:30am.   Therapeutic Activity: Bed Mobility: Patient performed supine to/from sit with supervision in a flat bed without use of bed rails. Lowered head of bed in small increments with rest breaks to reduce spasms and placed 4 pillows in wedge formation under patient to prevent lying completely flat due to increased pain in this position, patient tolerated well. Performed rolling R/L to determine which side was easier to get up from without the rail, determined L side-lying was preferred by patient using log roll technique with min cues. Donned TLSO in sitting EOB with max A and second gown over back with min A.  Discussed home set-up, accessing her house to see her dogs, not recommending at this time due to  significant unlevel surface to access home with steep steps. Patient reports her dogs can come to her mother's house to see her for visitation instead.   Transfers: Patient performed sit to/from stand x1 with supervision using RW.  Gait Training:  Patient ambulated 108 feet using RW with supervision and w/c follow due to decreased activity tolerance. Ambulated with decreased gait speed, steppage gait lacking DF on L, and mild forward trunk flexion. Provided verbal cues for erect posture and reaching her L heel forward to encourage increased step length and DF as able.  Wheelchair Mobility:  Patient propelled wheelchair 108 feet with mod I. Placed break extenders and patient able to use independently throughout session.   Patient required increased time and rest breaks to complete tasks during session due to verbose language and decreased pain tolerance.  Patient in w/c in the room at end of session with breaks locked and all needs within reach.   Session 2: Patient in w/c with her mother in the room upon PT arrival. Patient alert and agreeable to PT session. Patient reported 5/10 back and L hip pain during session, RN made aware. PT provided repositioning, rest breaks, and distraction as pain interventions throughout session.   Focused session on hands-on family education with patient's mother in preparation for d/c later this week. Patient's mother performed all mobility providing safe guarding technique throughout session. Educated on fall risk/prevention, home modifications to prevent falls, and activation of emergency services in the event of a fall during session. Recommended use of RW with all transfers and ambulation for household mobility  and w/c for community mobility. Discourage use of w/c in the home to increased functional mobility and ambulation to promote increased independence and continued strength and conditioning. Patient and her mother in agreement with all recommendations and  denied questions at this time.   Educated patient and her mother on donning Foot-Up brace on L  and donned B tennis shoes with brace with total A prior to mobility.  Therapeutic Activity: Bed Mobility: Patient performed sit to supine with supervision. Provided verbal cues for log roll technique to maintain spinal precautions. Doffed TLSO independently sitting EOB. Transfers: Patient performed sit to/from stand x4 with supervision using RW. Patient able to teach back hand placement and reaching back to sit without rotation. Patient performed a simulated SUV height car transfer with supervision using RW. Provided min cues for safe technique to maintain spinal precautions.  Gait Training:  Patient ambulated >50 feet using RW with supervision as above.  Patient ambulated up/down a ramp and over 10 feet of mulch (unlevel surface)to simulate community ambulation over unlevel surfaces with CGA using RW. Provided cues for technique and use of AD. Patient ascended/descended 4x6" steps x2 using B rails x1 and L rail with B upper extremity support x1 with CGA. Performed step-to gait pattern leading with R while ascending and L while descending. Provided min cues for technique and sequencing. Educated and demonstrated safe guarding technique and patient's mother performed with patient without cues on second trial.  Wheelchair Mobility:  Patient propelled wheelchair >300 feet with mod I. Utilized breaks without cues throughout session using brake extenders, required total A for leg rests due to spinal precautions. Reviewed donning/doffing leg rests with patient's mother.   Patient in bed with her mother in the room at end of session with breaks locked, bed alarm set, and all needs within reach. Patient's mother unable to stay for OT session this afternoon, scheduled family education for OT on Thursday 10-11am.   Therapy Documentation Precautions:  Precautions Precautions: Cervical, Back Precaution Booklet  Issued: Yes (comment) (from acute) Required Braces or Orthoses: Cervical Brace, Spinal Brace Cervical Brace: Hard collar, At all times Spinal Brace: Thoracolumbosacral orthotic, Applied in sitting position Restrictions Weight Bearing Restrictions: No    Therapy/Group: Individual Therapy  Kolten Ryback L Welford Christmas PT, DPT  10/21/2021, 12:22 PM

## 2021-10-21 NOTE — Progress Notes (Signed)
Orthopedic Tech Progress Note Patient Details:  Martha Campbell 1975-12-27 [id number] Called in order to [facility] Patient ID: Martha Campbell, female   DOB: 1976-05-17, 45 y.o.   MRN: [id number]  Martha Campbell 10/21/2021, 2:09 PM
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
## 2021-10-21 NOTE — Progress Notes (Signed)
Occupational Therapy Session Note  Patient Details  Name: Martha Campbell MRN: [id number] Date of Birth: [birth year]  Today's Date: 10/21/2021 OT Individual Time: 1305-1400 OT Individual Time Calculation (min): 55 min    Short Term Goals: Week 1:  OT Short Term Goal 1 (Week 1): Pt will complete LB dressing with MIN A & LRA OT Short Term Goal 2 (Week 1): Pt will perform grooming standing with LRAD & min A OT Short Term Goal 3 (Week 1): Pt will transfer with LRAD to [facility] with CGA   Skilled Therapeutic Interventions/Progress Updates:    Pt received in bed with c/o pain in L hip and low back, agreeable to OT. Pt verbose throughout tx, limited mental flexibility, and required redirection to functional tasks OTS answered questions regarding d/c planning and spent the first part of session discussing bathing at mother's home. Pt verbalized understanding of showering with current precautions + use of AE for LB. Pt states that mother would be here for family ed Thursday. Pt required several breaks d/t pain and returned to supine to perform stretches with LLE, stating her leg felt like it "has shin splints and is cramping in my calf". Pt educated on donning TLSO EOB and requires min A. OTS demonstrated use of TTB features and pt used RW to ambulate to bathroom. Able to return demonstrate technique with overall min cuing. Pt with grimaces of pain throughout transfer. Pt educated on lateral leans to wash buttocks during shower. Pt reports she will use BSC at home. Pt requests to void, and is found with small void in brief - pt unaware until OT mentions. Pt voids additional urine on BSC, completes peri-hygine and donning underwear with supervision and returns to supine. Able to doff brace EOB without assist. Pt left at end of session in bed with exit alarm on, call light in reach and all needs met   Therapy Documentation Precautions:  Precautions Precautions: Cervical, Back Precaution Booklet Issued: Yes  (comment) (from acute) Required Braces or Orthoses: Cervical Brace, Spinal Brace Cervical Brace: Hard collar, At all times Spinal Brace: Thoracolumbosacral orthotic, Applied in sitting position Restrictions Weight Bearing Restrictions: No   Therapy/Group: Individual Therapy  [name] 10/21/2021, 7:28 AM
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
## 2021-10-22 ENCOUNTER — Inpatient Hospital Stay (HOSPITAL_COMMUNITY): Payer: Self-pay

## 2021-10-22 LAB — GLUCOSE, CAPILLARY: Glucose-Capillary: 121 mg/dL — ABNORMAL HIGH (ref 70–99)

## 2021-10-22 MED ORDER — OXYCODONE HCL 5 MG PO TABS
5.0000 mg | ORAL_TABLET | ORAL | Status: DC | PRN
  Administered 2021-10-22 – 2021-10-24 (×6): 5 mg via ORAL
  Filled 2021-10-22 (×6): qty 1

## 2021-10-22 MED ORDER — CEFAZOLIN IV (FOR PTA / DISCHARGE USE ONLY): 2.0000 g | Freq: Three times a day (TID) | INTRAVENOUS | 0 refills | Status: DC

## 2021-10-22 NOTE — Progress Notes (Signed)
Inpatient Rehabilitation Discharge Medication Review by a Pharmacist  A complete drug regimen review was completed for this patient to identify any potential clinically significant medication issues.  High Risk Drug Classes Is patient taking? Indication by Medication  Antipsychotic No   Anticoagulant No   Antibiotic Yes Cefazolin for endocarditis, ends 11/28  Opioid Yes Oxycodone for pain  Antiplatelet No   Hypoglycemics/insulin No   Vasoactive Medication No   Chemotherapy No   Other No      Type of Medication Issue Identified Description of Issue Recommendation(s)  Drug Interaction(s) (clinically significant)     Duplicate Therapy     Allergy     No Medication Administration End Date     Incorrect Dose     Additional Drug Therapy Needed     Significant med changes from prior encounter (inform family/care partners about these prior to discharge).    Other       Clinically significant medication issues were identified that warrant physician communication and completion of prescribed/recommended actions by midnight of the next day:  No  Pharmacist comments: No medications prior to admission, working on home antibiotic set up  Time spent performing this drug regimen review (minutes):  20 minutes   Tad Moore 10/22/2021 9:28 AM

## 2021-10-22 NOTE — Progress Notes (Signed)
[facility] PHYSICAL MEDICINE & REHABILITATION PROGRESS NOTE  Subjective/Complaints: Pt had a reasonable night. No new complaints. Left side is comparatively weak but showing some improvement  ROS: Patient denies fever, rash, sore throat, blurred vision, nausea, vomiting, diarrhea, cough, shortness of breath or chest pain, headache, or mood change.   Objective: Vital Signs: Blood pressure 96/75, pulse 70, temperature 98.2 F (36.8 C), temperature source Oral, resp. rate 17, height _0  (1.651 m), weight 57.7 kg, last menstrual period 08/04/2013, SpO2 96 %. No results found. Recent Labs    10/20/21 0324  WBC 5.1  HGB 11.2*  HCT 33.4*  PLT 341   Recent Labs    10/20/21 0324  NA 136  K 3.7  CL 105  CO2 25  GLUCOSE 93  BUN <5*  CREATININE 0.44  CALCIUM 9.1   No intake or output data in the 24 hours ending 10/22/21 0811    Pressure Injury 10/11/21 Buttocks Right;Left Deep Tissue Pressure Injury - Purple or maroon localized area of discolored intact skin or blood-filled blister due to damage of underlying soft tissue from pressure and/or shear. about 1x1 bilateral bottocks  (Active)  10/11/21 0735  Location: Buttocks  Location Orientation: Right;Left  Staging: Deep Tissue Pressure Injury - Purple or maroon localized area of discolored intact skin or blood-filled blister due to damage of underlying soft tissue from pressure and/or shear.  Wound Description (Comments): about 1x1 bilateral bottocks purple color  Present on Admission:      Pressure Injury 10/11/21 Buttocks Right;Left Stage 1 -  Intact skin with non-blanchable redness of a localized area usually over a bony prominence. redness 6x7 bilateral bottocks (Active)  10/11/21 0735  Location: Buttocks  Location Orientation: Right;Left  Staging: Stage 1 -  Intact skin with non-blanchable redness of a localized area usually over a bony prominence.  Wound Description (Comments): redness 6x7 bilateral bottocks  Present on  Admission:     Physical Exam: BP 96/75 (BP Location: Right Arm)   Pulse 70   Temp 98.2 F (36.8 C) (Oral)   Resp 17   Ht _1  (1.651 m)   Wt 57.7 kg   LMP 08/04/2013   SpO2 96%   BMI 21.17 kg/m   Constitutional: No distress . Vital signs reviewed. HEENT: NCAT, EOMI, oral membranes moist Neck: supple Cardiovascular: RRR without murmur. No JVD    Respiratory/Chest: CTA Bilaterally without wheezes or rales. Normal effort    GI/Abdomen: BS +, non-tender, non-distended Ext: no clubbing, cyanosis, or edema Psych: pleasant and cooperative  Skin: buttocks wounds   Neuro: Alert, oriented x 3 Motor: B/l UE: 5/5 proximal to distal RLE:4/5 proximal to distal LLE:HF, KE 4/5, ADF 2/5 with associated pain  Assessment/Plan: 1. Functional deficits which require 3+ hours per day of interdisciplinary therapy in a comprehensive inpatient rehab setting. Physiatrist is providing close team supervision and 24 hour management of active medical problems listed below. Physiatrist and rehab team continue to assess barriers to discharge/monitor patient progress toward functional and medical goals   Care Tool:  Bathing  Bathing activity did not occur: Refused Body parts bathed by patient: Right arm, Left arm, Chest, Abdomen, Right upper leg, Left upper leg, Right lower leg, Left lower leg, Face     Body parts n/a: Front perineal area, Buttocks (pt reported nursing performed peri-care prior to OT)   Bathing assist Assist Level: Moderate Assistance - Patient 50 - 74%     Upper Body Dressing/Undressing Upper body dressing Upper body dressing/undressing activity  did not occur (including orthotics): Refused What is the patient wearing?: Pull over shirt, Orthosis (TLSO & C-Collar) Orthosis activity level: Performed by helper  Upper body assist Assist Level: Minimal Assistance - Patient > 75%    Lower Body Dressing/Undressing Lower body dressing    Lower body dressing activity did not occur:  Refused What is the patient wearing?: Hospital gown only, Underwear/pull up     Lower body assist Assist for lower body dressing: Supervision/Verbal cueing     Toileting Toileting    Toileting assist Assist for toileting: Supervision/Verbal cueing     Transfers Chair/bed transfer  Transfers assist     Chair/bed transfer assist level: Supervision/Verbal cueing Chair/bed transfer assistive device: Programmer, multimedia   Ambulation assist      Assist level: Minimal Assistance - Patient > 75% Assistive device: Walker-rolling Max distance: 33 ft   Walk 10 feet activity   Assist     Assist level: Minimal Assistance - Patient > 75% Assistive device: Walker-rolling   Walk 50 feet activity   Assist Walk 50 feet with 2 turns activity did not occur: Safety/medical concerns (decreased pain/activity tolerance)         Walk 150 feet activity   Assist Walk 150 feet activity did not occur: Safety/medical concerns         Walk 10 feet on uneven surface  activity   Assist Walk 10 feet on uneven surfaces activity did not occur: Safety/medical concerns         Wheelchair     Assist Is the patient using a wheelchair?: Yes Type of Wheelchair: Manual    Wheelchair assist level: Set up assist Max wheelchair distance: >300 ft    Wheelchair 50 feet with 2 turns activity    Assist        Assist Level: Supervision/Verbal cueing   Wheelchair 150 feet activity     Assist      Assist Level: Supervision/Verbal cueing    Medical Problem List and Plan: 1.  Deficits with mobility, transfers, self-care secondary to polytrauma.THoracolumbar epidural hematoma  -Continue CIR therapies including PT, OT   2.  Antithrombotics: -DVT/anticoagulation:  Pharmaceutical: Lovenox added 10/22             -antiplatelet therapy:  3. Pain Management: PRN meds  Will be cautious with medications due to history of drug abuse  Patient will require  significant encouragement and reassurance  Relatively controlled on 10/27 11/1 robaxin 1000 mg TID- pain much better per pt.  11/2--discussed with pt. Decrease oxy to $Remo'5mg'cAxQC$  Y1OFB 4. Mood: Celexa started on 10/26             -antipsychotic agents: N/A as delirium tremens has resolved.  5. Neuropsych: This patient is capable of making decisions on her own behalf. 6. Skin/Wound Care: Routine pressure relief measures.  7. Fluids/Electrolytes/Nutrition: Monitor I/Os 8. MSSA bacteremia on IV cefazolin for 6 weeks--end date 11/17/21 as endocarditis not ruled out.   -SW working on getting assistance with abx covg at home 9. Fatty liver: Abnormal LFTs resolved except for elevated A phos.    Alk phos improving on 10/26 10. Acute on chronic hyponatremia: Continue to monitor Na with serial checks.              Sodium 136 on 10/31  Continue to monitor 11. Thrombocytosis: Likely reactive.  Platelets 544 on 10/26  10/31- Plts down to 341.  12. Hypoalbuminemia: Albumin level- 2.4.  13. Lytic lesions left fibula. Have  reviewed films. Spoke with ortho. This would be an odd presentation for MM given isolated location.   -MRI  -SPEP, UPEP 14.  C7 fracture: Aspen collar on at all times.  15. T1, T2  and T11 deformities/fractures/hematoma: TLSO when OOB 16. ABLA: Monitor H/H with serial checks. Hgb 11-12 range. WBC stable.              Hemoglobin 11.1 on 10/26  10/3`- Hb 11.2   17.  Urinary frequency  UA unremarkable, urine culture neg 18.  Left foot drop  PRAFO nightly  ?AFO 19.  Vitamin D deficiency  Supplement started on 10/27 20. Constipation  10/30-  bm, con't regimen  LOS: 8 days A FACE TO FACE EVALUATION WAS PERFORMED  Meredith Staggers 10/22/2021, 8:11 AM

## 2021-10-22 NOTE — Progress Notes (Signed)
Physical Therapy Discharge Summary  Patient Details  Name: Martha Campbell MRN: [id number] Date of Birth: [birth year]  Today's Date: 10/23/2021 PT Individual Time: 0800-0850 and 1305-1330 PT Individual Time Calculation (min): 50 min and 25 min    Patient has met 11 of 11 long term goals due to improved activity tolerance, improved balance, improved postural control, increased strength, increased range of motion, decreased pain, and ability to compensate for deficits.  Patient to discharge at an ambulatory level Modified Independent.   Patient's care partner is independent to provide the necessary physical assistance at discharge.  Reasons goals not met: n/a  Recommendation:  Patient will benefit from ongoing skilled PT services in home health setting to continue to advance safe functional mobility, address ongoing impairments in balance, strength, ROM when able, activity tolerance, functional mobility, patient/caregiver education, and minimize fall risk.  Equipment: Recommending RW and 16"x16" light weight w/c, patient's family has obtained these items  Reasons for discharge: treatment goals met  Patient/family agrees with progress made and goals achieved: Yes  Skilled Therapeutic Intervention: Session 1: Patient in bed with c-collar donned upon PT arrival. Patient alert and agreeable to PT session. Patient reported 3/10 L hip pain during session, RN made aware. PT provided repositioning, rest breaks, and distraction as pain interventions throughout session.   Focused session on implementing and reviewing HEP handout provided for d/c.   Therapeutic Activity: Bed Mobility: Patient performed supine to/from sit with mod I for increased time in a flat bed without use of bed rail. Patient donned TLSO sitting EOB with set-up assist. Donned B tennis shoes with L Foot-up brace with max A for energy/time management.  Transfers: Patient performed sit to/from stand x4 with mod I using RW. Patient    Therapeutic Exercise: Patient performed the following exercises with verbal and tactile cues for proper technique. Introduced HEP for [employer] exercises to address strength and balance. Pt performed 10 reps x 2 sets for BLE including LAQ with 5 second hold, standing hip abduction, standing hamstring curls, mini squats, standing heel/toe raises, tandem stance with single upper extremity support x10 sec R/L, and sit to stands. Added supine piriformis stretch 2x30 sec to HEP for hip pain management.   Patient on [facility] for toileting at end of session and all needs within reach. Patient missed 10 min of skilled PT due to toileting, RN made aware. Will attempt to make-up missed time as able.    Session 2: Patient in bed upon PT arrival. Patient alert and agreeable to PT session. Patient denied pain during session.  Focused session on making patient independent with toileting due to frequency and discomfort with prolonged sitting on BSC. Reports she returned to bed with her RW independently earlier without nursing. Patient sat EOB, donned TLSO placed on RW at bedside, performed stand pivot to/from [facility], performed lower body clothing management, simulated peri-care, doffed TLSO and returned to lying with mod I. Maintained BSC next to [facility], RW within reach with TLSO placed on RW and L bed rail down to allow for patient to be independent with toileting for remainder of patient's stay. Provided wash cloths for peri-care and nursing staff locating hygiene wipes or toilet paper for patient at this time. Safety plan updated and nursing staff aware that patient is independent with toileting in the room and about room set-up. Patient educated wearing non-skid socks at all times when transferring, on calling for assist if items are out of reach or she feels unsafe at any time, patient in  agreement.   Patient not cleared for independent ambulation in the room at this time. Patient and staff in agreement.   Patient in bed at  end of session with breaks locked, bed alarm set off to allow transfers, and all needs within reach.    PT Discharge Precautions/Restrictions Precautions Precautions: Cervical;Back Precaution Booklet Issued: Yes (comment) Required Braces or Orthoses: Cervical Brace;Spinal Brace Cervical Brace: Hard collar;At all times Spinal Brace: Thoracolumbosacral orthotic;Applied in sitting position Restrictions Weight Bearing Restrictions: No Pain Interference Pain Interference Pain Effect on Sleep: 2. Occasionally Pain Interference with Therapy Activities: 2. Occasionally Pain Interference with Day-to-Day Activities: 2. Occasionally Vision/Perception  Vision - History Ability to See in Adequate Light: 0 Adequate Vision - Assessment Eye Alignment: Within Functional Limits Ocular Range of Motion: Within Functional Limits Alignment/Gaze Preference: Within Defined Limits Tracking/Visual Pursuits: Able to track stimulus in all quads without difficulty Convergence: Within functional limits Perception Perception: Within Functional Limits Praxis Praxis: Intact  Cognition Overall Cognitive Status: History of cognitive impairments - at baseline (STM deficits since seizure in 2019) Arousal/Alertness: Awake/alert Orientation Level: Oriented X4 Focused Attention: Appears intact Memory: Impaired Memory Impairment: Retrieval deficit;Decreased short term memory;Decreased recall of new information Decreased Short Term Memory: Verbal basic;Functional basic Awareness: Appears intact Self Correcting: Appears intact Safety/Judgment: Appears intact Comments: Patient with anxiety at baseline, exaserbated by events surrounding her injuries Sensation Sensation Light Touch: Impaired Detail Peripheral sensation comments: decreased sensation over peroneal nerve distribution Light Touch Impaired Details: Impaired LLE Proprioception: Appears Intact Coordination Gross Motor Movements are Fluid and  Coordinated: No Fine Motor Movements are Fluid and Coordinated: Yes Heel Shin Test: [facility] Motor  Motor Motor: Abnormal postural alignment and control Motor - Skilled Clinical Observations: chronic L hip pain, gen. weakness  Mobility Bed Mobility Bed Mobility: Rolling Left;Supine to Sit;Sit to Supine;Rolling Right Rolling Right: Independent with assistive device Rolling Left: Independent with assistive device Supine to Sit: Independent with assistive device Sit to Supine: Independent with assistive device Transfers Transfers: Sit to Stand;Stand to Sit;Stand Pivot Transfers Sit to Stand: Independent with assistive device Stand to Sit: Independent with assistive device Stand Pivot Transfers: Independent with assistive device Transfer (Assistive device): Rolling walker Locomotion  Gait Ambulation: Yes Gait Assistance: Supervision/Verbal cueing Gait Distance (Feet): 150 Feet Assistive device: Rolling walker Gait Gait: Yes Gait Pattern: Decreased dorsiflexion - left;Step-through pattern;Lateral hip instability;Trunk flexed Gait velocity: decreased Stairs / Additional Locomotion Stairs: Yes Stairs Assistance: Contact Guard/Touching assist Stair Management Technique: One rail Left Number of Stairs: 8 Height of Stairs: 6 Ramp: Nurse, mental health Mobility: Yes Wheelchair Assistance: Independent with Camera operator: Both upper extremities Wheelchair Parts Management: Needs assistance Distance: >300 ft  Trunk/Postural Assessment  Cervical Assessment Cervical Assessment: Exceptions to [facility] (c-collar) Thoracic Assessment Thoracic Assessment: Exceptions to WFL (TLSO) Lumbar Assessment Lumbar Assessment: Exceptions to WFL (TLSO) Postural Control Postural Control: Deficits on evaluation (decreased/delayed)  Balance Balance Balance Assessed: Yes Static Sitting Balance Static Sitting - Balance Support: Feet  supported;No upper extremity supported Static Sitting - Level of Assistance: 7: Independent Dynamic Sitting Balance Dynamic Sitting - Balance Support: Feet supported;No upper extremity supported Dynamic Sitting - Level of Assistance: 6: Modified independent (Device/Increase time) Dynamic Sitting - Balance Activities: Reaching for objects Sitting balance - Comments:  (EOB sitting for bathing/dressing) Static Standing Balance Static Standing - Balance Support: During functional activity;Right upper extremity supported;Left upper extremity supported Static Standing - Level of Assistance: 6: Modified independent (Device/Increase time) Dynamic Standing Balance Dynamic Standing -  Balance Support: Right upper extremity supported;Left upper extremity supported;During functional activity Dynamic Standing - Level of Assistance: 6: Modified independent (Device/Increase time) Extremity Assessment  RLE Assessment RLE Assessment: Within Functional Limits Active Range of Motion (AROM) Comments: WFL with all functional mobility General Strength Comments: Grossly 4+/5 throughout, DF 3+/5 LLE Assessment LLE Assessment: Exceptions to [facility] Active Range of Motion (AROM) Comments: limited to no DF AROM General Strength Comments: Grossly 4+/5 throughout except DF 1/5    Destiney Sanabia L Ebelin Dillehay PT, DPT  10/23/2021, 5:10 PM

## 2021-10-22 NOTE — Progress Notes (Signed)
Occupational Therapy Session Note  Patient Details  Name: PENNI PENADO MRN: [id number] Date of Birth: Apr 15, 1976  Today's Date: 10/22/2021 OT Individual Time: 4825-0037 OT Individual Time Calculation (min): 60 min  Today's Date: 10/22/2021 OT Individual Time: 1015-1056 OT Individual Time Calculation (min): 41 min    Short Term Goals: Week 1:  OT Short Term Goal 1 (Week 1): Pt will complete LB dressing with MIN A & LRA OT Short Term Goal 2 (Week 1): Pt will perform grooming standing with LRAD & min A OT Short Term Goal 3 (Week 1): Pt will transfer with LRAD to [facility] with CGA  Skilled Therapeutic Interventions/Progress Updates:    Pt received in bed, LPN present for medications, saying she has been up since 2:30. Pt declines ADLs at this time - plan to shower next OT session. Pt with minor confusion between staff members and details, thought OTS was providing medication through PICC line.Pt came to EOB without use of bedrails to practice for home setup, and was able to direct care for donning TLSO. Pt completed functional mobility using RW and CGA down to therapy gym on [address] with 1 seated rest break in wc. Pt mentioned her L foot hasn't been dragging as much.   Therapeutic activity Pt completes NuStep for activity tolerance and UE strengthening for functional transfers. Pt completes 5 mins at 3 resistance with 10 second rest break before OTS grades activity to 5 resistance. Pt completed 2 mins with good tolerance for activity, resistance upgraded to 10. Pt transported back to room in wc for time mgmt. Pt requests to stay upright in wc. Pt left at end of session in wc, with exit pad alarm on, call light in reach and all needs met.  Session II: Pt received in c/o 7/10 pain in L hip. OTS provides rest & repositioning throughout tx. Pt declines shower as previously planned, and instead requests to perform BADLs at sink level.   ADL: Skilled interventions include: Pt comes to EOB with bed flat and no  bedrails. Pt with increased pain, reports seeing spots and states that she might pass out. OT facilitates deep breathing and cuing for overall relaxation. Pt reports feeling much better after short rest break, and says she overdid it in PT earlier. Pt then requests to use BSC, stating that she feels constipated and has not had BM since Monday. LPN made aware. Pt dons TLSO with overall MIN A from OTS to reach strap, but able to direct donning of brace. EOB > BSC with RW with close (S). Pt has small void of urine, and requires increased time to attempt BM without success. Pt requests water as she feels she is dehydrated - OTS provides water with LPN entering room. Pt returns to bed, directs care for bed positioning/PRAFO boot and is able to remove TLSO with supervision. Pt left at end of session in bed with exit alarm on, call light in reach and all needs met    Therapy Documentation Precautions:  Precautions Precautions: Cervical, Back Precaution Booklet Issued: Yes (comment) (from acute) Required Braces or Orthoses: Cervical Brace, Spinal Brace Cervical Brace: Hard collar, At all times Spinal Brace: Thoracolumbosacral orthotic, Applied in sitting position Restrictions Weight Bearing Restrictions: No General:   Therapy/Group: Individual Therapy  Miyeko Mahlum 10/22/2021, 7:08 AM

## 2021-10-22 NOTE — Progress Notes (Signed)
PHARMACY CONSULT NOTE FOR:  OUTPATIENT  PARENTERAL ANTIBIOTIC THERAPY (OPAT)  Indication: MSSA Bacteremia Regimen:  Cefazolin 2 gm IV Q 8 hrs End date:  11/17/21  IV antibiotic discharge orders are pended. To discharging provider:  please sign these orders via discharge navigator,  Select New Orders & click on the button choice - Manage This Unsigned Work.    Thank you for allowing pharmacy to be a part of this patient's care.  Gillermina Hu, PharmD, BCPS, [facility] Clinical Pharmacist 10/22/2021, 5:07 PM

## 2021-10-22 NOTE — Progress Notes (Signed)
Physical Therapy Session Note  Patient Details  Name: Martha Campbell MRN: [id number] Date of Birth: 1976/01/03  Today's Date: 10/22/2021 PT Individual Time: 0915-1010 and  1320-1405 PT Individual Time Calculation (min): 55 min and 45 min   Short Term Goals: Week 1:  PT Short Term Goal 1 (Week 1): Patient will perform bed mobility with min A in a flat bed without use of bed rails with improved pain tolerance by >2 points on 10 point pain scale (8/10 with rolling on eval). PT Short Term Goal 2 (Week 1): Patient will perform basic transfers with CGA consistently from various household surfaces using LRAD. PT Short Term Goal 3 (Week 1): Patient with ambulate >100 feet with CGA using LRAD. PT Short Term Goal 4 (Week 1): Patient will tolerate sitting OOB >30 min between therapy sessions 2/5 days this week.  Skilled Therapeutic Interventions/Progress Updates:     Session 1: Patient on [facility] in the room with c-collar and TLSO donned upon PT arrival. Patient alert and agreeable to PT session. Patient reported 4/10 L hip pain during session, RN made aware. PT provided repositioning, rest breaks, and distraction as pain interventions throughout session.   Therapeutic Activity: Bed Mobility: Patient performed sit to supine with mod I in a flat bed without use of bed rail. Doffed TLSO sitting EOB with mod A due to increased pain following ambulation.  Transfers: Patient performed sit to/from stand from [facility], w/c, and bed with supervision-mod I using RW. Provided verbal cues for reaching back to sit x1. Patient required total A for peri-care and min A for incontinence brief management, patient reports that she is continent, however, prefers to wear a brief rather than mesh panties.   Donned B tennis shoes, laced L foot-up brace into shoe and educated on lacing, donning, and doffing technique all with total A due to back precautions.   Gait Training:  Patient ambulated 116 feet using RW with supervision.  Ambulated with improved L foot-clearance and heel strike at initial contact reducing fall risk with gait with Foot-up brace donned. Provided verbal cues for erect posture and looking ahead.  Provided soft tissue mobilization and trigger point release and manual piriformis stretch to L gluteal muscles for reduced L hip pain following mobility. Patient reported improved to 2/10 after.   Patient in bed due to fatigue from sitting up >1 prior to session at end of session with breaks locked, bed alarm set, and all needs within reach.   Session 2: Patient in bed with c-collar donned upon PT arrival. Patient alert and agreeable to PT session. Patient reported 2-5/10 L hip pain during session, RN made aware. PT provided repositioning, rest breaks, and distraction as pain interventions throughout session.   Focused initial part of session on d/c assessment of strength, vision, sensation, and discussed improvement in functional mobility, see d/c note for details.   Therapeutic Activity: Bed Mobility: Patient performed supine to/from sit with mod I in a flat bed without use of bed rails, demonstrating log roll technique. Patient donned TLSO with min A for placing brace behind her back.  Transfers: Patient performed stand pivot to [facility] with mod I using RW, she was continent of bladder during toileting and performed peri-care wit set-up assist and brief management with min A for tightening strap. She performed sit to/from stand x1 with mod I using RW.   Gait Training:  Patient ambulated 150 feet using RW with supervision without brace this session. Ambulated with decreased L DF, noted activation  through partial range with functional gait, but continues to limit foot clearance for safe ambulation over unlevel surfaces or with fatigue for long distances. Recommend use of Foot-up brace with tennis shoes for ambulation >15 feet inside and outside the home to prevent falls. Patient stated understanding.   Patient in  bed, doffed TLSO independently sitting EOB, at end of session with breaks locked, bed alarm set, and all needs within reach.   Therapy Documentation Precautions:  Precautions Precautions: Cervical, Back Precaution Booklet Issued: Yes (comment) (from acute) Required Braces or Orthoses: Cervical Brace, Spinal Brace Cervical Brace: Hard collar, At all times Spinal Brace: Thoracolumbosacral orthotic, Applied in sitting position Restrictions Weight Bearing Restrictions: No    Therapy/Group: Individual Therapy  Lyndon Chenoweth L Teniyah Seivert PT, DPT  10/22/2021, 4:04 PM

## 2021-10-22 NOTE — Progress Notes (Signed)
Patient ID: Martha Campbell, female   DOB: [birth year], 45 y.o.   MRN: [id number]  10/21/2021- SW met with pt in room to provide updates from team conference, and d/c date 11/4. Pt reports that her mother has access to all DME needed, and will confirm if she has the TTB. SW informed on exploring [facility] if able to accept LOG for [facility]. SW explained she may not get continued therapies.   SW made contact with Pam/[facility] about Iv Abx. Reported she met with pt last night, and   SW sent HHSN/PT/OT referral to Amy/[facility] and waiting on follow-up. *SW received updates referral declined as unable to staff this week.   10/22/2021 SW sent HHPT/OT/SN referral to Cory/[facility] and waiting on follow-up.   Loralee Pacas, MSW, [place] Office: [phone] Cell: [phone] Fax: [phone]

## 2021-10-23 LAB — GLUCOSE, CAPILLARY: Glucose-Capillary: 90 mg/dL (ref 70–99)

## 2021-10-23 MED ORDER — CALCIUM CARBONATE 1250 (500 CA) MG PO TABS
1.0000 | ORAL_TABLET | Freq: Two times a day (BID) | ORAL | Status: DC
  Administered 2021-10-23 – 2021-10-24 (×3): 500 mg via ORAL
  Filled 2021-10-23 (×4): qty 1

## 2021-10-23 NOTE — Progress Notes (Signed)
[place] PHYSICAL MEDICINE & REHABILITATION PROGRESS NOTE  Subjective/Complaints: Pt up in chair. Mom here for family ed. Had some issues getting TLSO on tight enough today  ROS: Patient denies fever, rash, sore throat, blurred vision, nausea, vomiting, diarrhea, cough, shortness of breath or chest pain, headache, or mood change.   Objective: Vital Signs: Blood pressure 95/70, pulse 76, temperature 98 F (36.7 C), resp. rate 17, height $RemoveBe'5\' 5"'YxTyyRGzF$  (1.651 m), weight 57.7 kg, last menstrual period 08/04/2013, SpO2 96 %. MR TIBIA FIBULA LEFT WO CONTRAST  Result Date: 10/22/2021 CLINICAL DATA:  Left lower leg pain. Lytic lesions seen in the fibula on x-ray. EXAM: MRI OF LOWER LEFT EXTREMITY WITHOUT CONTRAST TECHNIQUE: Multiplanar, multisequence MR imaging of the left lower leg was performed. No intravenous contrast was administered. COMPARISON:  Left tibia and fibula x-rays dated September 29, 2021. FINDINGS: Bones/Joint/Cartilage No focal bone lesion. There are few areas of increased wispy T2 hyperintense signal in the cortex of the proximal fibula and tibia with corresponding T1 hyperintense signal, likely reflecting osteoporosis. No fracture or dislocation. Mild degenerative changes of both knees. No joint effusion. Muscles and Tendons Intact. Soft tissue No fluid collection or hematoma.  No soft tissue mass. IMPRESSION: 1. No focal bone lesion. 2. Cortical signal changes in the proximal tibia and fibula corresponding to the lucencies seen on x-ray. These lucencies are also present in the right lower leg and likely reflect underlying osteoporosis. Electronically Signed   By: Titus Dubin M.D.   On: 10/22/2021 16:17   No results for input(s): WBC, HGB, HCT, PLT in the last 72 hours.  No results for input(s): NA, K, CL, CO2, GLUCOSE, BUN, CREATININE, CALCIUM in the last 72 hours.   Intake/Output Summary (Last 24 hours) at 10/23/2021 1049 Last data filed at 10/22/2021 2033 Gross per 24 hour  Intake  1914.36 ml  Output 200 ml  Net 1714.36 ml      Pressure Injury 10/11/21 Buttocks Right;Left Deep Tissue Pressure Injury - Purple or maroon localized area of discolored intact skin or blood-filled blister due to damage of underlying soft tissue from pressure and/or shear. about 1x1 bilateral bottocks  (Active)  10/11/21 0735  Location: Buttocks  Location Orientation: Right;Left  Staging: Deep Tissue Pressure Injury - Purple or maroon localized area of discolored intact skin or blood-filled blister due to damage of underlying soft tissue from pressure and/or shear.  Wound Description (Comments): about 1x1 bilateral bottocks purple color  Present on Admission:      Pressure Injury 10/11/21 Buttocks Right;Left Stage 1 -  Intact skin with non-blanchable redness of a localized area usually over a bony prominence. redness 6x7 bilateral bottocks (Active)  10/11/21 0735  Location: Buttocks  Location Orientation: Right;Left  Staging: Stage 1 -  Intact skin with non-blanchable redness of a localized area usually over a bony prominence.  Wound Description (Comments): redness 6x7 bilateral bottocks  Present on Admission:     Physical Exam: BP 95/70 (BP Location: Left Arm)   Pulse 76   Temp 98 F (36.7 C)   Resp 17   Ht $R'5\' 5"'Vk$  (1.651 m)   Wt 57.7 kg   LMP 08/04/2013   SpO2 96%   BMI 21.17 kg/m   Constitutional: No distress . Vital signs reviewed. HEENT: NCAT, EOMI, oral membranes moist Neck: supple Cardiovascular: RRR without murmur. No JVD    Respiratory/Chest: CTA Bilaterally without wheezes or rales. Normal effort    GI/Abdomen: BS +, non-tender, non-distended Ext: no clubbing, cyanosis, or edema  Psych: pleasant and cooperative   Skin: buttocks wounds   Neuro: Alert, oriented x 3 Motor: B/l UE: 5/5 proximal to distal RLE:4/5 proximal to distal LLE:HF, KE 4/5, ADF 2/5 with associated pain still present Musc: wearing TLSO and c-collar  Assessment/Plan: 1. Functional deficits  which require 3+ hours per day of interdisciplinary therapy in a comprehensive inpatient rehab setting. Physiatrist is providing close team supervision and 24 hour management of active medical problems listed below. Physiatrist and rehab team continue to assess barriers to discharge/monitor patient progress toward functional and medical goals   Care Tool:  Bathing  Bathing activity did not occur: Refused Body parts bathed by patient: Right arm, Left arm, Chest, Abdomen, Right upper leg, Left upper leg, Right lower leg, Left lower leg, Face     Body parts n/a: Front perineal area, Buttocks (pt reported nursing performed peri-care prior to OT)   Bathing assist Assist Level: Moderate Assistance - Patient 50 - 74%     Upper Body Dressing/Undressing Upper body dressing Upper body dressing/undressing activity did not occur (including orthotics): Refused What is the patient wearing?: Orthosis Orthosis activity level: Performed by patient  Upper body assist Assist Level: Minimal Assistance - Patient > 75%    Lower Body Dressing/Undressing Lower body dressing    Lower body dressing activity did not occur: Refused What is the patient wearing?: Hospital gown only, Underwear/pull up     Lower body assist Assist for lower body dressing: Supervision/Verbal cueing     Toileting Toileting    Toileting assist Assist for toileting: Independent with assistive device Assistive Device Comment: RW   Transfers Chair/bed transfer  Transfers assist     Chair/bed transfer assist level: Supervision/Verbal cueing Chair/bed transfer assistive device: Programmer, multimedia   Ambulation assist      Assist level: Minimal Assistance - Patient > 75% Assistive device: Walker-rolling Max distance: 33 ft   Walk 10 feet activity   Assist     Assist level: Minimal Assistance - Patient > 75% Assistive device: Walker-rolling   Walk 50 feet activity   Assist Walk 50 feet with 2  turns activity did not occur: Safety/medical concerns (decreased pain/activity tolerance)         Walk 150 feet activity   Assist Walk 150 feet activity did not occur: Safety/medical concerns         Walk 10 feet on uneven surface  activity   Assist Walk 10 feet on uneven surfaces activity did not occur: Safety/medical concerns         Wheelchair     Assist Is the patient using a wheelchair?: Yes Type of Wheelchair: Manual    Wheelchair assist level: Set up assist Max wheelchair distance: >300 ft    Wheelchair 50 feet with 2 turns activity    Assist        Assist Level: Supervision/Verbal cueing   Wheelchair 150 feet activity     Assist      Assist Level: Supervision/Verbal cueing    Medical Problem List and Plan: 1.  Deficits with mobility, transfers, self-care secondary to polytrauma.THoracolumbar epidural hematoma  -Continue CIR therapies including PT, OT  -ELOS 11/4  2.  Antithrombotics: -DVT/anticoagulation:  Pharmaceutical: Lovenox added 10/22             -antiplatelet therapy:  3. Pain Management: PRN meds  Will be cautious with medications due to history of drug abuse  Patient will require significant encouragement and reassurance  Relatively controlled on 10/27 11/1  robaxin 1000 mg TID- pain much better per pt.  11/2- Decreased oxy to $Remo'5mg'lQKae$  q4prn 4. Mood: Celexa started on 10/26             -antipsychotic agents: N/A as delirium tremens has resolved.  5. Neuropsych: This patient is capable of making decisions on her own behalf. 6. Skin/Wound Care: Routine pressure relief measures.  7. Fluids/Electrolytes/Nutrition: Monitor I/Os 8. MSSA bacteremia on IV cefazolin for 6 weeks--end date 11/17/21 as endocarditis not ruled out.   -SW working on getting assistance with abx covg at home 9. Fatty liver: Abnormal LFTs resolved except for elevated A phos.    Alk phos improving on 10/26 10. Acute on chronic hyponatremia: Continue to  monitor Na with serial checks.              Sodium 136 on 10/31  Continue to monitor 11. Thrombocytosis: Likely reactive.  Platelets 544 on 10/26  10/31- Plts down to 341.  12. Hypoalbuminemia: Albumin level- 2.4.  13. Lytic lesions left fibula. Have reviewed films. Spoke with ortho. This would be an odd presentation for MM given isolated location.   -MRI -demonstrates ?lucencies, likely are osteoporotic  -SPEP, UPEP pending  -calcium/vitamin d, f/u with primary 14.  C7 fracture: Aspen collar on at all times.  15. T1, T2  and T11 deformities/fractures/hematoma: TLSO when OOB 16. ABLA: Monitor H/H with serial checks. Hgb 11-12 range. WBC stable.              Hemoglobin 11.1 on 10/26  10/3`- Hb 11.2   17.  Urinary frequency  UA unremarkable, urine culture neg 18.  Left foot drop  PRAFO nightly  Foot up AFO 19.  Vitamin D deficiency  Supplement started on 10/27 20. Constipation  10/30-  bm, con't regimen  LOS: 9 days A FACE TO FACE EVALUATION WAS PERFORMED  Meredith Staggers 10/23/2021, 10:49 AM

## 2021-10-23 NOTE — Progress Notes (Signed)
Physical Therapy Session Note  Patient Details  Name: Martha Campbell MRN: [id number] Date of Birth: 1976/01/02  Today's Date: 10/23/2021 PT Individual Time: 4825-0037 PT Individual Time Calculation (min): 40 min   Short Term Goals: Week 1:  PT Short Term Goal 1 (Week 1): Patient will perform bed mobility with min A in a flat bed without use of bed rails with improved pain tolerance by >2 points on 10 point pain scale (8/10 with rolling on eval). PT Short Term Goal 2 (Week 1): Patient will perform basic transfers with CGA consistently from various household surfaces using LRAD. PT Short Term Goal 3 (Week 1): Patient with ambulate >100 feet with CGA using LRAD. PT Short Term Goal 4 (Week 1): Patient will tolerate sitting OOB >30 min between therapy sessions 2/5 days this week.  Skilled Therapeutic Interventions/Progress Updates:   Pt received sitting in WC and agreeable to PT. Pt transported to rehab gym in [facility]. Ambulatory transfer to couch with RW and supervision assist. Pt performed sit<>stand transfers from couch height with min assist to stabilize RW x 2 and then with supervision assist x 2, cues for UE supported on arm rest and cues for safety, but pt noted to prefer to use BUE on RW, regardless of instruction.   WC mobility through hall x 158ft with cues for direction only. Ambulatory transfer to EOB with RW and supervision assist from PT for safety. Then pot requests to returned to [facility] Performed stand pivot transfer to [facility] with RW and supervision assist., noted to pull on RW to attain standing position, but no LOB noted.  Pt left sitting in WC with call bell in reach and all needs met.       Therapy Documentation Precautions:  Precautions Precautions: Cervical, Back Precaution Booklet Issued: Yes (comment) Required Braces or Orthoses: Cervical Brace, Spinal Brace Cervical Brace: Hard collar, At all times Spinal Brace: Thoracolumbosacral orthotic, Applied in sitting  position Restrictions Weight Bearing Restrictions: No  Pain: Pain Assessment Pain Scale: 0-10 Pain Score: 4  Pain Type: Acute pain Pain Location: Back Pain Orientation: Mid;Lower Pain Descriptors / Indicators: Aching;Constant Pain Frequency: Constant Pain Onset: On-going Patients Stated Pain Goal: 2 Pain Intervention(s): Medication (See eMAR);Repositioned Multiple Pain Sites: No     Therapy/Group: Individual Therapy  Lorie Phenix 10/23/2021, 9:59 AM

## 2021-10-23 NOTE — Progress Notes (Signed)
Occupational Therapy Discharge Summary  Patient Details  Name: Martha Campbell MRN: [id number] Date of Birth: August 24, 1976  Today's Date: 10/23/2021 OT Individual Time: 1000-1101 OT Individual Time Calculation (min): 61 min    Skilled Therapeutic Intervention/Family Education: Pt received in wc with chronic pain 4/10 in back reported throughout tx session. Rest and repositioning offered as pain interventions. Pt's mother present for family education. Session today focused on d/c planning, educating pt and mother on strategies for BADL performance and simulation of bathing/dressing activities at shower level. Pt able to recall 3/3 back precautions + log roll technique for bed mobility, but with poor functional adherence to precautions, requiring skilled intervention to remind pt of strategies to use instead (e.g. figure 4 seated). Pt and mother received extensive education on step-by-step process of performing functional mobility to shower, transitioning to TTB and removal of TLSO once seated while c-collar remaining on at all times. Pt educated on lateral leans to wash buttocks, and verbalized understanding of putting TLSO back on to exit shower. Pt required several rest breaks during session d/t pain, and continues to be self-limiting in performance of BADLs. Pt and mother return demonstrated safe TTB transfer using RW, with donning TLSO. Pt able to perform simulated bathing/dressing with overall mod I, using LH sponge to wash LB and back seated on TTB. Pt requires min instructional cuing to don pants safely as she attempts to do so in standing to fasten buttons. Pt donned socks and footwear mod I EOB. OTS educates on safe removal of c-collar in supine with bed flat to change pads for cleaning. Mother required min cuing to fasten pads securely, and to slide back part of collar under pt's neck. Pt and mother provided with written step-by-step instructions for showering with adherence to back and cervical  precautions. Pt slightly verbose at times, and needed redirection to stay on task. Pt left at end of session in bed with exit alarm on, call light in reach and all needs met.   Discharge Summary: Pt has improved during her time at [facility] from initial MIN-MOD A at eval to discharging at overall MOD I level. Pt has progressed from using the bedpan to [facility] for toileting needs. Pt completes functional mobility with a RW, and continues to wear TLSO OOB with C-Collar on at all times. Pt continued to be self-limiting during her stay, with frequent c/o pain & fatigue in therapy sessions. Pt with anxiety and apprehension to pain along with perseverative tendencies about specific details. Note that pt declined many of the opportunities offered to perform BADLs, and could not complete therapy sessions d/t pain/fatigue. Pt improved with increased rapport with therapy team and empathetic listening provided.    Patient has met 9 of 9 long term goals due to improved activity tolerance, improved balance, postural control, and ability to compensate for deficits.  Patient to discharge at overall Modified Independent level.  Patient's care partner is independent to provide the necessary cognitive assistance at discharge.    Reasons goals not met: N/A  Recommendation:  Patient will benefit from ongoing skilled OT services in home health setting to continue to advance functional skills in the area of BADL, iADL, and Reduce care partner burden.  Equipment: TTB  Reasons for discharge: treatment goals met and discharge from hospital  Patient/family agrees with progress made and goals achieved: Yes  OT Discharge Precautions/Restrictions  Precautions Precautions: Cervical;Back Precaution Booklet Issued: Yes (comment) Precaution Comments: reviewed TLSO brace application, proper cervical collar fit (mother also able  to don/doff TLSO & cervical collar. pt able to direct care.) Required Braces or Orthoses: Cervical  Brace;Spinal Brace Cervical Brace: Hard collar;At all times Spinal Brace: Thoracolumbosacral orthotic;Applied in sitting position (pt & mother recieved education (written & verbal) on TLSO for showering) Restrictions Weight Bearing Restrictions: No Pain Pain Assessment Pain Scale: 0-10 Pain Score: 4  ADL ADL Eating: Independent Where Assessed-Eating: Bed level Grooming: Modified independent Where Assessed-Grooming: Edge of bed Upper Body Bathing: Modified independent (provided with strategies and TLSO donning/doffing while in shower) Where Assessed-Upper Body Bathing: Shower (simulated at d/c with LH sponge on TTB) Lower Body Bathing: Modified independent (LH sponge, with additional reminders for adherence to precautions during functional movements) Where Assessed-Lower Body Bathing: Shower Upper Body Dressing: Modified independent (Device) Where Assessed-Upper Body Dressing: Edge of bed Lower Body Dressing: Modified independent (uses figure four technique to cross legs) Where Assessed-Lower Body Dressing: Edge of bed Toileting: Modified independent Where Assessed-Toileting: Bedside Commode Toilet Transfer: Modified independent Toilet Transfer Method: Arts development officer: Engineer, technical sales Transfer: Modified independent Tub/Shower Transfer Method: Optometrist: Radio broadcast assistant, Energy manager: Not assessed ADL Comments: pt denied bathing at provided opportunities prior to d/c. Pt & mother recieved extensive education on safe adherence to precautions, were provided strategies for use of AE and verbalized understanding. Vision Eye Alignment: Within Functional Limits Ocular Range of Motion: Within Functional Limits Alignment/Gaze Preference: Within Defined Limits Tracking/Visual Pursuits: Able to track stimulus in all quads without difficulty Convergence: Within functional limits Additional Comments: pt reports no  issues with vision, but does not always wear glasses when she should Perception  Perception: Within Functional Limits Praxis Praxis: Intact Cognition Overall Cognitive Status: History of cognitive impairments - at baseline Arousal/Alertness: Awake/alert Orientation Level: Oriented X4 Year: 2022 Month: October Day of Week: Correct Attention: Divided Focused Attention: Appears intact Divided Attention: Appears intact Memory: Impaired Memory Impairment: Retrieval deficit;Decreased short term memory;Decreased recall of new information Decreased Short Term Memory: Verbal basic;Functional basic Immediate Memory Recall: Bed;Blue;Sock Memory Recall Sock: Without Cue Memory Recall Blue: Without Cue Memory Recall Bed: Without Cue Awareness: Appears intact Awareness Impairment: Anticipatory impairment Problem Solving: Impaired Problem Solving Impairment: Functional complex (pt self-limiting during functional tasks) Self Correcting: Appears intact Safety/Judgment: Appears intact Comments: Patient with anxiety at baseline, exaserbated by events surrounding her injuries Sensation Sensation Light Touch: Impaired Detail Peripheral sensation comments: decreased sensation over peroneal nerve distribution Light Touch Impaired Details: Impaired LLE Hot/Cold: Appears Intact Proprioception: Appears Intact Stereognosis: Appears Intact Coordination Gross Motor Movements are Fluid and Coordinated: No. Pt limited by pain in back with UE ROM.  Fine Motor Movements are Fluid and Coordinated: Yes 9 Hole Peg Test: NT (no indicators for FM issues) Motor  Motor Motor: Abnormal postural alignment and control Motor - Skilled Clinical Observations:  (chronic pain in L side (pt c/o frequently about hip, but also with c/o of leg and back pain)) Motor - Discharge Observations: chronic L hip pain (pt c/o pain on L side - back, hip, legs), gen. weakness. Mobility  Bed Mobility Bed Mobility: Rolling  Left;Supine to Sit;Sit to Supine;Rolling Right Rolling Right: Independent with assistive device Rolling Left: Independent with assistive device Supine to Sit: Independent with assistive device Sitting - Scoot to Edge of Bed: Independent with assistive device Sit to Supine: Independent with assistive device (pt is able to perform all bed mobility with bed flat and no bedrails.) Transfers Sit to Stand: Independent with assistive device Stand to Sit: Independent  with assistive device  Trunk/Postural Assessment  Cervical Assessment Cervical Assessment: Exceptions to [facility] (c-collar) Thoracic Assessment Thoracic Assessment: Exceptions to [facility] (TLSO) Lumbar Assessment Lumbar Assessment: Exceptions to WFL (TLSO) Postural Control Postural Control: Deficits on evaluation (decreased/delayed)  Balance Balance Balance Assessed: Yes Static Sitting Balance Static Sitting - Balance Support: Feet supported;No upper extremity supported Static Sitting - Level of Assistance: 7: Independent Dynamic Sitting Balance Dynamic Sitting - Balance Support: Feet supported;No upper extremity supported Dynamic Sitting - Level of Assistance: 6: Modified independent (Device/Increase time) Dynamic Sitting - Balance Activities: Reaching for objects Static Standing Balance Static Standing - Balance Support: During functional activity;Right upper extremity supported;Left upper extremity supported Static Standing - Level of Assistance: 6: Modified independent (Device/Increase time) Dynamic Standing Balance Dynamic Standing - Balance Support: Right upper extremity supported;Left upper extremity supported;During functional activity Dynamic Standing - Level of Assistance: 6: Modified independent (Device/Increase time) Extremity/Trunk Assessment RUE Assessment RUE Assessment: Within Functional Limits LUE Assessment LUE Assessment: Within Functional Limits   Jeanette Rauth 10/23/2021, 2:32 PM

## 2021-10-23 NOTE — Progress Notes (Signed)
Patient ID: Martha Campbell, female   DOB: 10-20-1976, 45 y.o.   MRN: [id number]  SW waiting on updates from [facility] if able to accept LOG. SW sent referral to [facility] care about charity referral and waiting on updates.   SW met with pt in room to inform on challenges with obtaining [facility] since she has to d/c to home with IV abx. Pt was amenable to $24.00 per day but instructed SW to speak with her mother since she is managing all pt finances. SW will f/u once there is a confirmation on [facility].   *SW spoke with pt mother Zigmund Daniel to inform and discuss above. She is amenable to $24/per month with [facility] for IV abx. SW informed will provide updates once there is more information.   SW updated Pam Chandler/[facility] on challenges, and will update if able to get an agency to accept.   Loralee Pacas, MSW, [place] Office: [phone] Cell: [phone] Fax: [phone]

## 2021-10-24 ENCOUNTER — Other Ambulatory Visit (HOSPITAL_COMMUNITY): Payer: Self-pay

## 2021-10-24 LAB — GLUCOSE, CAPILLARY: Glucose-Capillary: 94 mg/dL (ref 70–99)

## 2021-10-24 MED ORDER — ADULT MULTIVITAMIN W/MINERALS CH
1.0000 | ORAL_TABLET | Freq: Every day | ORAL | 0 refills | Status: AC
  Filled 2021-10-24: qty 30, 30d supply, fill #0

## 2021-10-24 MED ORDER — ALBUTEROL SULFATE HFA 108 (90 BASE) MCG/ACT IN AERS
2.0000 | INHALATION_SPRAY | Freq: Four times a day (QID) | RESPIRATORY_TRACT | 2 refills | Status: AC | PRN
  Filled 2021-10-24: qty 8.5, 25d supply, fill #0

## 2021-10-24 MED ORDER — TRAZODONE HCL 50 MG PO TABS
50.0000 mg | ORAL_TABLET | Freq: Every evening | ORAL | 0 refills | Status: AC | PRN
  Filled 2021-10-24: qty 30, 30d supply, fill #0

## 2021-10-24 MED ORDER — CEFAZOLIN SODIUM-DEXTROSE 2-4 GM/100ML-% IV SOLN: 2.0000 g | Freq: Three times a day (TID) | INTRAVENOUS | Status: DC

## 2021-10-24 MED ORDER — FOLIC ACID 1 MG PO TABS
1.0000 mg | ORAL_TABLET | Freq: Every day | ORAL | 0 refills | Status: AC
  Filled 2021-10-24: qty 30, 30d supply, fill #0

## 2021-10-24 MED ORDER — THIAMINE HCL 100 MG PO TABS
100.0000 mg | ORAL_TABLET | Freq: Every day | ORAL | 0 refills | Status: AC
  Filled 2021-10-24: qty 30, 30d supply, fill #0

## 2021-10-24 MED ORDER — MAGIC MOUTHWASH W/LIDOCAINE
1.0000 mL | Freq: Every day | ORAL | 0 refills | Status: AC | PRN
  Filled 2021-10-24: qty 30, 30d supply, fill #0

## 2021-10-24 MED ORDER — OXYCODONE HCL 5 MG PO TABS
5.0000 mg | ORAL_TABLET | Freq: Four times a day (QID) | ORAL | 0 refills | Status: AC | PRN
Start: 2021-10-24 — End: ?
  Filled 2021-10-24: qty 30, 7d supply, fill #0

## 2021-10-24 MED ORDER — CEFAZOLIN SODIUM-DEXTROSE 2-4 GM/100ML-% IV SOLN
2.0000 g | Freq: Three times a day (TID) | INTRAVENOUS | 4 refills | Status: DC
  Filled 2021-10-24: qty 1, 1d supply, fill #0

## 2021-10-24 MED ORDER — METHOCARBAMOL 500 MG PO TABS
1000.0000 mg | ORAL_TABLET | Freq: Three times a day (TID) | ORAL | 0 refills | Status: AC
  Filled 2021-10-24: qty 180, 30d supply, fill #0

## 2021-10-24 MED ORDER — VITAMIN D3 25 MCG PO TABS
1000.0000 [IU] | ORAL_TABLET | Freq: Every day | ORAL | 0 refills | Status: AC
  Filled 2021-10-24: qty 30, 30d supply, fill #0

## 2021-10-24 MED ORDER — CALCIUM CARBONATE ANTACID 500 MG PO CHEW
CHEWABLE_TABLET | Freq: Two times a day (BID) | ORAL | 0 refills | Status: AC
  Filled 2021-10-24: qty 60, 30d supply, fill #0

## 2021-10-24 MED ORDER — ACETAMINOPHEN 325 MG PO TABS
650.0000 mg | ORAL_TABLET | Freq: Two times a day (BID) | ORAL | 0 refills | Status: AC
  Filled 2021-10-24: qty 120, 30d supply, fill #0
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
## 2021-10-24 NOTE — Progress Notes (Signed)
Pt DC at this time via wheelchair-all belongings sent with pt-DC instructions reviewed by Reesa Chew PA with pt-pt stable at time of DC

## 2021-10-24 NOTE — Progress Notes (Addendum)
Patient ID: Martha Campbell, female   DOB: [birth year], 45 y.o.   MRN: [id number]  SW sent out referrals for LOG for IV abx and therapies.   SW waiting on updates from Martha Campbell/[facility] [place] (charity), Martha Campbell/[facility], Martha Campbell/[facility], Martha Campbell/[facility], Martha Campbell/[facility] and Martha Campbell/[facility].  Waiting on updates to see if able to get pt HHSN with [facility] for PICC and labs with LOG.   *Pt approved by TOCNathaniel Campbell for [employer]. SW spoke with Martha Campbell/[facility] to discuss approval. SOC will begin on Monday. Pt medications will be delivered to the home this evening. SW called pt mother Martha Campbell, pt and medical team to inform on updates. Pt enrolled into MATCH medication assistance program.   SW ordered brake extensions to be shipped to her mother's home: Martha Campbell, [place] [id number]  Declined HHAs Martha Campbell/[facility]- unable to accept for charity [facility]- declined Martha Campbell/[facility]- does not accept LOGs Martha Campbell/[facility]- does not accept LOGs [place]/[facility]- unable to accept  Loralee Pacas, MSW, [place] Office: [phone] Cell: [phone] Fax: [phone]

## 2021-10-24 NOTE — Progress Notes (Signed)
[facility] PHYSICAL MEDICINE & REHABILITATION PROGRESS NOTE  Subjective/Complaints: No new complaints. Hopes to go home today.   ROS: Patient denies fever, rash, sore throat, blurred vision, nausea, vomiting, diarrhea, cough, shortness of breath or chest pain, headache, or mood change.   Objective: Vital Signs: Blood pressure 93/69, pulse 67, temperature 97.7 F (36.5 C), resp. rate 16, height $RemoveBe'5\' 5"'IgWvlrLvk$  (1.651 m), weight 51.8 kg, last menstrual period 08/04/2013, SpO2 97 %. MR TIBIA FIBULA LEFT WO CONTRAST  Result Date: 10/22/2021 CLINICAL DATA:  Left lower leg pain. Lytic lesions seen in the fibula on x-ray. EXAM: MRI OF LOWER LEFT EXTREMITY WITHOUT CONTRAST TECHNIQUE: Multiplanar, multisequence MR imaging of the left lower leg was performed. No intravenous contrast was administered. COMPARISON:  Left tibia and fibula x-rays dated September 29, 2021. FINDINGS: Bones/Joint/Cartilage No focal bone lesion. There are few areas of increased wispy T2 hyperintense signal in the cortex of the proximal fibula and tibia with corresponding T1 hyperintense signal, likely reflecting osteoporosis. No fracture or dislocation. Mild degenerative changes of both knees. No joint effusion. Muscles and Tendons Intact. Soft tissue No fluid collection or hematoma.  No soft tissue mass. IMPRESSION: 1. No focal bone lesion. 2. Cortical signal changes in the proximal tibia and fibula corresponding to the lucencies seen on x-ray. These lucencies are also present in the right lower leg and likely reflect underlying osteoporosis. Electronically Signed   By: Titus Dubin M.D.   On: 10/22/2021 16:17   No results for input(s): WBC, HGB, HCT, PLT in the last 72 hours.  No results for input(s): NA, K, CL, CO2, GLUCOSE, BUN, CREATININE, CALCIUM in the last 72 hours.   Intake/Output Summary (Last 24 hours) at 10/24/2021 1121 Last data filed at 10/24/2021 0837 Gross per 24 hour  Intake 650 ml  Output 600 ml  Net 50 ml       Pressure Injury 10/11/21 Buttocks Right;Left Deep Tissue Pressure Injury - Purple or maroon localized area of discolored intact skin or blood-filled blister due to damage of underlying soft tissue from pressure and/or shear. about 1x1 bilateral bottocks  (Active)  10/11/21 0735  Location: Buttocks  Location Orientation: Right;Left  Staging: Deep Tissue Pressure Injury - Purple or maroon localized area of discolored intact skin or blood-filled blister due to damage of underlying soft tissue from pressure and/or shear.  Wound Description (Comments): about 1x1 bilateral bottocks purple color  Present on Admission:      Pressure Injury 10/11/21 Buttocks Right;Left Stage 1 -  Intact skin with non-blanchable redness of a localized area usually over a bony prominence. redness 6x7 bilateral bottocks (Active)  10/11/21 0735  Location: Buttocks  Location Orientation: Right;Left  Staging: Stage 1 -  Intact skin with non-blanchable redness of a localized area usually over a bony prominence.  Wound Description (Comments): redness 6x7 bilateral bottocks  Present on Admission:     Physical Exam: BP 93/69 (BP Location: Left Arm)   Pulse 67   Temp 97.7 F (36.5 C)   Resp 16   Ht $R'5\' 5"'py$  (1.651 m)   Wt 51.8 kg   LMP 08/04/2013   SpO2 97%   BMI 19.00 kg/m   Constitutional: No distress . Vital signs reviewed. HEENT: NCAT, EOMI, oral membranes moist Neck: supple Cardiovascular: RRR without murmur. No JVD    Respiratory/Chest: CTA Bilaterally without wheezes or rales. Normal effort    GI/Abdomen: BS +, non-tender, non-distended Ext: no clubbing, cyanosis, or edema Psych: pleasant and cooperative  Skin: buttocks wounds covered  Neuro: Alert, oriented x 3 Motor: B/l UE: 5/5 proximal to distal RLE:4/5 proximal to distal LLE:HF, KE 4/5, ADF 2/5 with associated pain still present Musc: wearing TLSO and c-collar, fitting appropriately  Assessment/Plan: 1. Functional deficits which require 3+  hours per day of interdisciplinary therapy in a comprehensive inpatient rehab setting. Physiatrist is providing close team supervision and 24 hour management of active medical problems listed below. Physiatrist and rehab team continue to assess barriers to discharge/monitor patient progress toward functional and medical goals   Care Tool:  Bathing  Bathing activity did not occur: Refused Body parts bathed by patient: Right arm, Chest, Left arm, Front perineal area, Abdomen, Buttocks, Left upper leg, Right upper leg, Right lower leg, Left lower leg, Face     Body parts n/a: Front perineal area, Buttocks (pt reported nursing performed peri-care prior to OT)   Bathing assist Assist Level: Independent with assistive device (simulated at shower level during family ed. pt aware to use LH sponge for back and LB. Pt also able to cross legs in figure 4 for feet) Assistive Device Comment: LH sponge   Upper Body Dressing/Undressing Upper body dressing Upper body dressing/undressing activity did not occur (including orthotics): Refused What is the patient wearing?: Pull over shirt, Orthosis (pt provided with strategies to don/doff bra) Orthosis activity level: Performed by helper  Upper body assist Assist Level: Minimal Assistance - Patient > 75% (pt's mother able to doff/don TLSO and pt able to doff TLSO with no A)    Lower Body Dressing/Undressing Lower body dressing    Lower body dressing activity did not occur: Refused What is the patient wearing?: Underwear/pull up, Pants     Lower body assist Assist for lower body dressing: Independent with assitive device Assistive Device Comment: pt provided with strategies for adhering to precautions such as sitting down to button pants   Toileting Toileting    Toileting assist Assist for toileting: Independent with assistive device Assistive Device Comment: RW   Transfers Chair/bed transfer  Transfers assist     Chair/bed transfer assist  level: Independent with assistive device Chair/bed transfer assistive device: Museum/gallery exhibitions officer assist      Assist level: Supervision/Verbal cueing Assistive device: Walker-rolling Max distance: >150 ft   Walk 10 feet activity   Assist     Assist level: Supervision/Verbal cueing Assistive device: Walker-rolling   Walk 50 feet activity   Assist Walk 50 feet with 2 turns activity did not occur: Safety/medical concerns (decreased pain/activity tolerance)  Assist level: Supervision/Verbal cueing Assistive device: Walker-rolling    Walk 150 feet activity   Assist Walk 150 feet activity did not occur: Safety/medical concerns  Assist level: Supervision/Verbal cueing Assistive device: Walker-rolling    Walk 10 feet on uneven surface  activity   Assist Walk 10 feet on uneven surfaces activity did not occur: Safety/medical concerns   Assist level: Contact Guard/Touching assist Assistive device: Walker-rolling   Wheelchair     Assist Is the patient using a wheelchair?: Yes Type of Wheelchair: Manual    Wheelchair assist level: Independent Max wheelchair distance: >300 ft    Wheelchair 50 feet with 2 turns activity    Assist        Assist Level: Independent   Wheelchair 150 feet activity     Assist      Assist Level: Independent    Medical Problem List and Plan: 1.  Deficits with mobility, transfers, self-care secondary to polytrauma.THoracolumbar epidural hematoma  Fxnl  Goals met.   -ELOS today pending coverage of home abx regimen. SW/TOC working on it 2.  Antithrombotics: -DVT/anticoagulation:  Pharmaceutical: Lovenox added 10/22             -antiplatelet therapy:  3. Pain Management: PRN meds  Will be cautious with medications due to history of drug abuse  Patient will require significant encouragement and reassurance  Relatively controlled on 10/27 11/1 robaxin 1000 mg TID- pain much better per pt.   11/2- Decreased oxy to $Remo'5mg'aLKxQ$  q4prn-- no problems 4. Mood: Celexa started on 10/26             -antipsychotic agents: N/A as delirium tremens has resolved.  5. Neuropsych: This patient is capable of making decisions on her own behalf. 6. Skin/Wound Care: Routine pressure relief measures.  7. Fluids/Electrolytes/Nutrition: Monitor I/Os 8. MSSA bacteremia on IV cefazolin for 6 weeks--end date 11/17/21 as endocarditis not ruled out.   -SW working on getting assistance with abx covg at home 9. Fatty liver: Abnormal LFTs resolved except for elevated A phos.    Alk phos improving on 10/26 10. Acute on chronic hyponatremia: Continue to monitor Na with serial checks.              Sodium 136 on 10/31  Continue to monitor 11. Thrombocytosis: Likely reactive.  Platelets 544 on 10/26  10/31- Plts down to 341.  12. Hypoalbuminemia: Albumin level- 2.4.  13. Lytic lesions left fibula. Have reviewed films. Spoke with ortho. This would be an odd presentation for MM given isolated location.   -MRI -demonstrates ?lucencies, likely are osteoporotic  -SPEP, UPEP still pending 11/4  -calcium/vitamin d have been added, f/u with primary 14.  C7 fracture: Aspen collar on at all times.  15. T1, T2  and T11 deformities/fractures/hematoma: TLSO when OOB 16. ABLA: Monitor H/H with serial checks. Hgb 11-12 range. WBC stable.              Hemoglobin 11.1 on 10/26  10/3`- Hb 11.2   17.  Urinary frequency  UA unremarkable, urine culture neg 18.  Left foot drop  PRAFO nightly  Foot up AFO 19.  Vitamin D deficiency  Supplement started on 10/27 20. Constipation  10/30-  bm, con't regimen  LOS: 10 days A FACE TO FACE EVALUATION WAS PERFORMED  Meredith Staggers 10/24/2021, 11:21 AM

## 2021-10-27 DIAGNOSIS — M2580 Other specified joint disorders, unspecified joint: Secondary | ICD-10-CM

## 2021-10-27 DIAGNOSIS — M810 Age-related osteoporosis without current pathological fracture: Secondary | ICD-10-CM

## 2021-10-27 LAB — PROTEIN ELECTROPHORESIS, SERUM
A/G Ratio: 0.8 (ref 0.7–1.7)
Albumin ELP: 2.9 g/dL (ref 2.9–4.4)
Alpha-1-Globulin: 0.4 g/dL (ref 0.0–0.4)
Alpha-2-Globulin: 0.9 g/dL (ref 0.4–1.0)
Beta Globulin: 1.2 g/dL (ref 0.7–1.3)
Gamma Globulin: 1.4 g/dL (ref 0.4–1.8)
Globulin, Total: 3.8 g/dL (ref 2.2–3.9)
Total Protein ELP: 6.7 g/dL (ref 6.0–8.5)

## 2021-10-28 NOTE — Progress Notes (Signed)
Inpatient Rehabilitation Care Coordinator Discharge Note   Patient Details  Name: Martha Campbell MRN: [id number] Date of Birth: 09-04-1976   Discharge location: D/c to home with her mother who will provide intermittent support  Length of Stay: 10 days  Discharge activity level: Mod I  Home/community participation: Limited  Patient response VQ:MGQQPY Literacy - How often do you need to have someone help you when you read instructions, pamphlets, or other written material from your doctor or pharmacy?: Never  Patient response PP:JKDTOI Isolation - How often do you feel lonely or isolated from those around you?: Sometimes  Services provided included: MD, RD, PT, OT, RN, CM, TR, Pharmacy, SW, [employer]  Financial Services:  Charity fundraiser Utilized: Other (Comment) (Self-Pay; Medicaid app to be submitted)    Choices offered to/list presented to: N/A  Follow-up services arranged:  Home Health, Other (Comment) ([facility] for IV abx through 11/28) [place]: [facility] for Picc line and weekly labs    Patient response to transportation need: Is the patient able to respond to transportation needs?: Yes In the past 12 months, has lack of transportation kept you from medical appointments or from getting medications?: No In the past 12 months, has lack of transportation kept you from meetings, work, or from getting things needed for daily living?: No   Comments (or additional information):  Patient/Family verbalized understanding of follow-up arrangements:  Yes  Individual responsible for coordination of the follow-up plan: contact pt [phone]  Confirmed correct DME delivered: Rana Snare 10/28/2021    Rana Snare

## 2021-10-29 ENCOUNTER — Other Ambulatory Visit: Payer: Self-pay

## 2021-10-29 ENCOUNTER — Encounter: Payer: Self-pay | Admitting: Infectious Diseases

## 2021-10-29 ENCOUNTER — Ambulatory Visit (INDEPENDENT_AMBULATORY_CARE_PROVIDER_SITE_OTHER): Payer: Self-pay | Admitting: Infectious Diseases

## 2021-10-29 VITALS — BP 93/67 | HR 98 | Temp 98.2°F

## 2021-10-29 DIAGNOSIS — IMO0001 Reserved for inherently not codable concepts without codable children: Secondary | ICD-10-CM

## 2021-10-29 DIAGNOSIS — Z5181 Encounter for therapeutic drug level monitoring: Secondary | ICD-10-CM

## 2021-10-29 DIAGNOSIS — F1090 Alcohol use, unspecified, uncomplicated: Secondary | ICD-10-CM

## 2021-10-29 DIAGNOSIS — R7881 Bacteremia: Secondary | ICD-10-CM

## 2021-10-29 DIAGNOSIS — B9561 Methicillin susceptible Staphylococcus aureus infection as the cause of diseases classified elsewhere: Secondary | ICD-10-CM

## 2021-10-29 DIAGNOSIS — F17200 Nicotine dependence, unspecified, uncomplicated: Secondary | ICD-10-CM | POA: Insufficient documentation

## 2021-10-29 DIAGNOSIS — Z789 Other specified health status: Secondary | ICD-10-CM

## 2021-10-29 NOTE — Progress Notes (Addendum)
[facility]                                                             [place], [place], [place], [address]                                                                  Phn. [phone]; Fax: [phone]                                                                             Date:  10/29/21  Reason for Referral: HFU for MSSA bacteremia   Assessment MSSA bacteremia, Hospital acquired most likely, cannot r/o endocarditis ( poor TEE candidate) Multiple acute/subacute and chronic spinal fractures  and Epidural hematoma  Alcoholic Liver Disease  Lytic lesions in the Fibula : likely osteoporosis per MRI Medication Monitoring Smoking /Alcohol - counseled   Plan Continue cefazolin as is until 11/17/21 Fu at or around end date 11/17/21 Have requested labs from [facility] with Neurosurgery   All questions and concerns were discussed and addressed. Patient verbalized understanding of the plan. ____________________________________________________________________________________________________________________ HPI/Interval events  Here for HFU for MSSA bacteremia.Getting IV cefazolin from rt arm PICC without any issues. Denies any nausea, vomiting, abdominal pain and diarrhea. Denies rashes. Appetite is OK. Denies any issues with the PICC. She has cut down smoking from 1 and half pack of cigarettes to 4-5 cigarettes a day. Denies alcohol. Staying with her mother currently. She still has her C collar with a body brace, discussed with her she also needs to follow up with [facility].   ROS: 10 point ROS done with pertinent positives and negatives listed above   Past Medical History:  Diagnosis Date   Anxiety    ETOH abuse    Fatty liver    Skin cancer    Withdrawal seizures, with delirium ([place]) 2019   Two different ED visits--incidental finding of SDH on 10/21   Past Surgical History:   Procedure Laterality Date   BASAL CELL CARCINOMA EXCISION  03/2018   Cyst removed from [place] Right 06/16/2018   Procedure: surgical prep scalp wound 16cm2, full thickness skin graft from right arm to scalp;  Surgeon: Irene Limbo, MD;  Location: [facility];  Service: Plastics;  Laterality: Right;   SKIN FULL THICKNESS GRAFT  05/2018   To scalp   Current Outpatient Medications on File Prior to Visit  Medication Sig Dispense Refill   acetaminophen (TYLENOL) 325 MG tablet Take 2 tablets (650 mg total) by mouth 2 (two) times daily. 200 tablet 0   albuterol (VENTOLIN HFA) 108 (90 Base) MCG/ACT inhaler Inhale 2 puffs into the lungs every 6 (six) hours as needed for  wheezing or shortness of breath. 8.5 g 2   calcium carbonate (TUMS - DOSED IN MG ELEMENTAL CALCIUM) 500 MG chewable tablet Chew by mouth 2 (two) times daily with a meal. 60 tablet 0   ceFAZolin (ANCEF) IVPB Inject 2 g into the vein every 8 (eight) hours. Indication:  MSSA Bacteremia First Dose: Yes Last Day of Therapy:  11/17/21 Labs - Once weekly:  CBC/D and BMP, Labs - Every other week:  ESR and CRP Method of administration: IV Push Method of administration may be changed at the discretion of home infusion pharmacist based upon assessment of the patient and/or caregiver's ability to self-administer the medication ordered. 563 Units 0   folic acid (FOLVITE) 1 MG tablet Take 1 tablet (1 mg total) by mouth daily. 100 tablet 0   magic mouthwash w/lidocaine SOLN Take 1 mL by mouth daily as needed for mouth pain. 30 mL 0   methocarbamol (ROBAXIN) 500 MG tablet Take 2 tablets (1,000 mg total) by mouth 3 (three) times daily. 180 tablet 0   Multiple Vitamin (MULTIVITAMIN WITH MINERALS) TABS tablet Take 1 tablet by mouth daily. 100 tablet 0   oxyCODONE (OXY IR/ROXICODONE) 5 MG immediate release tablet Take 1 tablet (5 mg total) by mouth every 6 (six) hours as needed for moderate pain. 30  tablet 0   thiamine 100 MG tablet Take 1 tablet (100 mg total) by mouth daily. 100 tablet 0   traZODone (DESYREL) 50 MG tablet Take 1 tablet (50 mg total) by mouth at bedtime as needed for sleep. 30 tablet 0   Vitamin D3 (VITAMIN D) 25 MCG tablet Take 1 tablet (1,000 Units total) by mouth daily. 100 tablet 0   No current facility-administered medications on file prior to visit.    Allergies  Allergen Reactions   Penicillins Nausea And Vomiting and Swelling    Stomach upset   Penicillins Hives, Nausea And Vomiting and Swelling    fever   Social History   Socioeconomic History   Marital status: Divorced    Spouse name: Not on file   Number of children: Not on file   Years of education: Not on file   Highest education level: Not on file  Occupational History   Not on file  Tobacco Use   Smoking status: Every Day    Packs/day: 2.00    Years: 21.00    Pack years: 42.00    Types: Cigarettes   Smokeless tobacco: Never   Tobacco comments:    Trying to quit smoking  Vaping Use   Vaping Use: Never used  Substance and Sexual Activity   Alcohol use: Yes    Comment: Drinks scotch daily   Drug use: Never   Sexual activity: Yes    Birth control/protection: None  Other Topics Concern   Not on file  Social History Narrative   ** Merged History Encounter **       Social Determinants of Health   Financial Resource Strain: Not on file  Food Insecurity: Not on file  Transportation Needs: Not on file  Physical Activity: Not on file  Stress: Not on file  Social Connections: Not on file  Intimate Partner Violence: Not on file   Family History  Problem Relation Age of Onset   Hypertension Mother    Heart attack Father    Diabetes Maternal Grandmother    Heart attack Paternal Grandfather    Healthy Mother    Healthy Father      Vitals BP [phone]  Pulse 98   Temp 98.2 F (36.8 C) (Oral)   LMP 08/04/2013   SpO2 97%   Examination  General - not in acute distress,  comfortably sitting HEENT- has a c collar and body brace, no pallor and no icterus Chest - respiratory effort normal  CVS- RRR Abdomen - Soft, Non tender , non distended Ext- no pedal edema Neuro: grossly normal Psych : calm and cooperative   Recent labs CBC Latest Ref Rng & Units 10/20/2021 10/17/2021 10/15/2021  WBC 4.0 - 10.5 K/uL 5.1 5.3 6.5  Hemoglobin 12.0 - 15.0 g/dL 11.2(L) 11.0(L) 11.1(L)  Hematocrit 36.0 - 46.0 % 33.4(L) 33.3(L) 33.2(L)  Platelets 150 - 400 K/uL 341 458(H) 544(H)   CMP Latest Ref Rng & Units 10/20/2021 10/15/2021 10/14/2021  Glucose 70 - 99 mg/dL 93 95 95  BUN 6 - 20 mg/dL <5(L) <5(L) 5(L)  Creatinine 0.44 - 1.00 mg/dL 0.44 0.42(L) 0.45  Sodium 135 - 145 mmol/L 136 131(L) 131(L)  Potassium 3.5 - 5.1 mmol/L 3.7 3.6 4.3  Chloride 98 - 111 mmol/L 105 97(L) 93(L)  CO2 22 - 32 mmol/L _0 Calcium 8.9 - 10.3 mg/dL 9.1 8.8(L) 9.2  Total Protein 6.5 - 8.1 g/dL - 6.2(L) -  Total Bilirubin 0.3 - 1.2 mg/dL - 0.7 -  Alkaline Phos 38 - 126 U/L - 176(H) -  AST 15 - 41 U/L - 33 -  ALT 0 - 44 U/L - 18 -    Pertinent Microbiology Results for orders placed or performed during the hospital encounter of 10/14/21  Urine Culture     Status: None   Collection Time: 10/15/21 11:33 AM   Specimen: Urine, Clean Catch  Result Value Ref Range Status   Specimen Description URINE, CLEAN CATCH  Final   Special Requests NONE  Final   Culture   Final    NO GROWTH Performed at [facility], [place] [address], [place], [place] [address]    Report Status 10/16/2021 FINAL  Final    Pertinent Imaging. All pertinent labs/Imagings/notes reviewed. All pertinent plain films and CT images have been personally visualized and interpreted; radiology reports have been reviewed. Decision making incorporated into the Impression / Recommendations.  I have spent a total of 40 minutes of face-to-face and non-face-to-face time, excluding clinical staff time, preparing to see patient,  ordering tests and/or medications, and provide counseling the patient    Electronically signed by:  Rosiland Oz, MD Infectious Disease Physician [facility] [address] [place], [place] [address] Phone: [phone]  Fax: [phone]

## 2021-11-06 ENCOUNTER — Telehealth: Payer: Self-pay

## 2021-11-06 NOTE — Telephone Encounter (Signed)
Jeani Hawking with [facility] called to report that he was notified that the patient's PICC was not working correctly. RN asked if the line was able to infuse or if it was lacking blood return, etc. Jeani Hawking wasn't able to answer this and stated he would contact the home health team for additional information. He said the notes indicate the line is "out some".  Since patient is a charity case he stated they don't typically do cath-flo and was hoping our office could assess the line. RN requested he gather additional information and get back to [place] for next steps.   Edrick Whitehorn Lorita Officer, RN

## 2021-11-07 ENCOUNTER — Inpatient Hospital Stay: Payer: Self-pay | Admitting: Infectious Diseases

## 2021-11-18 ENCOUNTER — Other Ambulatory Visit: Payer: Self-pay

## 2021-11-18 ENCOUNTER — Ambulatory Visit (INDEPENDENT_AMBULATORY_CARE_PROVIDER_SITE_OTHER): Payer: Self-pay | Admitting: Infectious Diseases

## 2021-11-18 ENCOUNTER — Encounter: Payer: Self-pay | Admitting: Infectious Diseases

## 2021-11-18 VITALS — BP 106/74 | HR 101 | Temp 98.6°F | Wt 122.0 lb

## 2021-11-18 DIAGNOSIS — B9561 Methicillin susceptible Staphylococcus aureus infection as the cause of diseases classified elsewhere: Secondary | ICD-10-CM

## 2021-11-18 DIAGNOSIS — F17200 Nicotine dependence, unspecified, uncomplicated: Secondary | ICD-10-CM

## 2021-11-18 DIAGNOSIS — R7881 Bacteremia: Secondary | ICD-10-CM

## 2021-11-18 DIAGNOSIS — IMO0001 Reserved for inherently not codable concepts without codable children: Secondary | ICD-10-CM

## 2021-11-18 DIAGNOSIS — F1090 Alcohol use, unspecified, uncomplicated: Secondary | ICD-10-CM

## 2021-11-18 DIAGNOSIS — Z789 Other specified health status: Secondary | ICD-10-CM

## 2021-11-18 DIAGNOSIS — Z5181 Encounter for therapeutic drug level monitoring: Secondary | ICD-10-CM

## 2021-11-18 NOTE — Progress Notes (Signed)
Per verbal order from Dr. [place], 40 cm PICC removed from right brachial, tip intact. No sutures present. RN confirmed length per chart. Dressing was clean and dry. Insertion site cleaned with CHG. Petroleum dressing applied. Patient advised no heavy lifting with this arm and to leave dressing in place for 24 hours and not to shower affected arm for 24 hours. Advised patient to call office or seek emergency medical care if dressing becomes soaked with blood, swelling, or sharp pain presents. Advised patient to seek emergent care if develops neurological symptoms, chest pain, or shortness of breath. Instructed patient to notify office if they notice redness, warmth, or drainage at the site. Patient verbalized understanding and agreement. RN answered patient's questions. Patient tolerated procedure well and RN walked patient to check out. RN notified [facility] of removal.    Beryle Flock, RN

## 2021-11-18 NOTE — Addendum Note (Signed)
Addended by: Rosiland Oz on: 11/18/2021 10:46 AM   Modules accepted: Level of Service

## 2021-11-18 NOTE — Progress Notes (Signed)
[facility]                                                             [address], [place], [place], [address]                                                                  Phn. [phone]; Fax: [phone]                                                                             Date:  11/18/21  Reason for FU: MSSA bacteremia   Assessment MSSA bacteremia, Hospital acquired most likely, cannot r/o endocarditis ( poor TEE candidate)  Medication Monitoring 11/22 ESR  34, CRP 2   Smoking/alcohol - advised on cutting down  Plan Completed cefazolin on 11/17/21 Will coordinate with [facility] for PICC removal  Follow with Neuro Surgery as instructed  Follow up with PCP for remaining care  Follow up as needed   All questions and concerns were discussed and addressed. Patient verbalized understanding of the plan. ____________________________________________________________________________________________________________________ HPI/Interval events  Here for HFU for MSSA bacteremia.Getting IV cefazolin from rt arm PICC without any issues. Last dose on 11/28. Denies any nausea, vomiting, abdominal pain and diarrhea. Denies rashes. Denies any issues with the PICC. Able to stand without walker briefly and able to work on [employer] briefly. She went to [facility] and appt got  rescheduled to Dec 12 as Dr Franky Macho was in the OR. She is doing well and has no complaints today,   ROS: 10 point ROS done with pertinent positives and negatives listed above   Past Medical History:  Diagnosis Date   Anxiety    ETOH abuse    Fatty liver    Skin cancer    Withdrawal seizures, with delirium (HCC) 2019   Two different ED visits--incidental finding of SDH on 10/21   Past Surgical History:  Procedure Laterality Date   BASAL CELL CARCINOMA EXCISION  03/2018   Cyst removed from Sinuses     SKIN  FULL THICKNESS GRAFT Right 06/16/2018   Procedure: surgical prep scalp wound 16cm2, full thickness skin graft from right arm to scalp;  Surgeon: Glenna Fellows, MD;  Location: [facility];  Service: Plastics;  Laterality: Right;   SKIN FULL THICKNESS GRAFT  05/2018   To scalp   Current Outpatient Medications on File Prior to Visit  Medication Sig Dispense Refill   acetaminophen (TYLENOL) 325 MG tablet Take 2 tablets (650 mg total) by mouth 2 (two) times daily. 200 tablet 0   albuterol (VENTOLIN HFA) 108 (90 Base) MCG/ACT inhaler Inhale 2 puffs into the lungs every 6 (six) hours as needed for wheezing or shortness of breath. 8.5 g 2  calcium carbonate (TUMS - DOSED IN MG ELEMENTAL CALCIUM) 500 MG chewable tablet Chew by mouth 2 (two) times daily with a meal. 60 tablet 0   folic acid (FOLVITE) 1 MG tablet Take 1 tablet (1 mg total) by mouth daily. 100 tablet 0   magic mouthwash w/lidocaine SOLN Take 1 mL by mouth daily as needed for mouth pain. 30 mL 0   methocarbamol (ROBAXIN) 500 MG tablet Take 2 tablets (1,000 mg total) by mouth 3 (three) times daily. 180 tablet 0   Multiple Vitamin (MULTIVITAMIN WITH MINERALS) TABS tablet Take 1 tablet by mouth daily. 100 tablet 0   oxyCODONE (OXY IR/ROXICODONE) 5 MG immediate release tablet Take 1 tablet (5 mg total) by mouth every 6 (six) hours as needed for moderate pain. 30 tablet 0   thiamine 100 MG tablet Take 1 tablet (100 mg total) by mouth daily. 100 tablet 0   traZODone (DESYREL) 50 MG tablet Take 1 tablet (50 mg total) by mouth at bedtime as needed for sleep. 30 tablet 0   Vitamin D3 (VITAMIN D) 25 MCG tablet Take 1 tablet (1,000 Units total) by mouth daily. 100 tablet 0   No current facility-administered medications on file prior to visit.     Allergies  Allergen Reactions   Penicillins Nausea And Vomiting and Swelling    Stomach upset   Penicillins Hives, Nausea And Vomiting and Swelling    fever   Social History    Socioeconomic History   Marital status: Divorced    Spouse name: Not on file   Number of children: Not on file   Years of education: Not on file   Highest education level: Not on file  Occupational History   Not on file  Tobacco Use   Smoking status: Every Day    Packs/day: 2.00    Years: 21.00    Pack years: 42.00    Types: Cigarettes   Smokeless tobacco: Never   Tobacco comments:    Trying to quit smoking  Vaping Use   Vaping Use: Never used  Substance and Sexual Activity   Alcohol use: Yes    Comment: Drinks scotch daily   Drug use: Never   Sexual activity: Yes    Birth control/protection: None  Other Topics Concern   Not on file  Social History Narrative   ** Merged History Encounter **       Social Determinants of Health   Financial Resource Strain: Not on file  Food Insecurity: Not on file  Transportation Needs: Not on file  Physical Activity: Not on file  Stress: Not on file  Social Connections: Not on file  Intimate Partner Violence: Not on file   Family History  Problem Relation Age of Onset   Hypertension Mother    Heart attack Father    Diabetes Maternal Grandmother    Heart attack Paternal Grandfather    Healthy Mother    Healthy Father      Vitals BP 106/74 (BP Location: Left Arm)   Pulse (!) 101   Temp 98.6 F (37 C) (Oral)   Wt 122 lb (55.3 kg)   LMP 08/04/2013   BMI 20.30 kg/m   Examination  General - not in acute distress, comfortably sitting HEENT- has a c collar and body brace, no pallor and no icterus Chest - respiratory effort normal  CVS- RRR Abdomen - Soft, Non tender , non distended Ext- no pedal edema, RT arm PICC OK  Neuro: grossly normal Psych : calm  and cooperative   Recent labs CBC Latest Ref Rng & Units 10/20/2021 10/17/2021 10/15/2021  WBC 4.0 - 10.5 K/uL 5.1 5.3 6.5  Hemoglobin 12.0 - 15.0 g/dL 11.2(L) 11.0(L) 11.1(L)  Hematocrit 36.0 - 46.0 % 33.4(L) 33.3(L) 33.2(L)  Platelets 150 - 400 K/uL 341 458(H)  544(H)   CMP Latest Ref Rng & Units 10/20/2021 10/15/2021 10/14/2021  Glucose 70 - 99 mg/dL 93 95 95  BUN 6 - 20 mg/dL <5(L) <5(L) 5(L)  Creatinine 0.44 - 1.00 mg/dL 0.44 0.42(L) 0.45  Sodium 135 - 145 mmol/L 136 131(L) 131(L)  Potassium 3.5 - 5.1 mmol/L 3.7 3.6 4.3  Chloride 98 - 111 mmol/L 105 97(L) 93(L)  CO2 22 - 32 mmol/L $RemoveB'25 28 28  'GJIozIwL$ Calcium 8.9 - 10.3 mg/dL 9.1 8.8(L) 9.2  Total Protein 6.5 - 8.1 g/dL - 6.2(L) -  Total Bilirubin 0.3 - 1.2 mg/dL - 0.7 -  Alkaline Phos 38 - 126 U/L - 176(H) -  AST 15 - 41 U/L - 33 -  ALT 0 - 44 U/L - 18 -    Pertinent Microbiology Results for orders placed or performed during the hospital encounter of 10/14/21  Urine Culture     Status: None   Collection Time: 10/15/21 11:33 AM   Specimen: Urine, Clean Catch  Result Value Ref Range Status   Specimen Description URINE, CLEAN CATCH  Final   Special Requests NONE  Final   Culture   Final    NO GROWTH Performed at [facility], [place] [address] [place], [place] [address]    Report Status 10/16/2021 FINAL  Final    Pertinent Imaging. All pertinent labs/Imagings/notes reviewed. All pertinent plain films and CT images have been personally visualized and interpreted; radiology reports have been reviewed. Decision making incorporated into the Impression / Recommendations.  I have spent a total of 35  minutes of face-to-face and non-face-to-face time, excluding clinical staff time, preparing to see patient, ordering tests and/or medications, and provide counseling the patient    Electronically signed by:  Rosiland Oz, MD Infectious Disease Physician [facility] [address] [place], [place] [address] Phone: [phone]  Fax: [phone]

## 2023-07-26 ENCOUNTER — Ambulatory Visit
Admission: EM | Admit: 2023-07-26 | Discharge: 2023-07-26 | Disposition: A | Discharge status: Home / Self Care | Payer: 59

## 2023-07-26 DIAGNOSIS — M542 Cervicalgia: Secondary | ICD-10-CM | POA: Diagnosis not present

## 2023-07-26 DIAGNOSIS — S00452A Superficial foreign body of left ear, initial encounter: Secondary | ICD-10-CM | POA: Diagnosis not present
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
